# Patient Record
Sex: Female | Born: 1965 | Race: White | Hispanic: No | Smoking: Former smoker
Health system: Southern US, Community
[De-identification: ages and names within clinical notes are randomized; demographics above are authoritative.]

## PROBLEM LIST (undated history)

## (undated) DIAGNOSIS — M545 Low back pain, unspecified: Secondary | ICD-10-CM

## (undated) DIAGNOSIS — D649 Anemia, unspecified: Secondary | ICD-10-CM

## (undated) DIAGNOSIS — K259 Gastric ulcer, unspecified as acute or chronic, without hemorrhage or perforation: Secondary | ICD-10-CM

## (undated) DIAGNOSIS — K219 Gastro-esophageal reflux disease without esophagitis: Secondary | ICD-10-CM

## (undated) DIAGNOSIS — I209 Angina pectoris, unspecified: Secondary | ICD-10-CM

## (undated) DIAGNOSIS — M1990 Unspecified osteoarthritis, unspecified site: Secondary | ICD-10-CM

## (undated) DIAGNOSIS — R51 Headache: Secondary | ICD-10-CM

## (undated) DIAGNOSIS — R519 Headache, unspecified: Secondary | ICD-10-CM

## (undated) DIAGNOSIS — F419 Anxiety disorder, unspecified: Secondary | ICD-10-CM

## (undated) DIAGNOSIS — G43909 Migraine, unspecified, not intractable, without status migrainosus: Secondary | ICD-10-CM

## (undated) DIAGNOSIS — J45909 Unspecified asthma, uncomplicated: Secondary | ICD-10-CM

## (undated) DIAGNOSIS — G8929 Other chronic pain: Secondary | ICD-10-CM

## (undated) DIAGNOSIS — K5732 Diverticulitis of large intestine without perforation or abscess without bleeding: Secondary | ICD-10-CM

## (undated) HISTORY — PX: TONSILLECTOMY: SUR1361

## (undated) HISTORY — PX: BREAST SURGERY: SHX581

---

## 2006-04-28 ENCOUNTER — Encounter: Admission: RE | Admit: 2006-04-28 | Discharge: 2006-04-28 | Payer: Self-pay | Admitting: Family Medicine

## 2006-04-28 ENCOUNTER — Other Ambulatory Visit: Admission: RE | Admit: 2006-04-28 | Discharge: 2006-04-28 | Payer: Self-pay | Admitting: Obstetrics and Gynecology

## 2007-03-08 ENCOUNTER — Other Ambulatory Visit: Admission: RE | Admit: 2007-03-08 | Discharge: 2007-03-08 | Payer: Self-pay | Admitting: Obstetrics and Gynecology

## 2007-07-02 ENCOUNTER — Emergency Department (HOSPITAL_COMMUNITY): Admission: EM | Admit: 2007-07-02 | Discharge: 2007-07-02 | Payer: Self-pay | Admitting: Emergency Medicine

## 2008-05-04 ENCOUNTER — Emergency Department (HOSPITAL_COMMUNITY): Admission: EM | Admit: 2008-05-04 | Discharge: 2008-05-04 | Payer: Self-pay | Admitting: Emergency Medicine

## 2008-08-04 ENCOUNTER — Inpatient Hospital Stay (HOSPITAL_COMMUNITY): Admission: EM | Admit: 2008-08-04 | Discharge: 2008-08-06 | Payer: Self-pay | Admitting: Emergency Medicine

## 2008-08-05 ENCOUNTER — Encounter (INDEPENDENT_AMBULATORY_CARE_PROVIDER_SITE_OTHER): Payer: Self-pay | Admitting: Internal Medicine

## 2008-08-23 ENCOUNTER — Other Ambulatory Visit: Admission: RE | Admit: 2008-08-23 | Discharge: 2008-08-23 | Payer: Self-pay | Admitting: Obstetrics and Gynecology

## 2008-09-12 ENCOUNTER — Encounter (INDEPENDENT_AMBULATORY_CARE_PROVIDER_SITE_OTHER): Payer: Self-pay | Admitting: Gastroenterology

## 2008-09-12 ENCOUNTER — Ambulatory Visit (HOSPITAL_COMMUNITY): Admission: RE | Admit: 2008-09-12 | Discharge: 2008-09-12 | Payer: Self-pay | Admitting: Gastroenterology

## 2008-12-28 ENCOUNTER — Emergency Department (HOSPITAL_COMMUNITY): Admission: EM | Admit: 2008-12-28 | Discharge: 2008-12-28 | Payer: Self-pay | Admitting: Emergency Medicine

## 2009-01-30 ENCOUNTER — Ambulatory Visit (HOSPITAL_COMMUNITY): Admission: RE | Admit: 2009-01-30 | Discharge: 2009-01-30 | Payer: Self-pay | Admitting: Neurology

## 2009-05-12 ENCOUNTER — Other Ambulatory Visit (HOSPITAL_COMMUNITY): Admission: RE | Admit: 2009-05-12 | Discharge: 2009-05-23 | Payer: Self-pay | Admitting: Psychiatry

## 2009-05-14 ENCOUNTER — Ambulatory Visit: Payer: Self-pay | Admitting: Psychiatry

## 2010-05-12 ENCOUNTER — Ambulatory Visit (HOSPITAL_COMMUNITY): Admission: RE | Admit: 2010-05-12 | Discharge: 2010-05-12 | Payer: Self-pay | Admitting: Obstetrics

## 2010-05-14 ENCOUNTER — Encounter: Admission: RE | Admit: 2010-05-14 | Discharge: 2010-05-14 | Payer: Self-pay | Admitting: Obstetrics

## 2010-07-22 ENCOUNTER — Ambulatory Visit: Payer: Self-pay | Admitting: Interventional Radiology

## 2010-07-22 ENCOUNTER — Emergency Department (HOSPITAL_BASED_OUTPATIENT_CLINIC_OR_DEPARTMENT_OTHER): Admission: EM | Admit: 2010-07-22 | Discharge: 2010-07-22 | Payer: Self-pay | Admitting: Emergency Medicine

## 2010-08-25 ENCOUNTER — Emergency Department (HOSPITAL_COMMUNITY)
Admission: EM | Admit: 2010-08-25 | Discharge: 2010-08-25 | Payer: Self-pay | Source: Home / Self Care | Admitting: Emergency Medicine

## 2011-01-11 ENCOUNTER — Encounter: Payer: Self-pay | Admitting: Gastroenterology

## 2011-01-11 ENCOUNTER — Encounter: Payer: Self-pay | Admitting: Family Medicine

## 2011-03-04 LAB — URINALYSIS, ROUTINE W REFLEX MICROSCOPIC
Bilirubin Urine: NEGATIVE
Hgb urine dipstick: NEGATIVE
Nitrite: NEGATIVE
Protein, ur: NEGATIVE mg/dL
Specific Gravity, Urine: 1.004 — ABNORMAL LOW (ref 1.005–1.030)
Urobilinogen, UA: 0.2 mg/dL (ref 0.0–1.0)

## 2011-03-04 LAB — BASIC METABOLIC PANEL
CO2: 20 mEq/L (ref 19–32)
Calcium: 9.5 mg/dL (ref 8.4–10.5)
Chloride: 109 mEq/L (ref 96–112)
GFR calc Af Amer: >60 mL/min (ref >60)
GFR calc non Af Amer: >60 mL/min (ref >60)

## 2011-03-04 LAB — CBC
HCT: 38.6 % (ref 36.0–46.0)
Hemoglobin: 13.5 g/dL (ref 12.0–15.0)
MCV: 87.7 fL (ref 78.0–100.0)
WBC: 5.6 10*3/uL (ref 4.0–10.5)

## 2011-03-04 LAB — DIFFERENTIAL
Basophils Absolute: 0 10*3/uL (ref 0.0–0.1)
Eosinophils Relative: 1 % (ref 0–5)
Monocytes Absolute: 0.3 10*3/uL (ref 0.1–1.0)

## 2011-03-04 LAB — HEPATIC FUNCTION PANEL
ALT: 24 U/L (ref 0–35)
Bilirubin, Direct: 0.7 mg/dL — ABNORMAL HIGH (ref 0.0–0.3)
Indirect Bilirubin: 1.1 mg/dL — ABNORMAL HIGH (ref 0.3–0.9)

## 2011-03-05 LAB — COMPREHENSIVE METABOLIC PANEL
ALT: 20 U/L (ref 0–35)
Albumin: 4.2 g/dL (ref 3.5–5.2)
Alkaline Phosphatase: 43 U/L (ref 39–117)
CO2: 24 mEq/L (ref 19–32)
Calcium: 9.6 mg/dL (ref 8.4–10.5)
Chloride: 109 mEq/L (ref 96–112)
Potassium: 4 mEq/L (ref 3.5–5.1)
Total Bilirubin: 0.7 mg/dL (ref 0.3–1.2)
Total Protein: 7.4 g/dL (ref 6.0–8.3)

## 2011-03-05 LAB — DIFFERENTIAL
Eosinophils Absolute: 0 10*3/uL (ref 0.0–0.7)
Lymphocytes Relative: 26 % (ref 12–46)
Lymphs Abs: 1.6 10*3/uL (ref 0.7–4.0)
Monocytes Relative: 5 % (ref 3–12)
Neutro Abs: 4 10*3/uL (ref 1.7–7.7)
Neutrophils Relative %: 68 % (ref 43–77)

## 2011-03-05 LAB — CBC
HCT: 40.8 % (ref 36.0–46.0)
Hemoglobin: 14.3 g/dL (ref 12.0–15.0)
MCH: 31.1 pg (ref 26.0–34.0)
MCHC: 34.9 g/dL (ref 30.0–36.0)
MCV: 89.1 fL (ref 78.0–100.0)
Platelets: 216 10*3/uL (ref 150–400)
RDW: 12.3 % (ref 11.5–15.5)
WBC: 5.9 10*3/uL (ref 4.0–10.5)

## 2011-03-05 LAB — URINALYSIS, ROUTINE W REFLEX MICROSCOPIC
Glucose, UA: NEGATIVE mg/dL
Nitrite: NEGATIVE
Protein, ur: NEGATIVE mg/dL
Urobilinogen, UA: 0.2 mg/dL (ref 0.0–1.0)
pH: 7 (ref 5.0–8.0)

## 2011-03-05 LAB — PREGNANCY, URINE: Preg Test, Ur: NEGATIVE

## 2011-03-05 LAB — LIPASE, BLOOD: Lipase: 34 U/L (ref 23–300)

## 2011-04-05 LAB — DIFFERENTIAL
Basophils Absolute: 0 10*3/uL (ref 0.0–0.1)
Basophils Relative: 0 % (ref 0–1)
Eosinophils Absolute: 0 10*3/uL (ref 0.0–0.7)
Eosinophils Relative: 0 % (ref 0–5)
Monocytes Absolute: 0.5 10*3/uL (ref 0.1–1.0)

## 2011-04-05 LAB — URINALYSIS, ROUTINE W REFLEX MICROSCOPIC
Glucose, UA: NEGATIVE mg/dL
Ketones, ur: NEGATIVE mg/dL
Nitrite: NEGATIVE
pH: 7 (ref 5.0–8.0)

## 2011-04-05 LAB — COMPREHENSIVE METABOLIC PANEL
ALT: 27 U/L (ref 0–35)
AST: 26 U/L (ref 0–37)
Albumin: 4.4 g/dL (ref 3.5–5.2)
Alkaline Phosphatase: 34 U/L — ABNORMAL LOW (ref 39–117)
CO2: 24 mEq/L (ref 19–32)
Chloride: 106 mEq/L (ref 96–112)
GFR calc Af Amer: >60 mL/min (ref >60)
Potassium: 3.7 mEq/L (ref 3.5–5.1)
Total Bilirubin: 1.1 mg/dL (ref 0.3–1.2)

## 2011-04-05 LAB — CBC
HCT: 40.4 % (ref 36.0–46.0)
Platelets: 288 10*3/uL (ref 150–400)
RBC: 4.34 MIL/uL (ref 3.87–5.11)
WBC: 9.1 10*3/uL (ref 4.0–10.5)

## 2011-04-05 LAB — URINE MICROSCOPIC-ADD ON

## 2011-04-05 LAB — RAPID URINE DRUG SCREEN, HOSP PERFORMED
Amphetamines: NOT DETECTED
Benzodiazepines: POSITIVE — AB

## 2011-04-05 LAB — ETHANOL: Alcohol, Ethyl (B): <5 mg/dL (ref 0–10)

## 2011-04-06 LAB — COMPREHENSIVE METABOLIC PANEL
ALT: 28 U/L (ref 0–35)
BUN: 11 mg/dL (ref 6–23)
CO2: 31 mEq/L (ref 19–32)
Calcium: 9.2 mg/dL (ref 8.4–10.5)
Creatinine, Ser: 0.71 mg/dL (ref 0.4–1.2)
GFR calc non Af Amer: >60 mL/min (ref >60)
Glucose, Bld: 104 mg/dL — ABNORMAL HIGH (ref 70–99)
Sodium: 142 mEq/L (ref 135–145)

## 2011-04-06 LAB — VALPROIC ACID LEVEL: Valproic Acid Lvl: 50.3 ug/mL (ref 50.0–100.0)

## 2011-05-04 NOTE — Procedures (Signed)
EEG NUMBER:   HISTORY:  A 45 year old female, was admitted for diarrhea, abdominal  pain, dehydration, syncope, history of depression, and asthma.   CURRENT MEDICATIONS:  Zoloft, Protonix, Neurontin, Restoril, Cipro,  Flagyl, Xanax, Darvocet, Zofran, Tylenol, Dilaudid, and Benadryl.   TECHNICAL COMMENT:  An 18-channel EEG was performed based on standard  International 10-20 system.  Upon awakening, the posterior background  activity was well developed, symmetric, 10 Hz, reactive to eye opening  and closure.  There was no epileptiform discharge recorded.   Hyperventilation and photic stimulation was not performed.   The patient was drowsy during the recording, but no deeper stage of  sleep was achieved.   A 17-channel was added to EKG, which has demonstrated a normal sinus  rhythm of 84 beats per minute.  Total recording time was 22.1 minutes.   CONCLUSION:  This is a normal awake EEG.  There is no evidence of  epileptiform discharge.      Levert Feinstein, MD  Electronically Signed     EA:[id number]  D:  08/06/2008 16:15:28  T:  08/07/2008 12:40:40  Job #:  [id number]

## 2011-05-04 NOTE — Consult Note (Signed)
NAMEDONALYN, SCHNEEBERGER               ACCOUNT NO.:  [id number]   MEDICAL RECORD NO.:  [id number]          PATIENT TYPE:  INP   LOCATION:  1431                         FACILITY:  [facility]   PHYSICIAN:  Antonietta Breach, M.D.  DATE OF BIRTH:  07/21/1966   DATE OF CONSULTATION:  08/05/2008  DATE OF DISCHARGE:                                 CONSULTATION   REFERRING PHYSICIAN:  [facility], [facility].   REASON FOR CONSULTATION:  Severe anxiety, somatic concerns without  identifiable physiologic etiology.   HISTORY OF PRESENT ILLNESS:  Mrs. Burggraf is a 45 year old married  female admitted to the [facility] on August 03, 2008 with  diarrhea, abdominal pain, weakness and dehydration, as well as chest  pain.   Mrs. Cobin discusses several weeks of much worry, insomnia, feeling on  edge and muscle tension regarding the stress of identity thief.   She also identifies stresses, having diarrhea and suspecting that she  has parasites in her stool.  She also states that she is very worried  about her heart.  She states that she had an echocardiogram a number of  years ago that described valve problems.  She is concerned that they  have worsened.   Mrs. Mahmood does describe normal interests, intact hope and future  goals.  She does not have any thoughts of harming herself or others.  She has no delusions or hallucinations   PAST PSYCHIATRIC HISTORY:  Mrs. Lemar does describe a history of major  depression with response to Zoloft.   In review of the past medical record, depression is listed.  Also prior  to admission, Mrs. Fiumara had been restarted on Zoloft 25 mg per day  during the week prior to admission.  Her diarrhea had already been  occurring for a number of weeks before the Zoloft was started.   She has been receiving Restoril p.r.n. for insomnia.  She states that  Benadryl helps.   FAMILY PSYCHIATRIC HISTORY:  None known.   SOCIAL HISTORY:  Mrs. Dierolf states that she and  her husband are  receiving marital counseling at her church.  She does not use alcohol or  illegal drugs.  She has a child.  Please see the history above.   PAST MEDICAL HISTORY:  Please see the above.  Stool for ova and  parasites was negative.  Mrs. Isidoro has a history of asthma.   MEDICATIONS:  The MAR is reviewed.  Mrs. Openshaw is on Neurontin 100 mg  t.i.d.  She is on Zoloft 25 mg daily.  Restoril 7.5 mg nightly p.r.n.,  Xanax 0.25 mg q.8 h. p.r.n.   LABORATORY DATA:  Sodium 140,  potassium 3.5,  BUN 4, creatinine 0.8,  glucose 100 WBC 4.2, hemoglobin 10.7, platelet count 152, SGOT 19, SGPT  17, HCG negative.  Alcohol negative.   ALLERGIES:  CODEINE, MORPHINE SULFATE, DILAUDID.   REVIEW OF SYSTEMS:  CONSTITUTIONAL,  HEAD, EYES, EARS, NOSE AND THROAT,  MOUTH, NEUROLOGIC, PSYCHIATRIC CARDIOVASCULAR, RESPIRATORY,  GASTROINTESTINAL, GENITOURINARY, SKIN, MUSCULOSKELETAL, HEMATOLOGIC,  LYMPHATIC, ENDOCRINE, METABOLIC:  All unremarkable.   PHYSICAL EXAMINATION:  VITAL SIGNS:  Temperature 98.8, pulse 90,  respiratory rate 20, blood pressure 102/67, O2 saturation on room air  100%.  GENERAL APPEARANCE:  Mrs. Heimann is a middle-aged female reclining in  her hospital bed in a supine position with no abnormal involuntary  movements.   MENTAL STATUS EXAM:  Mrs. of Arlys John is alert.  Her eye contact is good.  She is oriented to all spheres.   Her concentration is within normal limits.  Her affect is anxious.  Her  mood is anxious.  Her memory is intact to immediate, recent and remote.  Her fund of knowledge and intelligence are within normal limits.  Her  speech involves normal rate and prosody.   REVIEW OF SYSTEMS:  71 without dysarthria.   Thought process is logical, coherent, goal-directed.  No looseness of  associations.  Thought content no thoughts of harming herself, no  thoughts of harming others no delusions or hallucinations.  Her insight  is partial for anxiety.  Judgment  is within normal limits.   ASSESSMENT:  AXIS I:  293.84.  Anxiety disorder not otherwise specified.  Generalized anxiety disorder, rule out panic disorder.  AXIS II:  Deferred.  AXIS III:  See past medical history.  AXIS IV:  Primary support group.  AXIS V:  55.   Mrs. Stofer is not at risk to harm to self or others.  She agrees to  call emergency services immediately for any thoughts of harming herself,  thoughts of harming others, or distress.   The undersigned provided ego supportive psychotherapy and education.   The indications, alternatives and adverse effects of Zoloft and Xanax as  well as Restoril and Benadryl were discussed with the patient including  the risk of dependence.   Mrs. O'Brien understands.  She would like to proceed as below.   Would titrate the Zoloft by 25 mg per week, as tolerated to a trial dose  of 100 mg q.a.m. for antianxiety, with the goal of Mrs. O'Brien  eventually not needing any benzodiazepine therapy.   Mrs. Wilms does not want to use Xanax for her anti-insomnia agent.  She would prefer to utilize Benadryl 25 to 50 mg nightly, would utilize  the Restoril only if needed.   Mrs. Fiscus understands to not drive if drowsy.   Would utilize Xanax at 0.25 to 1.5 mg t.i.d. p.r.n. severe anxiety.   RECOMMENDATIONS:  Would enroll Mrs. O'Brien in psychotherapy, as well as  psychotropic medication management with a therapist and psychiatrist of  her choice.   Would set this up during the first week of discharge.  Her psychotherapy  can involve cognitive behavioral therapy with deep breathing and  progressive muscle relaxation training, coping skills and stress  management can also be applied.   A psychiatrist can provide psychotropic medication management with the  goal of the eliminating all benzodiazepine therapy eventually.      Antonietta Breach, M.D.  Electronically Signed     JW/MEDQ  D:  08/05/2008  T:  08/05/2008  Job:  [id number]

## 2011-05-04 NOTE — Op Note (Signed)
NAMEJOYDAN, Deborah Pena               ACCOUNT NO.:  [id number]   MEDICAL RECORD NO.:  [id number]          PATIENT TYPE:  AMB   LOCATION:  ENDO                         FACILITY[facility]  [facility]   PHYSICIAN:  Shirley Friar, MDDATE OF BIRTH:  [birth year]   DATE OF PROCEDURE:  DATE OF DISCHARGE:  09/12/2008                               OPERATIVE REPORT   PROCEDURE:  Colonoscopy.   INDICATIONS:  Diarrhea, rectal bleeding, weight loss.   MEDICATIONS:  Per Anesthesia; propofol 180 mg IV, fentanyl 150 mcg,  Versed 4 mg IV.   FINDINGS:  Rectal exam was normal.   DESCRIPTION OF PROCEDURE:  A pediatric Pentax colonoscope was inserted  into a fair prepped colon and advanced to the cecum where the ileocecal  valve and appendiceal orifice were identified.  The terminal ileum was  intubated and was normal in appearance.  Biopsies were taken in the  terminal ileum for histology.  On careful withdrawal of the colonoscope,  random biopsies were taken in the colon.  Retroflexion was normal.   ASSESSMENT:  Normal colonoscopy, status post biopsies of colon and  terminal ileum for histology.      Shirley Friar, MD  Electronically Signed     VCS/MEDQ  D:  09/12/2008  T:  09/14/2008  Job:  [id number]

## 2011-05-04 NOTE — H&P (Signed)
NAMEMANDA, Deborah Pena               ACCOUNT NO.:  [id number]   MEDICAL RECORD NO.:  [id number]          PATIENT TYPE:  INP   LOCATION:  1431                         FACILITY:  [facility]   PHYSICIAN:  Michelene Gardener, MD    DATE OF BIRTH:  Nov 04, 1966   DATE OF ADMISSION:  08/04/2008  DATE OF DISCHARGE:                              HISTORY & PHYSICAL   PRIMARY CARE PHYSICIAN:  Unassigned.   CHIEF COMPLAINT:  Diarrhea for 3 weeks, abdominal pain and chest pain  and near syncope.   HISTORY OF PRESENT ILLNESS:  This is 45 year old, Caucasian female with  past medical history of depression and asthma, presented with the above-  mentioned complaint.  The patient has had diarrhea for the last 3 weeks,  first 2 weeks she got at least 8-9 episodes of diarrhea.  For the last  one week, she has been having more frequent diarrhea where she got it  approximately every half an hour.  Initially she was watery diarrhea.  Now it has become more formed, and she had some blood on it.  She was  also complaining of abdominal pain especially in the left lower  quadrant.  She has been having nausea, but there is no vomiting.  Today,  she was feeling more palpitations.  She develops chest pain.  She was  also almost passed out where she collapsed, but never lost her  consciousness, and that happened twice, yesterday and today.  After her  collapse today, 9-1-1 was called by her mom and the patient was brought  to the hospital for further evaluation.   PAST MEDICAL HISTORY:  1. Asthma.  2. Allergic seasonal allergies.   PAST SURGICAL HISTORY:  Denied.   ALLERGIES:  CODEINE AND MORPHINE.   CURRENT MEDICATIONS:  1. Zoloft 25 mg once a day.  2. Restoril as needed.  3. Neurontin 100 mg twice daily.   SOCIAL HISTORY:  She quit smoking around 10 years ago.  She denies  alcohol, drinking.  Denies recreational drugs.   FAMILY HISTORY:  Her mother is deaf and has hypertension.  She does not  know anything  about her dad's history.   REVIEW OF SYSTEMS:  Twelve point systems were reviewed and they were  negative and explained as per HPI.   PHYSICAL EXAMINATION:  VITAL SIGNS: Temperature is 98.9, blood pressure  is 120/86, pulse 84, respiratory rate 16.  GENERAL APPEARANCE:  This is a middle-aged Caucasian female not in acute  distress.  HEENT:  Conjunctivae are pink.  Her pupils are equal and reactive to  light and accommodations.  There is no ptosis.  Hearing is intact.  There is no ear discharge or infection.  There is no nose infection or  bleeding.  Oral mucosa dry.  No pharyngeal erythema.  NECK:  Supple, no JVD, no carotid bruit, no lymphadenopathy.  No thyroid  enlargement or thyroid tenderness.  CARDIOVASCULAR:  S1, S2 regular.  There is no murmurs.  No gallops or  rubs.  RESPIRATORY:  The patient is breathing between 16-18.  There are no  rales, rhonchi or wheezes.  ABDOMEN:  Soft, not distended. No tenderness, no  hepatosplenomegaly.Bowel sounds are present.  LOWER EXTREMITIES:  No edema or varicose veins.  SKIN:  No rash and erythema.  NEUROLOGICAL:  Cranial nerves are intact.  No motor or sensory deficits.   LABORATORY DATA:  WBCs 5.5, hemoglobin at 12.8, hematocrit 37.3, MCV  92.1, platelet count is 219.  Sodium 142, potassium 3.6, chloride 108,  bicarb 27, glucose 95, BUN 3, creatinine 0.81, total protein 6.8.   IMPRESSION:  1. Diarrhea.  2. Abdominal pain.  3. Dehydration.  4. Chest pain and palpitations.  5. Near syncope.  6. Depression.  7. Anxiety.   PLAN:  1. Diarrhea.  This patient has had diarrhea for 3 weeks, but she does      not know why.  In the last one, she has been having more frequent      diarrhea.  She started to develop some pain with it.  I will keep      her n.p.o.  Will start her on IV fluids.  Will get CT scan of her      abdomen and pelvis.  I will start her on ciprofloxacin 400 mg q.12      hours.  Will start her also Flagyl 500 mg q.8 h.   Will send for      stool studies including stool culture, stool ova and parasite and      stool C. difficile.  2. Abdominal pain.  Etiology is unclear at this point.  I will get a      CT scan of the abdomen and pelvis for further evaluation.  3. Dehydration.  Continue on IV Fluids.  4. Chest pain, now palpitations.  As per the patient's history, has      history of 2 leaking valves in the past.  She was given a      prescription by her doctor for that as she has been noncompliant.      Now, she has been having more chest pain in the last 2 days which      was not very specific.  I will get three sets of troponin and      cardiac enzymes.  Will do echocardiogram.  5. Near-syncope.  This is most likely secondary to transient      hypotension with her excessive loss of fluids through the diarrhea.      Will just resuscitate her with IV fluids and will follow her in the      hospital.  6. Depression/Anxiety.  Will continue current medications.   ASSESSMENT TIME:  One hour.      Michelene Gardener, MD  Electronically Signed     NAE/MEDQ  D:  08/04/2008  T:  08/04/2008  Job:  [id number]

## 2011-05-04 NOTE — Op Note (Signed)
Deborah Pena, Deborah Pena               ACCOUNT NO.:  [id number]   MEDICAL RECORD NO.:  [id number]          PATIENT TYPE:  AMB   LOCATION:  ENDO                         [facility]ACILITY:  [facility]   PHYSICIAN:  Shirley Friar, MDDATE OF BIRTH:  [birth year]   DATE OF PROCEDURE:  09/12/2008  DATE OF DISCHARGE:  09/12/2008                               OPERATIVE REPORT   PROCEDURE:  Upper endoscopy dictation.   INDICATIONS:  Diarrhea, rectal bleeding, weight loss.   MEDICATIONS:  See preceding colonoscopy.  IV sedation was given by  Anesthesia.   FINDINGS:  Endoscope was inserted through oropharynx and esophagus was  intubated which was normal in its entirety.  Endoscope was passed down  into the stomach which revealed normal stomach mucosa.  Retroflexion  done which revealed normal proximal stomach.  The scope was straightened  and advanced to the duodenal bulb and down to the second portion of  duodenum where there was noted beats scattered edematous folds.  There  were biopsied.  The endoscope was withdrawn back to the duodenal bulb  there was some edema in the distal bulb as well and biopsy was taken for  the histology.  Endoscope was withdrawn to confirm the above findings.   ASSESSMENT:  1. Scattered edematous folds in examined duodenum, status post biopsy      for histology.  2. Otherwise normal upper endoscopy.   PLAN:  Follow up on path.      Shirley Friar, MD  Electronically Signed     VCS/MEDQ  D:  09/12/2008  T:  09/14/2008  Job:  [id number]

## 2011-05-04 NOTE — Consult Note (Signed)
NAMEMARIXA, Deborah Pena               ACCOUNT NO.:  [id number]   MEDICAL RECORD NO.:  [id number]          PATIENT TYPE:  INP   LOCATION:  1431                         FACILITY:  [facility]   PHYSICIAN:  Antonietta Breach, M.D.  DATE OF BIRTH:  Oct 16, 1966   DATE OF CONSULTATION:  08/06/2008  DATE OF DISCHARGE:                                 CONSULTATION   HISTORY OF PRESENT ILLNESS:  Deborah Pena is less anxious today.  She is  cooperative with bedside care.  She underwent an electroencephalogram  this morning.   She has less feeling on edge.  She is also less worried.  She is  discussing hope and interests.   REVIEW OF SYSTEMS:  GASTROINTESTINAL:  She is not having any diarrhea.  She had 10 mL of loose stool.  She is not having any complaints of  nausea.  CARDIOVASCULAR:  There have been no syncopal episodes or  symptoms.   PSYCHIATRIC:  Deborah Pena provide additional past psychiatric history.  She reports that 50 mg of Zoloft per day helped with her premenstrual  syndrome symptoms.  Also, she underwent a trial of venlafaxine titrated  to 225 mg daily which resulted in increasing her anxiety and not helping  her.   PHYSICAL EXAMINATION:  VITAL SIGNS:  Temperature 97.6, pulse 72,  respiratory rate 18, blood pressure 102/62, O2 saturation on room air  97%.   MENTAL STATUS EXAM:  Deborah Pena is alert.  Her eye contact is good.  She is oriented to all spheres.  Affect slightly anxious.  Mood slightly  anxious, but interests and hope intact.  Memory is intact to immediate  recent and remote.  Speech is normal.  Thought process logical,  coherent, goal-directed.  No looseness of associations.  Thought  content:  No thoughts of harming others no thoughts of harming herself,  no delusions, no hallucinations.  Insight is intact for her anxiety  symptoms.  Judgment is intact.   ASSESSMENT:  AXIS I:  293.84 anxiety disorder not otherwise specified,  generalized anxiety disorder.   The  undersigned provided ego supportive psychotherapy and education.   RECOMMENDATIONS:  Would continue with the Zoloft 25 mg daily and titrate  by 25 mg per week to the initial target dose of 100 mg daily which when  combined with cognitive behavioral therapy, deep breathing and  progressive muscle relaxation will have the best chance of eliminating  her need for benzodiazepine therapy over the long-term.   Deborah Pena declines the recommendation for intensive outpatient  psychiatric programs.  She wants to continue with her current  psychiatrist and counselor for follow up.      Antonietta Breach, M.D.  Electronically Signed     JW/MEDQ  D:  08/06/2008  T:  08/06/2008  Job:  [id number]

## 2011-10-05 LAB — DIFFERENTIAL
Basophils Relative: 0
Eosinophils Absolute: 0.1
Monocytes Absolute: 0.4
Monocytes Relative: 8
Neutrophils Relative %: 53

## 2011-10-05 LAB — COMPREHENSIVE METABOLIC PANEL
ALT: 16
Albumin: 4.7
Alkaline Phosphatase: 23 — ABNORMAL LOW
Glucose, Bld: 85
Potassium: 4
Sodium: 141
Total Protein: 7.2

## 2011-10-05 LAB — URINALYSIS, ROUTINE W REFLEX MICROSCOPIC
Bilirubin Urine: NEGATIVE
Glucose, UA: NEGATIVE
Hgb urine dipstick: NEGATIVE
Ketones, ur: NEGATIVE
pH: 7.5

## 2011-10-05 LAB — CBC
Hemoglobin: 14
RDW: 12.2
WBC: 4.7

## 2011-10-06 ENCOUNTER — Emergency Department (HOSPITAL_COMMUNITY)
Admission: EM | Admit: 2011-10-06 | Discharge: 2011-10-06 | Disposition: A | Discharge status: Home / Self Care | Payer: Managed Care, Other (non HMO) | Attending: Emergency Medicine | Admitting: Emergency Medicine

## 2011-10-06 DIAGNOSIS — R112 Nausea with vomiting, unspecified: Secondary | ICD-10-CM | POA: Insufficient documentation

## 2011-10-06 DIAGNOSIS — R51 Headache: Secondary | ICD-10-CM | POA: Insufficient documentation

## 2011-12-21 HISTORY — PX: ABDOMINAL HYSTERECTOMY: SHX81

## 2011-12-21 HISTORY — PX: OOPHORECTOMY: SHX86

## 2011-12-21 HISTORY — PX: BUNIONECTOMY: SHX129

## 2012-05-26 ENCOUNTER — Ambulatory Visit (HOSPITAL_COMMUNITY): Payer: Managed Care, Other (non HMO) | Admitting: Psychiatry

## 2012-07-03 DIAGNOSIS — F1310 Sedative, hypnotic or anxiolytic abuse, uncomplicated: Secondary | ICD-10-CM | POA: Insufficient documentation

## 2012-08-21 ENCOUNTER — Ambulatory Visit (INDEPENDENT_AMBULATORY_CARE_PROVIDER_SITE_OTHER): Payer: Managed Care, Other (non HMO) | Admitting: Family Medicine

## 2012-08-21 ENCOUNTER — Emergency Department (HOSPITAL_COMMUNITY): Payer: Managed Care, Other (non HMO)

## 2012-08-21 ENCOUNTER — Emergency Department (HOSPITAL_COMMUNITY)
Admission: EM | Admit: 2012-08-21 | Discharge: 2012-08-21 | Disposition: A | Discharge status: Home / Self Care | Payer: Managed Care, Other (non HMO) | Attending: Emergency Medicine | Admitting: Emergency Medicine

## 2012-08-21 ENCOUNTER — Encounter (HOSPITAL_COMMUNITY): Payer: Self-pay

## 2012-08-21 VITALS — BP 110/86 | HR 100 | Temp 97.8°F | Resp 16 | Ht 63.0 in | Wt 235.0 lb

## 2012-08-21 DIAGNOSIS — R109 Unspecified abdominal pain: Secondary | ICD-10-CM

## 2012-08-21 DIAGNOSIS — M549 Dorsalgia, unspecified: Secondary | ICD-10-CM | POA: Insufficient documentation

## 2012-08-21 DIAGNOSIS — R350 Frequency of micturition: Secondary | ICD-10-CM

## 2012-08-21 DIAGNOSIS — IMO0001 Reserved for inherently not codable concepts without codable children: Secondary | ICD-10-CM

## 2012-08-21 DIAGNOSIS — R10819 Abdominal tenderness, unspecified site: Secondary | ICD-10-CM | POA: Insufficient documentation

## 2012-08-21 DIAGNOSIS — R110 Nausea: Secondary | ICD-10-CM | POA: Insufficient documentation

## 2012-08-21 HISTORY — DX: Diverticulitis of large intestine without perforation or abscess without bleeding: K57.32

## 2012-08-21 LAB — BASIC METABOLIC PANEL
Calcium: 9.3 mg/dL (ref 8.4–10.5)
GFR calc Af Amer: >90 mL/min (ref >90)
GFR calc non Af Amer: 86 mL/min — ABNORMAL LOW (ref >90)
Potassium: 4 mEq/L (ref 3.5–5.1)
Sodium: 141 mEq/L (ref 135–145)

## 2012-08-21 LAB — POCT URINALYSIS DIPSTICK
Bilirubin, UA: NEGATIVE
Blood, UA: NEGATIVE
Glucose, UA: NEGATIVE
Ketones, UA: NEGATIVE
Leukocytes, UA: NEGATIVE
Nitrite, UA: NEGATIVE
Protein, UA: NEGATIVE
Spec Grav, UA: <=1.005
Urobilinogen, UA: 0.2
pH, UA: 7

## 2012-08-21 LAB — URINALYSIS, ROUTINE W REFLEX MICROSCOPIC
Nitrite: NEGATIVE
Protein, ur: NEGATIVE mg/dL
Specific Gravity, Urine: 1.003 — ABNORMAL LOW (ref 1.005–1.030)
Urobilinogen, UA: 0.2 mg/dL (ref 0.0–1.0)

## 2012-08-21 LAB — CBC WITH DIFFERENTIAL/PLATELET
Basophils Absolute: 0 10*3/uL (ref 0.0–0.1)
Basophils Relative: 0 % (ref 0–1)
Hemoglobin: 13.3 g/dL (ref 12.0–15.0)
MCHC: 33.8 g/dL (ref 30.0–36.0)
Neutro Abs: 3.5 10*3/uL (ref 1.7–7.7)
Neutrophils Relative %: 58 % (ref 43–77)
Platelets: 220 10*3/uL (ref 150–400)
RDW: 12.4 % (ref 11.5–15.5)

## 2012-08-21 LAB — POCT UA - MICROSCOPIC ONLY
Casts, Ur, LPF, POC: NEGATIVE
Crystals, Ur, HPF, POC: NEGATIVE
Mucus, UA: NEGATIVE
RBC, urine, microscopic: NEGATIVE
WBC, Ur, HPF, POC: NEGATIVE
Yeast, UA: NEGATIVE

## 2012-08-21 LAB — POCT GLYCOSYLATED HEMOGLOBIN (HGB A1C): Hemoglobin A1C: 5.1

## 2012-08-21 MED ORDER — SODIUM CHLORIDE 0.9 % IV BOLUS (SEPSIS)
1000.0000 mL | Freq: Once | INTRAVENOUS | Status: AC
  Administered 2012-08-21: 1000 mL via INTRAVENOUS

## 2012-08-21 MED ORDER — FENTANYL CITRATE 0.05 MG/ML IJ SOLN
50.0000 ug | Freq: Once | INTRAMUSCULAR | Status: AC
  Administered 2012-08-21: 50 ug via INTRAVENOUS
  Filled 2012-08-21 (×2): qty 2

## 2012-08-21 MED ORDER — FENTANYL CITRATE 0.05 MG/ML IJ SOLN
50.0000 ug | Freq: Once | INTRAMUSCULAR | Status: AC
  Administered 2012-08-21: 50 ug via INTRAVENOUS
  Filled 2012-08-21: qty 2

## 2012-08-21 MED ORDER — ONDANSETRON HCL 4 MG/2ML IJ SOLN
4.0000 mg | Freq: Once | INTRAMUSCULAR | Status: AC
  Administered 2012-08-21: 4 mg via INTRAVENOUS
  Filled 2012-08-21: qty 2

## 2012-08-21 MED ORDER — OXYCODONE-ACETAMINOPHEN 5-325 MG PO TABS: 2.0000 | ORAL_TABLET | ORAL | Status: AC | PRN

## 2012-08-21 MED ORDER — ONDANSETRON HCL 4 MG/2ML IJ SOLN
INTRAMUSCULAR | Status: AC
  Filled 2012-08-21: qty 2

## 2012-08-21 NOTE — Progress Notes (Signed)
46 yo with 4 months of left sided abdominal pain following a vaginal hysterectomy (robotic).  About  4 days ago the pain became intense and made her cry out in pain. No constipation or diarrhea. She has had fever, chills, polyuria. Also patient is becoming exhaused  Married, she is homeschooling her daughter  PMHx:  Diverticulitis  Objective:  Appears in mild distress and is quite weak, having trouble focusing on conversation Mild left CVA tenderness  Results for orders placed in visit on 08/21/12  POCT URINALYSIS DIPSTICK      Component Value Range   Color, UA yellow     Clarity, UA clear     Glucose, UA neg     Bilirubin, UA neg     Ketones, UA neg     Spec Grav, UA <=1.005     Blood, UA neg     pH, UA 7.0     Protein, UA neg     Urobilinogen, UA 0.2     Nitrite, UA neg     Leukocytes, UA Negative     Chest clear Abdomen:  Tender left side, no mass felt.  Patient has pain with left straight leg raising.  Marked guarding and rebound left side  During exam, patient became somewhat faint and was laid down and IV started.  Her palpable blood pressure on the left was 80 mm Hg.  Assessment:  Left flank pain, worsening, accompanied by fever and chills suggests occult infection, and the acute horrible pain she experienced 4 days ago is consistent with ruptured abscess.  Plan:  Ambulance ride to ED for CT and full workup.

## 2012-08-21 NOTE — ED Notes (Signed)
Vital signs stable. 

## 2012-08-21 NOTE — ED Provider Notes (Signed)
History     CSN: [id number]  Arrival date & time 08/21/12  1052   First MD Initiated Contact with Patient 08/21/12 1110      Chief Complaint  Patient presents with  . Abdominal Pain  . Hypotension    (Consider location/radiation/quality/duration/timing/severity/associated sxs/prior treatment) HPI Comments: Patient presents with four-day history of pain to her left flank. She says been constant but waxing and waning over last 4 days. It starts in her left midabdomen and goes around to her left mid back. She's had some urinary frequency but no burning on urination. No noticeable hematuria. She's had some nausea but no vomiting. She's had some lightheaded feeling. She denies any diarrhea. She's having normal bowel movements. Denies any fevers or chills. She was seen at [facility] urgent care prior to arrival was noted to be hypotensive with a blood pressure in 80s. Was given IV fluids by EMS and on arrival her blood pressure was 110 systolic.  Patient is a 46 y.o. female presenting with abdominal pain. The history is provided by the patient.  Abdominal Pain The primary symptoms of the illness include abdominal pain and nausea. The primary symptoms of the illness do not include fever, fatigue, shortness of breath, vomiting, diarrhea, vaginal discharge or vaginal bleeding.  Additional symptoms associated with the illness include frequency. Symptoms associated with the illness do not include chills, diaphoresis, hematuria or back pain.    Past Medical History  Diagnosis Date  . Diverticulitis of colon     Past Surgical History  Procedure Date  . Abdominal hysterectomy     preformed at [facility] by Dr Ardelle Anton in April    No family history on file.  History  Substance Use Topics  . Smoking status: Former Smoker    Types: Cigarettes  . Smokeless tobacco: Not on file  . Alcohol Use: No    OB History    Grav Para Term Preterm Abortions TAB SAB Ect Mult Living                   Review of Systems  Constitutional: Negative for fever, chills, diaphoresis and fatigue.  HENT: Negative for congestion, rhinorrhea and sneezing.   Eyes: Negative.   Respiratory: Negative for cough, chest tightness and shortness of breath.   Cardiovascular: Negative for chest pain and leg swelling.  Gastrointestinal: Positive for nausea and abdominal pain. Negative for vomiting, diarrhea and blood in stool.  Genitourinary: Positive for frequency. Negative for hematuria, flank pain, vaginal bleeding, vaginal discharge and difficulty urinating.  Musculoskeletal: Negative for back pain and arthralgias.  Skin: Negative for rash.  Neurological: Negative for dizziness, speech difficulty, weakness, numbness and headaches.    Allergies  Codeine and Morphine and related  Home Medications   Current Outpatient Rx  Name Route Sig Dispense Refill  . ALPRAZOLAM 2 MG PO TABS Oral Take 2 mg by mouth at bedtime as needed. For stress    . IBUPROFEN 200 MG PO TABS Oral Take 200 mg by mouth every 6 (six) hours as needed. For pain    . MULTIVITAMIN PO Oral Take 1 tablet by mouth daily.    Marland Kitchen ZOLPIDEM TARTRATE 10 MG PO TABS Oral Take 10 mg by mouth at bedtime as needed.    . OXYCODONE-ACETAMINOPHEN 5-325 MG PO TABS Oral Take 2 tablets by mouth every 4 (four) hours as needed for pain. 15 tablet 0    BP 105/57  Pulse 71  Temp 98.3 F (36.8 C) (Oral)  Resp 16  SpO2 100%  Physical Exam  Constitutional: She is oriented to person, place, and time. She appears well-developed and well-nourished.  HENT:  Head: Normocephalic and atraumatic.  Eyes: Pupils are equal, round, and reactive to light.  Neck: Normal range of motion. Neck supple.  Cardiovascular: Normal rate, regular rhythm and normal heart sounds.   Pulmonary/Chest: Effort normal and breath sounds normal. No respiratory distress. She has no wheezes. She has no rales. She exhibits no tenderness.  Abdominal: Soft. Bowel sounds are normal. There  is tenderness (Moderate tenderness to the left midabdomen and left flank area.). There is no rebound and no guarding.  Musculoskeletal: Normal range of motion. She exhibits no edema.  Lymphadenopathy:    She has no cervical adenopathy.  Neurological: She is alert and oriented to person, place, and time.  Skin: Skin is warm and dry. No rash noted.  Psychiatric: She has a normal mood and affect.    ED Course  Procedures (including critical care time)  Results for orders placed during the hospital encounter of 08/21/12  CBC WITH DIFFERENTIAL      Component Value Range   WBC 6.0  4.0 - 10.5 K/uL   RBC 4.43  3.87 - 5.11 MIL/uL   Hemoglobin 13.3  12.0 - 15.0 g/dL   HCT 57.8  46.9 - 62.9 %   MCV 88.9  78.0 - 100.0 fL   MCH 30.0  26.0 - 34.0 pg   MCHC 33.8  30.0 - 36.0 g/dL   RDW 52.8  41.3 - 24.4 %   Platelets 220  150 - 400 K/uL   Neutrophils Relative 58  43 - 77 %   Neutro Abs 3.5  1.7 - 7.7 K/uL   Lymphocytes Relative 33  12 - 46 %   Lymphs Abs 2.0  0.7 - 4.0 K/uL   Monocytes Relative 6  3 - 12 %   Monocytes Absolute 0.4  0.1 - 1.0 K/uL   Eosinophils Relative 3  0 - 5 %   Eosinophils Absolute 0.2  0.0 - 0.7 K/uL   Basophils Relative 0  0 - 1 %   Basophils Absolute 0.0  0.0 - 0.1 K/uL  URINALYSIS, ROUTINE W REFLEX MICROSCOPIC      Component Value Range   Color, Urine YELLOW  YELLOW   APPearance CLEAR  CLEAR   Specific Gravity, Urine 1.003 (*) 1.005 - 1.030   pH 7.0  5.0 - 8.0   Glucose, UA NEGATIVE  NEGATIVE mg/dL   Hgb urine dipstick NEGATIVE  NEGATIVE   Bilirubin Urine NEGATIVE  NEGATIVE   Ketones, ur NEGATIVE  NEGATIVE mg/dL   Protein, ur NEGATIVE  NEGATIVE mg/dL   Urobilinogen, UA 0.2  0.0 - 1.0 mg/dL   Nitrite NEGATIVE  NEGATIVE   Leukocytes, UA NEGATIVE  NEGATIVE  BASIC METABOLIC PANEL      Component Value Range   Sodium 141  135 - 145 mEq/L   Potassium 4.0  3.5 - 5.1 mEq/L   Chloride 104  96 - 112 mEq/L   CO2 27  19 - 32 mEq/L   Glucose, Bld 93  70 - 99 mg/dL    BUN 9  6 - 23 mg/dL   Creatinine, Ser 0.10  0.50 - 1.10 mg/dL   Calcium 9.3  8.4 - 27.2 mg/dL   GFR calc non Af Amer 86 (*) >90 mL/min   GFR calc Af Amer >90  >90 mL/min   Ct Abdomen Pelvis Wo Contrast  08/21/2012  *RADIOLOGY REPORT*  Clinical Data: Left flank pain.  CT ABDOMEN AND PELVIS WITHOUT CONTRAST  Technique:  Multidetector CT imaging of the abdomen and pelvis was performed following the standard protocol without intravenous contrast.  Comparison: 07/22/2010  Findings: Visualized lung bases clear.  Unremarkable uninfused evaluation of the liver, nondistended gallbladder, spleen, adrenal glands, kidneys, pancreas, abdominal aorta, stomach, small bowel. Appendix not discretely identified.  Colon is nondilated, unremarkable.  Urinary bladder incompletely distended.  Bilateral pelvic phleboliths.  Previous hysterectomy.  No ascites.  No free air.  No pelvic, retroperitoneal, or mesenteric adenopathy is evident.  No nephrolithiasis or hydronephrosis.  Regional bones unremarkable.  IMPRESSION:  1.  Negative for nephrolithiasis, hydronephrosis, or other acute abnormality.   Original Report Authenticated By: Thora Lance III, M.D.       1. Back pain       MDM  Patient is feeling better after IV fluids and pain medications. She's had no episodes of hypotension here in emergency department. She was able to ambulate with no dizziness or hypotension. Her CT scan does not demonstrate any signs of kidney stone or pyelonephritis. Her urine does not appear infected. Her white count is normal which would not suggest other infection. Her CT scan does not demonstrate any other abdominal etiology for the pain. Given this, I feel this is likely musculoskeletal. I'm not sure the etiology of her previous hypotension however it could of been a vasovagal response to the pain. I did counsel the patient on having close followup with her primary care provider at [facility] urgent care for the next 2 days. Advised  to return here if she has any worsening symptoms. I also did send her urine for culture.        Rolan Bucco, MD 08/21/12 1435

## 2012-08-21 NOTE — ED Notes (Signed)
Ambulates with no difficulty - remains pain free

## 2012-08-21 NOTE — ED Notes (Signed)
Pt received 4mg  zofran at urgent care

## 2012-08-21 NOTE — ED Notes (Signed)
Pt seen at [facility] PTA.  Staff reported to EMS that pt had bp in low 80"s systolic.  EMS reports BP of 90/62 upon their arrival to urgent care.  Fluids started prior to transport

## 2012-08-22 LAB — URINE CULTURE

## 2012-08-24 ENCOUNTER — Ambulatory Visit (INDEPENDENT_AMBULATORY_CARE_PROVIDER_SITE_OTHER): Payer: Managed Care, Other (non HMO) | Admitting: Emergency Medicine

## 2012-08-24 VITALS — BP 100/70 | HR 76 | Temp 98.3°F | Resp 16 | Ht 62.5 in | Wt 235.8 lb

## 2012-08-24 DIAGNOSIS — J45901 Unspecified asthma with (acute) exacerbation: Secondary | ICD-10-CM

## 2012-08-24 DIAGNOSIS — R5383 Other fatigue: Secondary | ICD-10-CM

## 2012-08-24 DIAGNOSIS — E669 Obesity, unspecified: Secondary | ICD-10-CM

## 2012-08-24 DIAGNOSIS — E162 Hypoglycemia, unspecified: Secondary | ICD-10-CM

## 2012-08-24 LAB — POCT CBC
Granulocyte percent: 52 %G (ref 37–80)
HCT, POC: 39.7 % (ref 37.7–47.9)
Hemoglobin: 12.6 g/dL (ref 12.2–16.2)
MCH, POC: 29.1 pg (ref 27–31.2)
MCV: 91.8 fL (ref 80–97)
MID (cbc): 0.5 (ref 0–0.9)
Platelet Count, POC: 244 10*3/uL (ref 142–424)
RBC: 4.33 M/uL (ref 4.04–5.48)
WBC: 5.8 10*3/uL (ref 4.6–10.2)

## 2012-08-24 MED ORDER — FLUTICASONE-SALMETEROL 250-50 MCG/DOSE IN AEPB: 1.0000 | INHALATION_SPRAY | Freq: Two times a day (BID) | RESPIRATORY_TRACT | Status: DC

## 2012-08-24 NOTE — Progress Notes (Signed)
Date:  08/24/2012   Name:  Deborah Pena   DOB:  06/22/1966   MRN:  [id number] Gender: female Age: 46 y.o.  PCP:  Sheila Oats, MD    Chief Complaint: Dizziness, Fatigue, Insomnia and pain left side to back   History of Present Illness:  Deborah Pena is a 46 y.o. pleasant patient who presents with the following:  Seen on 9/2 with low blood pressure and symptoms suggestive of hypoglycemia; sweats, dizziness, mild confusion, sweats, tachycardia, nausea no vomiting.  Says that she checks her blood sugar and it is frequently in the 60-90 range.  No fever, chills, stool change, cough, coryza, shortness of breath but describes using an inhaler for asthma that is "out of control".  Using albuterol 3-4 times daily..    There is no problem list on file for this patient.   Past Medical History  Diagnosis Date  . Diverticulitis of colon     Past Surgical History  Procedure Date  . Abdominal hysterectomy     preformed at [facility] by Dr Ardelle Anton in April    History  Substance Use Topics  . Smoking status: Former Smoker    Types: Cigarettes  . Smokeless tobacco: Not on file  . Alcohol Use: No    No family history on file.  Allergies  Allergen Reactions  . Codeine Itching    Itches from inside out  . Morphine And Related Other (See Comments)    Becomes phsycotic    Medication list has been reviewed and updated.  Current Outpatient Prescriptions on File Prior to Visit  Medication Sig Dispense Refill  . albuterol (PROVENTIL HFA;VENTOLIN HFA) 108 (90 BASE) MCG/ACT inhaler Inhale 2 puffs into the lungs every 6 (six) hours as needed.      Marland Kitchen alprazolam (XANAX) 2 MG tablet Take 2 mg by mouth at bedtime as needed. For stress      . ibuprofen (ADVIL,MOTRIN) 200 MG tablet Take 200 mg by mouth every 6 (six) hours as needed. For pain      . Multiple Vitamins-Minerals (MULTIVITAMIN PO) Take 1 tablet by mouth daily.      Marland Kitchen oxyCODONE-acetaminophen (PERCOCET/ROXICET) 5-325 MG per  tablet Take 2 tablets by mouth every 4 (four) hours as needed for pain.  15 tablet  0  . zolpidem (AMBIEN) 10 MG tablet Take 10 mg by mouth at bedtime as needed.        Review of Systems:  As per HPI, otherwise negative.    Physical Examination: Filed Vitals:   08/24/12 1803  BP: 100/70  Pulse: 76  Temp: 98.3 F (36.8 C)  Resp: 16   Filed Vitals:   08/24/12 1803  Height: 5' 2.5" (1.588 m)  Weight: 235 lb 12.8 oz (106.958 kg)   Body mass index is 42.44 kg/(m^2). Ideal Body Weight: Weight in (lb) to have BMI = 25: 138.6   GEN: WDWN, NAD, Non-toxic, A & O x 3 HEENT: Atraumatic, Normocephalic. Neck supple. No masses, No LAD.  Oropharynx negative Ears and Nose: No external deformity. TM negative.  Nasal mucosa negative CV: RRR, No M/G/R. No JVD. No thrill. No extra heart sounds. PULM: CTA B, no wheezes, crackles, rhonchi. No retractions. No resp. distress. No accessory muscle use. ABD: S, NT, ND, +BS. No rebound. No HSM. EXTR: No c/c/e NEURO Normal gait.  PSYCH: Normally interactive. Conversant. Not depressed or anxious appearing.  Calm demeanor.    Assessment and Plan: Asthma Hypoglycemia by history Obesity Advair    Carmelina Dane,  MD

## 2012-08-24 NOTE — Addendum Note (Signed)
Addended by: Carmelina Dane on: 08/24/2012 07:10 PM   Modules accepted: Orders

## 2012-08-24 NOTE — Patient Instructions (Signed)

## 2012-08-25 LAB — COMPREHENSIVE METABOLIC PANEL
ALT: 62 U/L — ABNORMAL HIGH (ref 0–35)
CO2: 23 mEq/L (ref 19–32)
Calcium: 9.7 mg/dL (ref 8.4–10.5)
Chloride: 106 mEq/L (ref 96–112)
Creat: 0.89 mg/dL (ref 0.50–1.10)
Glucose, Bld: 102 mg/dL — ABNORMAL HIGH (ref 70–99)
Sodium: 140 mEq/L (ref 135–145)
Total Bilirubin: 0.3 mg/dL (ref 0.3–1.2)
Total Protein: 7 g/dL (ref 6.0–8.3)

## 2012-08-25 LAB — TSH: TSH: 1.655 u[IU]/mL (ref 0.350–4.500)

## 2012-08-28 ENCOUNTER — Encounter: Payer: Self-pay | Admitting: *Deleted

## 2012-09-01 ENCOUNTER — Encounter (HOSPITAL_COMMUNITY): Payer: Self-pay | Admitting: Psychiatry

## 2012-09-01 ENCOUNTER — Ambulatory Visit (INDEPENDENT_AMBULATORY_CARE_PROVIDER_SITE_OTHER): Payer: Managed Care, Other (non HMO) | Admitting: Psychiatry

## 2012-09-01 DIAGNOSIS — F251 Schizoaffective disorder, depressive type: Secondary | ICD-10-CM

## 2012-09-01 DIAGNOSIS — F259 Schizoaffective disorder, unspecified: Secondary | ICD-10-CM

## 2012-09-01 MED ORDER — CLONAZEPAM 1 MG PO TBDP: ORAL_TABLET | ORAL | Status: DC

## 2012-09-01 MED ORDER — BENZTROPINE MESYLATE 1 MG PO TABS: 1.0000 mg | ORAL_TABLET | Freq: Two times a day (BID) | ORAL | Status: DC

## 2012-09-01 NOTE — Progress Notes (Signed)
Psychiatric Assessment Adult  Patient Identification:  Deborah Pena Date of Evaluation:  09/01/2012 Chief Complaint: anxiety History of Chief Complaint:  No chief complaint on file. this patient is a 46 year old married mother who has a very complex extensive psychiatric history. She's been under the care of Dr. Jaci Standard and once a change to a new prior to reevaluate her. 3 years ago the patient was doing fairly well. Then with conflicts with her stepdaughter who she eventually removed from the home and the serious cul-de-sac occurred to her young daughter she became extremely distressed. She apparently became psychotic and [facility]. At that time she was extremely anxious and heard voices which nearly instantly went away when she was begun on Prolixin at that time. Unfortunately the Prolixin produced tardive dyskinesia. The patient is actually a poor historian. For the state hospitalization 2010 she was under the care of Dr. Tomasa Rand. Presently her significant stressors include that her daughter is at home being home schooled. Her daughter is extremely ill and very sensitive to infections hence she cannot get out of the house much at all. The daughter sleeps during the day and is up all night and the patient seems to want to stay with her. The patient's general complaints is that she's gained over 100 pounds in the last year or so. At this time she feels extremely anxious. It should be noted that 4 days ago she had 1 out of her Xanax that she was taking for the last 3 months. She was taking 2 mg twice a day. At this time she acknowledges persistent daily depression particularly in the last couple of months. Overall the patient sleeps during the day as long she takes her Ambien. Her appetite is increased her energy level is low and her concentration is afflicted. Patient acknowledges that she has little ability to enjoy anything she denies being worthless. She specifically  denies being suicidal. It should be noted that she's made 2 suicide attempts in her distant past at age 55 and age 60. During this time she did not seek treatment. The patient is inconsistent in terms of auditory and visual hallucinations prescriptions at this time is that she hears voices once or twice a week and had 1 vision about a week ago. In essence I do not believe that she is having active auditory or visual hallucinations at this time. During the interview he acknowledged that in fact she had heard voices throughout a lot of times in her life particularly in her teenage life peer I'm not clear how intensely actually worked and if she was on medicines during these years. Her past psychiatric history significant not only for her state hospitalization stay in 2010 but also one hospitalization 30 years ago. She says her diagnosis is schizophrenia but this is difficult to appreciate. The patient been married for 15 years and loves her husband but cannot communicate. The patient denies the use of alcohol or drugs at any time. The close evaluation she actually denies a distinct episode of major depression in her adult life. She denies any symptoms of mania. She denies symptoms consistent with generalized anxiety disorder other than the fact that she worries all the time about everything. But in reality she does not have all the features of generalized anxiety disorder. I do not believe she is panic disorder albeit she has episodes of intense panic. There is no evidence of OCD. Her isolated description of a visual hallucination was out of  Marchelle Folks the end of the bed spoke with her this occurred one time. Her major stressor has been the acute illness of her daughter even in the last year. Apparently she had 2 episodes when she was unarousable and the patient claims that with the help of 911 she was revived. The patient said she had a shaker to revive her. She believes this is her biggest stressor. The patient has  been on multiple antipsychotic medications. She's been on Latuda which made her anxious to she says she's been on Fanopt but cannot remember its effects to noted is that she's never really had a full trial of Geodon. She was one time on Abilify but this made her angry. She's been on Zyprexa which she attributes to her weight gain. Noted is that she's been on Xanax 4 mg a day for only 3 months. She's been on Risperdal 4 mg for the last one year. In this evaluation and to a discussion with the pharmacist it is apparent this patient is low most likely abusing or misusing her Xanax. According to the pharmacy she should have enough to last her for 3 more weeks. I discussed this with her husband over the phone and with the patient and at this time what we will do is give her Klonopin 1 mg one in the morning 2 at night 90 pills and no refills. Over the next 2 months I will have her off of Klonopin . If in fact she's been taking a higher dose of Xanax and concerned of possible withdrawal and I think this Klonopin dose should protect her from that. The patient therefore seems a bit shifty or at least a very poor historian.  HPI Review of Systems Physical Exam  Depressive Symptoms: depressed mood,  (Hypo) Manic Symptoms:   Elevated Mood:  No Irritable Mood:  Yes Grandiosity:  No Distractibility:  No Labiality of Mood:  No Delusions:  No Hallucinations:  No Impulsivity:  No Sexually Inappropriate Behavior:  No Financial Extravagance:  No Flight of Ideas:  No  Anxiety Symptoms: Excessive Worry:  Yes Panic Symptoms:  No Agoraphobia:  No Obsessive Compulsive: No  Symptoms: None, Specific Phobias:  No Social Anxiety:  No  Psychotic Symptoms:  Hallucinations: No None Delusions:  No Paranoia:  No   Ideas of Reference:  No  PTSD Symptoms: Ever had a traumatic exposure:  No Had a traumatic exposure in the last month:  No Re-experiencing: No None Hypervigilance:  No Hyperarousal: No  None Avoidance: No Foreshortened Future None  Traumatic Brain Injury: No   Past Psychiatric History: Diagnosis: Schizoaffective disorder  Hospitalizations: 2x  Last one 2010 [facility]  Outpatient Care:   Substance Abuse Care:   Self-Mutilation:   Suicidal Attempts: 2x  Thirty years ago  Violent Behaviors:    Past Medical History:   Past Medical History  Diagnosis Date  . Diverticulitis of colon    History of Loss of Consciousness:   Seizure History:   Cardiac History:   Allergies:   Allergies  Allergen Reactions  . Codeine Itching    Itches from inside out  . Morphine And Related Other (See Comments)    Becomes phsycotic   Current Medications:  Current Outpatient Prescriptions  Medication Sig Dispense Refill  . albuterol (PROVENTIL HFA;VENTOLIN HFA) 108 (90 BASE) MCG/ACT inhaler Inhale 2 puffs into the lungs every 6 (six) hours as needed.      Marland Kitchen alprazolam (XANAX) 2 MG tablet Take 2 mg by mouth at bedtime as  needed. For stress      . Fluticasone-Salmeterol (ADVAIR DISKUS) 250-50 MCG/DOSE AEPB Inhale 1 puff into the lungs 2 (two) times daily.  60 each  12  . ibuprofen (ADVIL,MOTRIN) 200 MG tablet Take 200 mg by mouth every 6 (six) hours as needed. For pain      . Multiple Vitamins-Minerals (MULTIVITAMIN PO) Take 1 tablet by mouth daily.      Marland Kitchen oxyCODONE-acetaminophen (PERCOCET/ROXICET) 5-325 MG per tablet Take 2 tablets by mouth every 4 (four) hours as needed for pain.  15 tablet  0  . zolpidem (AMBIEN) 10 MG tablet Take 10 mg by mouth at bedtime as needed.        Previous Psychotropic Medications:  Medication Dose   Xanax 2 mg twice a day, Ambien 10 mg, Risperdal 4 mg                       Substance Abuse History in the last 12 months:                                                                                                   Medical Consequences of Substance Abuse:    Legal Consequences of Substance Abuse:    Family Consequences of  Substance Abuse:    Blackouts:  No DT's:  No Withdrawal Symptoms:  No None  Social History: Current Place of Residence: [place] Family Members:  Marital Status:  Married Children: 1 Sons:   Daughters:  Relationships:  Education:  Corporate treasurer Problems/Performance:  Religious Beliefs/Practices:  History of Abuse:  Teacher, music History:   Legal History:  Hobbies/Interests:   Family History:  No family history on file.  Mental Status Examination/Evaluation: Objective:  Appearance: Casual  Eye Contact::  Good  Speech:  Clear and Coherent  Volume:  Normal  Mood: Anxiety  Affect:  Congruent  Thought Process:  Coherent  Orientation:  Full  Thought Content:  WDL  Suicidal Thoughts:  No  Homicidal Thoughts:  No  Judgement:  Fair  Insight:  Lacking  Psychomotor Activity:  Increased  Akathisia:  Yes  Handed:  Right  AIMS (if indicated):    Assets:  Desire for Improvement    Laboratory/X-Ray Psychological Evaluation(s)        Assessment:  Axis I: Schizoaffective Disorder  AXIS I Schizoaffective Disorder  AXIS II No diagnosis  AXIS III Past Medical History  Diagnosis Date  . Diverticulitis of colon      AXIS IV other psychosocial or environmental problems  AXIS V 41-50 serious symptoms   Treatment Plan/Recommendations:  Plan of Care: at this time the patient or presents a complex patient to she shares with me that Dr. Tomasa Rand referred her to see another psychiatrist to get more treatment that would be more thorough and attentive.I suspect she is a difficult problem patient.I think she potentially is drug seeking either because of its addiction her because she is so a mentally ill.she failed to clarify with me that in fact she was using extra Xanax. Noted also is a primary care doctor gave her  some Ativan just recently that she failed to share. I think this patient is unresponsible to take her own medicines and therefore I  insisted that her husband be in charge of it. I think they're some secrets that she's not sharing.Further Dr. Tomasa Rand recommended psychotherapy but never made any specific referrals. I do think the patient is probably experiencing some akathisia. I also think that she is probably going through somewhat of a withdrawal from her Xanax.it also became evident through discussion with the pharmacist that she is in fact taking more than prescribed and that she should have enough to last 2 or 3 more weeks.my plans are to place her on Klonopin  one in the morning and 2 at night. I suspect I will take her off of this agent in the next month or 2. The possibility of giving her Neurontin should be considered.While she says that she wants her Ambien in reality  according to the pharmacist she has plenty of Sonata prescribed for her. Therefore I will not be prescribing her Ambien. I will begin her on Cogentin 1 mg twice a day. I will also continue her Risperdal at 4 mg. Today the patient will be referred to a psychotherapist, Mrs. Orvan July.  Laboratory:    Psychotherapy:    Medications:    Routine PRN Medications:  No  Consultations:    Safety Concerns:     Other:      Lucas Mallow, MD 9/13/20139:37 AM

## 2012-09-08 ENCOUNTER — Other Ambulatory Visit (HOSPITAL_COMMUNITY): Payer: Self-pay | Admitting: Psychiatry

## 2012-09-13 ENCOUNTER — Telehealth (HOSPITAL_COMMUNITY): Payer: Self-pay | Admitting: *Deleted

## 2012-09-13 NOTE — Telephone Encounter (Signed)
Today I spoke with her husband. She did describe some blurred vision. Further she described some fleeting visual hallucinations. I think this possibly is due to having too much Cogentin. I asked her husband to reduce it just taking one Cogentin at night. This is a very inconsistent patient who is a very poor historian and reporter of her symptoms. She's not going through withdrawal and she is no clear psychotic disorder. I asked husband to call me back in a week if she was not improved.

## 2012-09-13 NOTE — Telephone Encounter (Signed)
Husband left ZO:XWRUE starting new medicine-Side effects are concerning:Blurred vision,Nightmares, Seeing things that aren't there,Hearing voices. Requesting call from provider.

## 2012-09-16 ENCOUNTER — Other Ambulatory Visit: Payer: Self-pay | Admitting: *Deleted

## 2012-09-16 MED ORDER — FLUTICASONE-SALMETEROL 250-50 MCG/DOSE IN AEPB: 1.0000 | INHALATION_SPRAY | Freq: Two times a day (BID) | RESPIRATORY_TRACT | Status: DC

## 2012-09-18 ENCOUNTER — Ambulatory Visit (HOSPITAL_COMMUNITY): Payer: Self-pay | Admitting: Licensed Clinical Social Worker

## 2012-09-18 ENCOUNTER — Encounter (HOSPITAL_COMMUNITY): Payer: Self-pay | Admitting: Licensed Clinical Social Worker

## 2012-09-18 NOTE — Progress Notes (Signed)
Patient ID: Deborah Pena, female   DOB: 03/13/1966, 46 y.o.   MRN: [id number] Patient no showed for appointment. She called 15 minutes into her appointment to cancel. Will reschedule.

## 2012-09-22 ENCOUNTER — Telehealth (HOSPITAL_COMMUNITY): Payer: Self-pay | Admitting: Psychiatry

## 2012-09-22 MED ORDER — CLONAZEPAM 1 MG PO TBDP: ORAL_TABLET | ORAL | Status: DC

## 2012-09-22 MED ORDER — RISPERIDONE 2 MG PO TABS: ORAL_TABLET | ORAL | Status: DC

## 2012-09-28 ENCOUNTER — Ambulatory Visit: Payer: Managed Care, Other (non HMO) | Admitting: Family Medicine

## 2012-09-28 ENCOUNTER — Telehealth (HOSPITAL_COMMUNITY): Payer: Self-pay

## 2012-10-03 ENCOUNTER — Other Ambulatory Visit (HOSPITAL_COMMUNITY): Payer: Self-pay

## 2012-10-04 ENCOUNTER — Telehealth (HOSPITAL_COMMUNITY): Payer: Self-pay | Admitting: Psychiatry

## 2012-10-04 MED ORDER — GABAPENTIN 300 MG PO CAPS: ORAL_CAPSULE | ORAL | Status: DC

## 2012-10-06 ENCOUNTER — Ambulatory Visit (HOSPITAL_COMMUNITY): Payer: Self-pay | Admitting: Psychiatry

## 2012-10-10 ENCOUNTER — Telehealth (HOSPITAL_COMMUNITY): Payer: Self-pay | Admitting: *Deleted

## 2012-10-10 NOTE — Telephone Encounter (Signed)
Patient left ZO:XWRUEAV not better.Pretty bad.Feels like anxiety is "out of control". Appt is 11/6 and cannot wait until then to get better

## 2012-10-10 NOTE — Telephone Encounter (Signed)
Dr.Plovsky left KZ:SWFUXN called pt  today responding to AM call, no answer at pt's #.States he left pt message:If she continues to feels worse, she should come to [facility] for assessment for IOP. If not, her will see her 11/6, and he will not continue to call her back.

## 2012-10-19 ENCOUNTER — Ambulatory Visit (HOSPITAL_COMMUNITY): Payer: Self-pay | Admitting: Licensed Clinical Social Worker

## 2012-10-20 ENCOUNTER — Ambulatory Visit (HOSPITAL_COMMUNITY): Payer: Self-pay | Admitting: Psychiatry

## 2012-10-25 ENCOUNTER — Ambulatory Visit (HOSPITAL_COMMUNITY): Payer: Managed Care, Other (non HMO) | Admitting: Psychiatry

## 2012-10-25 MED ORDER — GABAPENTIN 300 MG PO CAPS: ORAL_CAPSULE | ORAL | Status: DC

## 2012-10-25 MED ORDER — RISPERIDONE 1 MG PO TABS: ORAL_TABLET | ORAL | Status: DC

## 2012-10-25 NOTE — Progress Notes (Signed)
[facility] MD Progress Note  10/25/2012 4:31 PM Deborah Pena  MRN:  [id number]  Diagnosis:  Schizo affective disorder Today this patient and I had an extensive session in regards to her diagnosis. Her major psychosocial stressor is the fact that her daughter is chronically ill with an autoimmune disease and has a severe sleep disorder where she sleeps all day and is up at night. The patient is forced to be with her all night where she gets homeschooled. The patient's main complaints is that of extreme anxiety. She also describes auditory hallucinations but these are actually inconsistent and vague. She essentially here is a word or phrase once a week or every other week. I do not think this is credible auditory hallucinations. She has claim that she heard voices more so and more intensely but not to a clinical a meaningful degree at this time. The patient denies persistent depression. She denies ever having experienced of mania of any nature. Initially the patient was overeating but actually now is eating more normally. Her energy level seems reasonable to me. I think she's concentrating fairly good. Once again she cannot define clear episodes of major depression or mania. It should be noted that she's been house was only one time in 2009 when she was anxious hearing voices and was at the [facility] at that time. She says they diagnosed her with posttraumatic stress disorder from traumas occurred in her childhood. At that time she was treated with Prolixin for auditory hallucinations which she said worked immediately. I think her psychiatric symptoms are inconsistent and I think there is a great deal of psychological overlay dictating her responsiveness to psychotropic medications. I think this time she's been overtreated with medications or not clear indications and has been detrimental to her health. She has gained 105 pounds over the one year that she been on Risperdal. I believe she's experience withdrawal  from Xanax. Now her husband apparently handles all her medications which I think is great. Patient was supposed to see a therapist but canceled because her daughter got sick but she has another appointment. Today the patient agreed with the plan to taper and discontinue her Risperdal. Psychologically she is scared to be without an antipsychotic medications because she is fearful that her voices will come back. She's never been on Geodon before and I described this is an agent we should left likely gained weight. I will give her a relatively small dose of 60 mg to take with a meal. We'll titrate off her Risperdal going from her 6 mg dosage now coming off of it over 7 week period. The patient also increase her Neurontin that I had started since I saw her from a 100 mg pill twice a day to 300 mg twice a day. I will also call her in some trazodone 100 mg for sleep. ADL's:  Intact  Sleep: Poor  Appetite:  Good  Suicidal Ideation:  no Homicidal Ideation:  no  AEB (as evidenced by):  Mental Status Examination/Evaluation: Objective:  Appearance: Casual  Eye Contact::  Good  Speech:  Clear and Coherent  Volume:  Normal  Mood:  Depressed  Affect:  Congruent  Thought Process:  Coherent  Orientation:  Full  Thought Content:  WDL  Suicidal Thoughts:  no  Homicidal Thoughts:  No  Memory:    Judgement:  Good  Insight:  Good  Psychomotor Activity:  Normal  Concentration:  Good  Recall:  Good  Akathisia:  No  Handed:  Right  AIMS (if indicated):     Assets:  Desire for Improvement  Sleep:      Vital Signs:There were no vitals taken for this visit. Current Medications: Current Outpatient Prescriptions  Medication Sig Dispense Refill  . albuterol (PROVENTIL HFA;VENTOLIN HFA) 108 (90 BASE) MCG/ACT inhaler Inhale 2 puffs into the lungs every 6 (six) hours as needed.      . benztropine (COGENTIN) 1 MG tablet Take 1 tablet (1 mg total) by mouth 2 (two) times daily.  60 tablet  5  . clonazePAM  (KLONOPIN) 1 MG disintegrating tablet 1 qam  2  qhs  90 tablet  2  . Fluticasone-Salmeterol (ADVAIR DISKUS) 250-50 MCG/DOSE AEPB Inhale 1 puff into the lungs 2 (two) times daily.  180 each  3  . gabapentin (NEURONTIN) 300 MG capsule One BID  60 capsule  3  . ibuprofen (ADVIL,MOTRIN) 200 MG tablet Take 200 mg by mouth every 6 (six) hours as needed. For pain      . Multiple Vitamins-Minerals (MULTIVITAMIN PO) Take 1 tablet by mouth daily.      . risperiDONE (RISPERDAL) 2 MG tablet 3 qhs  90 tablet  5  . zolpidem (AMBIEN) 10 MG tablet Take 10 mg by mouth at bedtime as needed.        Lab Results: No results found for this or any previous visit (from the past 48 hour(s)).  Physical Findings: AIMS:  , ,  ,  ,    CIWA:    COWS:     Treatment Plan Summary:   Plan:at this time the patient will begin tapering off her Risperdal taking 2 mg 2 a day for one week then she'll begin on Risperdal 1 mg 3 a day for one week then 2 a day for one week then one a day for one week and a half a day for 2 weeks and then discontinue it. She'll begin on Geodon 60 mg increase her Neurontin to 300 mg 1 twice a day and begin on trazodone 100 mg one or 2 at night. She will continue taking the Klonopin 1 mg pill 3 times a day and ultimately will attempt to come off the Klonopin. We'll encourage her to go to her therapy session and to return to see me in 7 weeks. Deborah Pena IRVING 10/25/2012, 4:31 PM

## 2012-10-26 ENCOUNTER — Telehealth (HOSPITAL_COMMUNITY): Payer: Self-pay | Admitting: *Deleted

## 2012-10-26 NOTE — Telephone Encounter (Signed)
See phone note

## 2012-10-31 ENCOUNTER — Ambulatory Visit (HOSPITAL_COMMUNITY): Payer: Self-pay | Admitting: Licensed Clinical Social Worker

## 2012-10-31 ENCOUNTER — Encounter (HOSPITAL_COMMUNITY): Payer: Self-pay | Admitting: Licensed Clinical Social Worker

## 2012-10-31 NOTE — Progress Notes (Signed)
Patient ID: Deborah Pena, female   DOB: [birth year], 46 y.o.   MRN: [id number] Patient was a no show/no call for the second time for initial assessment. Will not schedule again.

## 2012-11-06 ENCOUNTER — Telehealth (HOSPITAL_COMMUNITY): Payer: Self-pay

## 2012-11-09 ENCOUNTER — Telehealth (HOSPITAL_COMMUNITY): Payer: Self-pay | Admitting: *Deleted

## 2012-11-09 NOTE — Telephone Encounter (Signed)
:  Left a message 3 days ago, has not heard from any one. Appt 12/18.Needs something different than Klonopin for anxiety before then.Also states Neurontin causes her blurry vision and that it is difficult to drive.

## 2012-11-10 MED ORDER — CLONAZEPAM 1 MG PO TBDP: ORAL_TABLET | ORAL | Status: DC

## 2012-11-10 NOTE — Addendum Note (Signed)
Addended by: Archer Asa on: 11/10/2012 10:33 AM   Modules accepted: Orders

## 2012-11-10 NOTE — Telephone Encounter (Signed)
Left message on this patient's machine. Generally I interpret her as being mainly more anxious. I suspect this possibly is because she is taking less and less Risperdal which I suspect is having a calming/sedating effect. For right now I recommended to her to increase her Klonopin to a 1 mg pill and take 2 in the morning and 2 at night. At an increased from 3 pills to 4 pills a day. Once again ultimately my plan is to get her off the Klonopin. I would have increased her Neurontin but she says she's having some visual problems from it which is very very unusual. I think this patient is over somatic. I asked her to keep her next appointment and at that time the next thing I possibly would do would be increasing her Neurontin or adding some Vistaril for her medications.

## 2012-12-06 ENCOUNTER — Ambulatory Visit (INDEPENDENT_AMBULATORY_CARE_PROVIDER_SITE_OTHER): Payer: Managed Care, Other (non HMO) | Admitting: Psychiatry

## 2012-12-06 DIAGNOSIS — F4322 Adjustment disorder with anxiety: Secondary | ICD-10-CM

## 2012-12-06 MED ORDER — CLONAZEPAM 1 MG PO TBDP: ORAL_TABLET | ORAL | Status: DC

## 2012-12-06 NOTE — Progress Notes (Signed)
[facility] MD Progress Note  12/06/2012 4:02 PM Deborah Pena  MRN:  [id number] Subjective:  better Diagnosis:  Axis I: Adjustment Disorder with Anxiety At this time this patient actually denies being depressed. She does describe it an increased level of anxiety. There are clear issues and stresses that she's having now. Her chronically ill daughter does have pneumonia according to her and presently is on antibiotic. The patient is scheduled for foot surgery which would be a significant event. She has chronic pain and problems with her ankle and she'll likely be in a cast. The patient has done a fairly good job cutting down her Risperdal significantly down to 1 mg. She said she couldn't completely stop it because without it she hyperverbal frightening thoughts. She says without it she feels so anxious and fearful that bad things are going to happen. She has a type of expectations of town paranoid or excessive in Editor, commissioning. I'm not clear the Risperdal really has an affect on this but nonetheless she's done a good job cutting it down and as a result has lost 15 pounds. The patient claims that she is sleeping well as long she takes the trazodone and she's eating well. Her concentration is good and she is a reasonable amount of energy. The patient has not had any auditory or visual hallucinations. She is taking the Geodon, Klonopin, trazodone and 300 mg of Neurontin twice a day. When she has her foot surgery her husband will be to share in her care and her daughter's care. It should be noted the patient does enjoy some things she has 3 cats watches TV and reads. She however says that she spends most of her time caring for her daughter. Unfortunately this patient is yet to engage in psychotherapy here. I am resistant to make all that many more changes until she is engaged in therapy. She claims her daughter keeps getting sick and she needs to keep canceling the appointment. Given her upcoming surgery, her daughter's acute  illness and the fact that the holidays I will go ahead and continue this care and continue the Risperdal at its low dose. Ultimately my and goal is to eliminate the Risperdal and also cut back on the Klonopin. The patient initially requested something different than Klonopin because she says it's just not working on her anxiety. I shared with the patient that instead of changing the Klonopin up or down we'll simply add some more Neurontin. I shared with her that Neurontin is helpful also for pain but is likely to happen to her when she had surgery. ADL's:  Intact  Sleep: Good  Appetite:  Good  Suicidal Ideation:  no Homicidal Ideation:  no AEB (as evidenced by):  Psychiatric Specialty Exam: ROS  There were no vitals taken for this visit.There is no height or weight on file to calculate BMI.  General Appearance: Casual  Eye Contact::  Good  Speech:  Normal Rate  Volume:  Normal  Mood:  Anxious  Affect:  Appropriate  Thought Process:  Circumstantial and Coherent  Orientation:  Full (Time, Place, and Person)  Thought Content:  WDL  Suicidal Thoughts:  No  Homicidal Thoughts:  No  Memory:  nl  Judgement:  Good  Insight:  Good  Psychomotor Activity:  Normal  Concentration:  Good  Recall:  Good  Akathisia:  No  Handed:  Right  AIMS (if indicated):     Assets:  Desire for Improvement  Sleep:      Current Medications: Current  Outpatient Prescriptions  Medication Sig Dispense Refill  . albuterol (PROVENTIL HFA;VENTOLIN HFA) 108 (90 BASE) MCG/ACT inhaler Inhale 2 puffs into the lungs every 6 (six) hours as needed.      . benztropine (COGENTIN) 1 MG tablet Take 1 tablet (1 mg total) by mouth 2 (two) times daily.  60 tablet  5  . clonazePAM (KLONOPIN) 1 MG disintegrating tablet 2 BID  120 tablet  2  . Fluticasone-Salmeterol (ADVAIR DISKUS) 250-50 MCG/DOSE AEPB Inhale 1 puff into the lungs 2 (two) times daily.  180 each  3  . gabapentin (NEURONTIN) 300 MG capsule One BID  120 capsule   5  . ibuprofen (ADVIL,MOTRIN) 200 MG tablet Take 200 mg by mouth every 6 (six) hours as needed. For pain      . Multiple Vitamins-Minerals (MULTIVITAMIN PO) Take 1 tablet by mouth daily.      . risperiDONE (RISPERDAL) 1 MG tablet Taken as directed tapering over the next 7 weeks.  50 tablet  1  . risperiDONE (RISPERDAL) 2 MG tablet 3 qhs  90 tablet  5  . zolpidem (AMBIEN) 10 MG tablet Take 10 mg by mouth at bedtime as needed.        Lab Results: No results found for this or any previous visit (from the past 48 hour(s)).  Physical Findings: AIMS:  , ,  ,  ,    CIWA:    COWS:     Treatment Plan Summary:   Plan:at this time the patient will continue taking Risperdal 1 mg a day. She will continue Klonopin 1 mg 3 times a day and Geodon 60 mg. Should continue taking trazodone as ordered. Today will go ahead and increase her Neurontin from 300 mg twice a day to a dose of 300 mg 3 in the morning and 3 at night. This probably my maximum dose of Neurontin. Any other changes in any of her medication will occur only after she is invested in psychotherapy. Her next visit in 3 months I will at that time begin reducing her Risperdal. The other option is to continue the Risperdal and discontinue her Geodon. At this time I find little evidence of a psychotic process in this individual.  Medical Decision Making Problem Points:  Established problem, worsening (2) Data Points:  Review of new medications or change in dosage (2)  I certify that inpatient services furnished can reasonably be expected to improve the patient's condition.   Nicholaus Steinke IRVING 12/06/2012, 4:02 PM

## 2013-01-02 ENCOUNTER — Telehealth (HOSPITAL_COMMUNITY): Payer: Self-pay | Admitting: *Deleted

## 2013-01-02 DIAGNOSIS — F4322 Adjustment disorder with anxiety: Secondary | ICD-10-CM

## 2013-01-02 NOTE — Telephone Encounter (Signed)
Contacted [facility], [address] @ [id number]. Pharmacy states they have been faxing request for 90 day supply #180 tablets) to [phone] Pharmacist states pt has refills for 30 days remaining on Trazodone originally called in on 10/25/12 by Dr.Plovsky  Per pharmacist, insurance will only pay for 90 day supply of medicine, not 30 day supply, and pt is requesting an RX for a 90 day supply.

## 2013-01-03 ENCOUNTER — Other Ambulatory Visit (HOSPITAL_COMMUNITY): Payer: Self-pay | Admitting: *Deleted

## 2013-01-03 MED ORDER — TRAZODONE HCL 100 MG PO TABS: 100.0000 mg | ORAL_TABLET | Freq: Every day | ORAL | Status: DC

## 2013-01-03 NOTE — Telephone Encounter (Signed)
Per Dr.Plovsky, Trazodone 100 mg, take 1-2 tablets at bedtime #60 with 2 refills was called to pharmacy 10/25/12. Dr.Plovsky authorized 90 day RX for this patient. Pharmacy contacted and given verbal order for 90 day supply of Trazodone.

## 2013-01-03 NOTE — Addendum Note (Signed)
Addended by: Tonny Bollman on: 01/03/2013 04:24 PM   Modules accepted: Orders

## 2013-01-23 ENCOUNTER — Telehealth (HOSPITAL_COMMUNITY): Payer: Self-pay | Admitting: *Deleted

## 2013-01-23 NOTE — Telephone Encounter (Signed)
Contacted Dr.Plovsky at [phone]. Left following message: Informed him of calls from patient and follow up call with pharmacy regarding past refill dates and next date available. Informed him that no early refill could be given without his approvaL Gave him pharmacy contact information. Requested he contact office in the AM to advise on early refill.

## 2013-01-24 NOTE — Telephone Encounter (Signed)
I reviewed this patient's chart and it is evident that she might be out of Klonopin a day or 2 earlier. I called her to attempt to speak to her about this and I got her answering machine. I left a message that she should try to distribute the Klonopin by taking a left for the next few days to that she has enough to cover her even at a lower dose until she seen or until her next prescription is due which is apparently within 4 or 5 days. She shared with [place] that she does in fact have 3 or 4 days left. I've chosen not to: Any extra Klonopin. Again she was unavailable by phone .

## 2013-01-25 ENCOUNTER — Telehealth (HOSPITAL_COMMUNITY): Payer: Self-pay | Admitting: *Deleted

## 2013-01-25 DIAGNOSIS — F4322 Adjustment disorder with anxiety: Secondary | ICD-10-CM

## 2013-01-25 MED ORDER — CLONAZEPAM 1 MG PO TABS: 1.0000 mg | ORAL_TABLET | Freq: Two times a day (BID) | ORAL | Status: DC | PRN

## 2013-01-25 NOTE — Telephone Encounter (Signed)
See phone/routing comments No [facility] by [facility] for Clonazepam per pharmacist Cloyde Reams for 30 day supply given "Do not fill until 01/30/13"

## 2013-02-15 ENCOUNTER — Telehealth (HOSPITAL_COMMUNITY): Payer: Self-pay | Admitting: *Deleted

## 2013-02-15 DIAGNOSIS — F4322 Adjustment disorder with anxiety: Secondary | ICD-10-CM

## 2013-02-15 NOTE — Telephone Encounter (Signed)
Patient left VM: Patient called requesting refill of Trazodone and wants to try Lunesta for sleep, has worked for her in the past. Advised pt that Trazodone last given 01/03/13 was a 90 day supply.Patient states she is taking only 2 per night and only has a weeks worth left. Pt also says she is takin Neurontin 3 times day, prescribed BID. States foot doctor told her to increase to TID. Also wants MD to know that she has become immune toTrazodone and Clonazepam and needs to change both medicines. Informed pt that message would be given to MD

## 2013-02-16 ENCOUNTER — Telehealth (HOSPITAL_COMMUNITY): Payer: Self-pay

## 2013-02-16 MED ORDER — ESZOPICLONE 3 MG PO TABS: 3.0000 mg | ORAL_TABLET | Freq: Every evening | ORAL | Status: DC | PRN

## 2013-02-16 NOTE — Telephone Encounter (Signed)
At this time it is reasonable to try Lunesta 3 mg at night. I would give her 30 pills with 3 refills.I called the patient but there was no answer I left a message that I would not wish to change any other medications such as her Neurontin or her closet him until she seen in the office. I am however willing and I told her this to go ahead and start some Lunesta at night.

## 2013-02-16 NOTE — Addendum Note (Signed)
Addended by: Archer Asa on: 02/16/2013 12:00 PM   Modules accepted: Orders

## 2013-02-23 ENCOUNTER — Ambulatory Visit (HOSPITAL_COMMUNITY): Payer: Self-pay | Admitting: Psychiatry

## 2013-02-27 ENCOUNTER — Telehealth (HOSPITAL_COMMUNITY): Payer: Self-pay

## 2013-02-28 ENCOUNTER — Other Ambulatory Visit (HOSPITAL_COMMUNITY): Payer: Self-pay | Admitting: *Deleted

## 2013-02-28 DIAGNOSIS — F4322 Adjustment disorder with anxiety: Secondary | ICD-10-CM

## 2013-02-28 MED ORDER — ZIPRASIDONE HCL 60 MG PO CAPS: 60.0000 mg | ORAL_CAPSULE | Freq: Every day | ORAL | Status: DC

## 2013-02-28 NOTE — Telephone Encounter (Signed)
Per Dr.Plovsky, contact pt for Trazodone remaining from 01/03/13 refill (verified with pharmacy) May give Trazodone 100 mg, take 1-2 at bedtime quantity to last until next appt  3/26, if pt out,when effectiveness will be evaluated. Dr.Plovsky requested Pharmacy be contacted to verify start date of Geodon 60 mg one daily. Pt insurance now requires 90 day supply, approved by Dr. Donell Beers with no further refills.

## 2013-02-28 NOTE — Telephone Encounter (Signed)
Today I called this patient and got her answering machine. I really chart it is evident to me that she likely is more anxious probably due to the weather. At this time my goal is to get this patient and her into therapy for the time being therefore I will be very resistant to changing any of her medicines. The Klonopin was she'll remain where it is. There is some questions about switching back to trazodone which would be fine with me. Therefore she called back and she wants to switch though a metastatic to trazodone to the previous dose that would be okay. The possibility of increasing her Neurontin should also be considered but I will be a big increase. For now we'll keep the Risperdal as is. Patient was asked to call me back and again try to get me and I'm here this afternoon on this date and I be glad to try to get back to her.

## 2013-03-01 ENCOUNTER — Telehealth (HOSPITAL_COMMUNITY): Payer: Self-pay | Admitting: *Deleted

## 2013-03-01 NOTE — Telephone Encounter (Signed)
Per Dr.Plovsky, all patient's medications that required refill were called in 02/27/13 as requested.he states he left pt a VM. Contacted pharmacy after pt requested Trazodone refill. Pharmacy stated RX for #180 given 01/03/13. Contact pt to inquire as to how she is taking Trazodone.Pt states has taken entire prescription of Trazodone.Questioned pt as to dosage-she stated MD told her she could take 3 a night if needed.Advised pt RX for 1-2 a night and RX was for 90 days.Advised pt would inquire if enough med could be given until app Contacted pharmacy.Per pharmacist Justin,original RX was to last until 04/03/13 and cannot be refilled without prior authorization from MD and insurance company.  Left message for pt with information from pharmacy.

## 2013-03-06 NOTE — Telephone Encounter (Signed)
To clarify  Yes give her Trazadone 100mg    3  qhs  With 5 refills    Thanks

## 2013-03-07 ENCOUNTER — Ambulatory Visit (HOSPITAL_COMMUNITY): Payer: Self-pay | Admitting: Psychiatry

## 2013-03-07 ENCOUNTER — Ambulatory Visit (INDEPENDENT_AMBULATORY_CARE_PROVIDER_SITE_OTHER): Payer: Managed Care, Other (non HMO) | Admitting: Psychiatry

## 2013-03-07 DIAGNOSIS — F4323 Adjustment disorder with mixed anxiety and depressed mood: Secondary | ICD-10-CM

## 2013-03-07 DIAGNOSIS — F4322 Adjustment disorder with anxiety: Secondary | ICD-10-CM

## 2013-03-07 MED ORDER — CLORAZEPATE DIPOTASSIUM 15 MG PO TABS: 15.0000 mg | ORAL_TABLET | Freq: Two times a day (BID) | ORAL | Status: DC

## 2013-03-07 MED ORDER — ZIPRASIDONE HCL 20 MG PO CAPS: 20.0000 mg | ORAL_CAPSULE | Freq: Every day | ORAL | Status: DC

## 2013-03-07 MED ORDER — ZIPRASIDONE HCL 60 MG PO CAPS: 20.0000 mg | ORAL_CAPSULE | Freq: Every day | ORAL | Status: DC

## 2013-03-07 MED ORDER — RISPERIDONE 1 MG PO TABS: ORAL_TABLET | ORAL | Status: DC

## 2013-03-07 MED ORDER — GABAPENTIN 300 MG PO CAPS: ORAL_CAPSULE | ORAL | Status: DC

## 2013-03-07 MED ORDER — CLONAZEPAM 1 MG PO TABS: 1.0000 mg | ORAL_TABLET | Freq: Two times a day (BID) | ORAL | Status: DC | PRN

## 2013-03-07 MED ORDER — TRAZODONE HCL 100 MG PO TABS: 100.0000 mg | ORAL_TABLET | Freq: Every day | ORAL | Status: DC

## 2013-03-07 MED ORDER — CLONAZEPAM 1 MG PO TABS: ORAL_TABLET | ORAL | Status: DC
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
## 2013-03-14 ENCOUNTER — Ambulatory Visit (HOSPITAL_COMMUNITY): Payer: Self-pay | Admitting: Psychiatry

## 2013-04-19 ENCOUNTER — Telehealth (HOSPITAL_COMMUNITY): Payer: Self-pay

## 2013-04-23 NOTE — Telephone Encounter (Signed)
I understand, but I would not accept her seeing another MD in our setting as she seeks MEDS too much. I doubt she has formed a relationship with a Therapist in our setting but if she has I would be willing to Reconsider my stance.

## 2013-04-25 ENCOUNTER — Ambulatory Visit (HOSPITAL_COMMUNITY): Payer: Self-pay | Admitting: Psychiatry

## 2013-04-28 ENCOUNTER — Encounter (HOSPITAL_COMMUNITY): Payer: Self-pay | Admitting: *Deleted

## 2013-04-28 ENCOUNTER — Emergency Department (HOSPITAL_COMMUNITY): Payer: Managed Care, Other (non HMO)

## 2013-04-28 DIAGNOSIS — Y9389 Activity, other specified: Secondary | ICD-10-CM | POA: Insufficient documentation

## 2013-04-28 DIAGNOSIS — Z87891 Personal history of nicotine dependence: Secondary | ICD-10-CM | POA: Insufficient documentation

## 2013-04-28 DIAGNOSIS — S93409A Sprain of unspecified ligament of unspecified ankle, initial encounter: Secondary | ICD-10-CM | POA: Insufficient documentation

## 2013-04-28 DIAGNOSIS — X500XXA Overexertion from strenuous movement or load, initial encounter: Secondary | ICD-10-CM | POA: Insufficient documentation

## 2013-04-28 DIAGNOSIS — Y929 Unspecified place or not applicable: Secondary | ICD-10-CM | POA: Insufficient documentation

## 2013-04-28 DIAGNOSIS — Z8719 Personal history of other diseases of the digestive system: Secondary | ICD-10-CM | POA: Insufficient documentation

## 2013-04-28 DIAGNOSIS — Z79899 Other long term (current) drug therapy: Secondary | ICD-10-CM | POA: Insufficient documentation

## 2013-04-28 NOTE — ED Notes (Signed)
Rt. Foot hyper extened back when trying to get up from the couch.  Marthe Patch it crack in two places. Previous foot surgery. Numbness and tingling. Pain is radiating up rt. Leg. Mild to moderate swelling around ankle.

## 2013-04-29 ENCOUNTER — Emergency Department (HOSPITAL_COMMUNITY)
Admission: EM | Admit: 2013-04-29 | Discharge: 2013-04-29 | Disposition: A | Discharge status: Home / Self Care | Payer: Managed Care, Other (non HMO) | Attending: Emergency Medicine | Admitting: Emergency Medicine

## 2013-04-29 ENCOUNTER — Emergency Department (HOSPITAL_COMMUNITY): Payer: Managed Care, Other (non HMO)

## 2013-04-29 DIAGNOSIS — S93401A Sprain of unspecified ligament of right ankle, initial encounter: Secondary | ICD-10-CM

## 2013-04-29 MED ORDER — HYDROCODONE-ACETAMINOPHEN 5-325 MG PO TABS: 1.0000 | ORAL_TABLET | Freq: Four times a day (QID) | ORAL | Status: DC | PRN

## 2013-04-29 MED ORDER — NAPROXEN 500 MG PO TABS: 500.0000 mg | ORAL_TABLET | Freq: Two times a day (BID) | ORAL | Status: DC

## 2013-04-29 MED ORDER — HYDROCODONE-ACETAMINOPHEN 5-325 MG PO TABS
1.0000 | ORAL_TABLET | Freq: Once | ORAL | Status: AC
  Administered 2013-04-29: 1 via ORAL
  Filled 2013-04-29: qty 1

## 2013-04-29 NOTE — ED Provider Notes (Signed)
History     CSN: [id number]  Arrival date & time 04/28/13  2217   First MD Initiated Contact with Patient 04/29/13 0027      Chief Complaint  Patient presents with  . Ankle Pain  . Foot Pain  . Fall    (Consider location/radiation/quality/duration/timing/severity/associated sxs/prior treatment) Patient is a 47 y.o. female presenting with ankle pain, lower extremity pain, and fall. The history is provided by the patient and the spouse.  Ankle Pain Associated symptoms: no fever and no neck pain   Foot Pain Pertinent negatives include no chest pain, no abdominal pain and no shortness of breath.  Fall Pertinent negatives include no fever and no abdominal pain.   patient got up from the couch and overturned her right foot and ankle heard a crack in 2 places. Patient had previous foot surgery to the right foot on the first metatarsal in December. Patient describes numbness and tingling to the foot the pain is radiating from the foot to the ankle and up into the leg. Patient states that most of the pain is ankle to the leg. There is swelling to the ankle. Patient's pain is 8/10 made worse by movement of the ankle and palpation. Pain is described as sharp. Past Medical History  Diagnosis Date  . Diverticulitis of colon     Past Surgical History  Procedure Laterality Date  . Abdominal hysterectomy      preformed at [facility] by Dr Ardelle Anton in April  . Foot surgery      No family history on file.  History  Substance Use Topics  . Smoking status: Former Smoker    Types: Cigarettes  . Smokeless tobacco: Not on file  . Alcohol Use: No    OB History   Grav Para Term Preterm Abortions TAB SAB Ect Mult Living                  Review of Systems  Constitutional: Negative for fever.  HENT: Negative for neck pain.   Eyes: Negative for visual disturbance.  Respiratory: Negative for shortness of breath.   Cardiovascular: Negative for chest pain.  Gastrointestinal: Negative for  abdominal pain.  Genitourinary: Negative for dysuria.  Musculoskeletal: Positive for joint swelling.  Skin: Negative for rash.  Hematological: Does not bruise/bleed easily.  Psychiatric/Behavioral: Negative for confusion.    Allergies  Codeine and Morphine and related  Home Medications   Current Outpatient Rx  Name  Route  Sig  Dispense  Refill  . acetaminophen (TYLENOL) 500 MG tablet   Oral   Take 1,000 mg by mouth every 6 (six) hours as needed for pain.         Marland Kitchen albuterol (PROVENTIL HFA;VENTOLIN HFA) 108 (90 BASE) MCG/ACT inhaler   Inhalation   Inhale 2 puffs into the lungs every 6 (six) hours as needed.         . gabapentin (NEURONTIN) 300 MG capsule   Oral   Take 300 mg by mouth 2 (two) times daily as needed (for pain). 3 BID         . Multiple Vitamins-Minerals (MULTIVITAMIN PO)   Oral   Take 1 tablet by mouth daily.         . risperiDONE (RISPERDAL) 1 MG tablet   Oral   Take 1 mg by mouth daily.         . traZODone (DESYREL) 100 MG tablet   Oral   Take 200 mg by mouth at bedtime.         Marland Kitchen  ziprasidone (GEODON) 20 MG capsule   Oral   Take 1 capsule (20 mg total) by mouth daily.   90 capsule   1     Not a new RX.Original RX 10/25/12 with 5 refills.Ch ...   . HYDROcodone-acetaminophen (NORCO/VICODIN) 5-325 MG per tablet   Oral   Take 1-2 tablets by mouth every 6 (six) hours as needed for pain.   20 tablet   0   . naproxen (NAPROSYN) 500 MG tablet   Oral   Take 1 tablet (500 mg total) by mouth 2 (two) times daily.   14 tablet   0     BP 132/83  Pulse 91  Temp(Src) 98.3 F (36.8 C) (Oral)  Resp 20  Ht 5\' 3"  (1.6 m)  Wt 210 lb (95.255 kg)  BMI 37.21 kg/m2  SpO2 97%  Physical Exam  Nursing note and vitals reviewed. Constitutional: She is oriented to person, place, and time. She appears well-developed and well-nourished. No distress.  HENT:  Head: Normocephalic and atraumatic.  Eyes: Conjunctivae and EOM are normal. Pupils are  equal, round, and reactive to light.  Neck: Normal range of motion.  Cardiovascular: Normal rate and regular rhythm.   No murmur heard. Pulmonary/Chest: Effort normal and breath sounds normal.  Abdominal: Soft. Bowel sounds are normal. There is no tenderness.  Musculoskeletal: Normal range of motion. She exhibits edema and tenderness.  Normal except for right foot ankle area patient with trouble tenderness to the lateral aspect of the ankle also some tenderness on the distal foot and some tenderness in the proximal tib-fib area. Cap refill is 1 second sensation is intact distally. There is some swelling laterally to the ankle.  Neurological: She is alert and oriented to person, place, and time. No cranial nerve deficit. She exhibits normal muscle tone. Coordination normal.  Skin: Skin is warm. No rash noted.    ED Course  Procedures (including critical care time)  Labs Reviewed - No data to display Dg Tibia/fibula Right  04/29/2013  *RADIOLOGY REPORT*  Clinical Data: Right lower leg pain.  No known injury.  RIGHT TIBIA AND FIBULA - 2 VIEW  Comparison: None.  Findings: No acute bony or joint abnormality is identified.  The patient is status post hallux valgus repair.  Soft tissue structures are unremarkable.  IMPRESSION: Negative exam.   Original Report Authenticated By: Holley Dexter, M.D.    Dg Ankle Complete Right  04/29/2013  *RADIOLOGY REPORT*  Clinical Data: Right ankle pain without known injury.  RIGHT ANKLE - COMPLETE 3+ VIEW  Comparison: None.  Findings: No acute bony or joint abnormality is identified.  The patient is status post hallux valgus repair.  Soft tissue structures are unremarkable.  IMPRESSION: No acute or focal abnormality.   Original Report Authenticated By: Holley Dexter, M.D.    Dg Foot Complete Right  04/28/2013  *RADIOLOGY REPORT*  Clinical Data: Rolled foot when walking, heard and felt to be of polyps, anterior ankle and generalized right foot pain  RIGHT FOOT  COMPLETE - 3+ VIEW  Comparison: None  Findings: Two K-wires are present at the distal first metatarsal post osteotomy. Small amount of mature periosteal new bone is present at the distal first metatarsal. Joint spaces preserved. Osseous mineralization grossly normal. No acute fracture, dislocation, or bone destruction.  IMPRESSION: Post osteotomy with K-wire fixation of the distal first metatarsal. No acute abnormalities.   Original Report Authenticated By: Ulyses Southward, M.D.      1. Ankle sprain, right, initial encounter  MDM  X-rays without evidence of fracture. No foot fracture the old surgery he appears healing well. No ankle fracture of tib-fib fracture. Most likely a lateral ankle sprain is the main cause of her pain. Patient improved some the emergency department with hydrocodone. Will discharge with crutches ASO followup with orthopedics. No other injuries.        Shelda Jakes, MD 04/29/13 [phone]

## 2013-04-29 NOTE — ED Notes (Signed)
The patient is AOx4 and comfortable with her discharge instructions.  Her companion is driving her home. 

## 2013-05-26 DIAGNOSIS — G4700 Insomnia, unspecified: Secondary | ICD-10-CM | POA: Insufficient documentation

## 2014-07-27 ENCOUNTER — Observation Stay (HOSPITAL_COMMUNITY)
Admission: EM | Admit: 2014-07-27 | Discharge: 2014-07-28 | Disposition: A | Discharge status: Home / Self Care | Payer: Managed Care, Other (non HMO) | Attending: Family Medicine | Admitting: Family Medicine

## 2014-07-27 ENCOUNTER — Emergency Department (HOSPITAL_COMMUNITY): Payer: Managed Care, Other (non HMO)

## 2014-07-27 DIAGNOSIS — R51 Headache: Secondary | ICD-10-CM

## 2014-07-27 DIAGNOSIS — G43919 Migraine, unspecified, intractable, without status migrainosus: Secondary | ICD-10-CM | POA: Diagnosis not present

## 2014-07-27 DIAGNOSIS — Z79899 Other long term (current) drug therapy: Secondary | ICD-10-CM | POA: Insufficient documentation

## 2014-07-27 DIAGNOSIS — Z87891 Personal history of nicotine dependence: Secondary | ICD-10-CM | POA: Diagnosis not present

## 2014-07-27 DIAGNOSIS — F4322 Adjustment disorder with anxiety: Secondary | ICD-10-CM | POA: Diagnosis not present

## 2014-07-27 DIAGNOSIS — R519 Headache, unspecified: Secondary | ICD-10-CM

## 2014-07-27 DIAGNOSIS — R42 Dizziness and giddiness: Secondary | ICD-10-CM

## 2014-07-27 DIAGNOSIS — R0789 Other chest pain: Secondary | ICD-10-CM

## 2014-07-27 LAB — CBC WITH DIFFERENTIAL/PLATELET
BASOS ABS: 0 10*3/uL (ref 0.0–0.1)
Basophils Relative: 0 % (ref 0–1)
Eosinophils Absolute: 0 10*3/uL (ref 0.0–0.7)
Eosinophils Relative: 0 % (ref 0–5)
HCT: 35.9 % — ABNORMAL LOW (ref 36.0–46.0)
Hemoglobin: 12.9 g/dL (ref 12.0–15.0)
LYMPHS ABS: 1 10*3/uL (ref 0.7–4.0)
LYMPHS PCT: 20 % (ref 12–46)
MCH: 31 pg (ref 26.0–34.0)
MCHC: 35.9 g/dL (ref 30.0–36.0)
MCV: 86.3 fL (ref 78.0–100.0)
Monocytes Absolute: 0.4 10*3/uL (ref 0.1–1.0)
Monocytes Relative: 9 % (ref 3–12)
NEUTROS ABS: 3.6 10*3/uL (ref 1.7–7.7)
NEUTROS PCT: 71 % (ref 43–77)
PLATELETS: 229 10*3/uL (ref 150–400)
RBC: 4.16 MIL/uL (ref 3.87–5.11)
RDW: 12.2 % (ref 11.5–15.5)
WBC: 5.1 10*3/uL (ref 4.0–10.5)

## 2014-07-27 LAB — LIPASE, BLOOD: Lipase: 19 U/L (ref 11–59)

## 2014-07-27 LAB — COMPREHENSIVE METABOLIC PANEL
ALBUMIN: 4.3 g/dL (ref 3.5–5.2)
ALK PHOS: 41 U/L (ref 39–117)
ALT: 28 U/L (ref 0–35)
ANION GAP: 17 — AB (ref 5–15)
AST: 26 U/L (ref 0–37)
BUN: 6 mg/dL (ref 6–23)
CALCIUM: 9 mg/dL (ref 8.4–10.5)
CO2: 24 mEq/L (ref 19–32)
Chloride: 91 mEq/L — ABNORMAL LOW (ref 96–112)
Creatinine, Ser: 0.63 mg/dL (ref 0.50–1.10)
GFR calc non Af Amer: >90 mL/min (ref >90)
GLUCOSE: 120 mg/dL — AB (ref 70–99)
POTASSIUM: 3.3 meq/L — AB (ref 3.7–5.3)
SODIUM: 132 meq/L — AB (ref 137–147)
TOTAL PROTEIN: 7.1 g/dL (ref 6.0–8.3)
Total Bilirubin: 0.4 mg/dL (ref 0.3–1.2)

## 2014-07-27 LAB — RAPID URINE DRUG SCREEN, HOSP PERFORMED
Amphetamines: NOT DETECTED
BARBITURATES: NOT DETECTED
Benzodiazepines: POSITIVE — AB
Cocaine: NOT DETECTED
Opiates: NOT DETECTED
Tetrahydrocannabinol: NOT DETECTED

## 2014-07-27 LAB — SALICYLATE LEVEL

## 2014-07-27 LAB — ETHANOL

## 2014-07-27 LAB — ACETAMINOPHEN LEVEL

## 2014-07-27 LAB — TROPONIN I: Troponin I: <0.3 ng/mL (ref <0.30)

## 2014-07-27 LAB — I-STAT TROPONIN, ED: TROPONIN I, POC: 0 ng/mL (ref 0.00–0.08)

## 2014-07-27 LAB — PROTIME-INR
INR: 1.12 (ref 0.00–1.49)
PROTHROMBIN TIME: 14.4 s (ref 11.6–15.2)

## 2014-07-27 LAB — APTT: APTT: 25 s (ref 24–37)

## 2014-07-27 MED ORDER — RISPERIDONE 3 MG PO TABS
6.0000 mg | ORAL_TABLET | Freq: Every day | ORAL | Status: DC
  Administered 2014-07-27: 6 mg via ORAL
  Filled 2014-07-27 (×2): qty 2

## 2014-07-27 MED ORDER — ONDANSETRON HCL 4 MG/2ML IJ SOLN: 4.0000 mg | Freq: Three times a day (TID) | INTRAMUSCULAR | Status: DC | PRN

## 2014-07-27 MED ORDER — FENTANYL CITRATE 0.05 MG/ML IJ SOLN
50.0000 ug | INTRAMUSCULAR | Status: AC | PRN
  Administered 2014-07-27: 50 ug via INTRAVENOUS
  Filled 2014-07-27: qty 2

## 2014-07-27 MED ORDER — METOCLOPRAMIDE HCL 5 MG/ML IJ SOLN
10.0000 mg | Freq: Once | INTRAMUSCULAR | Status: AC
  Administered 2014-07-27: 10 mg via INTRAVENOUS
  Filled 2014-07-27: qty 2

## 2014-07-27 MED ORDER — HEPARIN SODIUM (PORCINE) 5000 UNIT/ML IJ SOLN
5000.0000 [IU] | Freq: Three times a day (TID) | INTRAMUSCULAR | Status: DC
  Filled 2014-07-27: qty 1

## 2014-07-27 MED ORDER — ACETAMINOPHEN 325 MG PO TABS
650.0000 mg | ORAL_TABLET | Freq: Four times a day (QID) | ORAL | Status: DC | PRN
  Administered 2014-07-28: 650 mg via ORAL
  Filled 2014-07-27: qty 2

## 2014-07-27 MED ORDER — TRAZODONE HCL 100 MG PO TABS
100.0000 mg | ORAL_TABLET | Freq: Once | ORAL | Status: AC
  Administered 2014-07-27: 100 mg via ORAL
  Filled 2014-07-27: qty 1

## 2014-07-27 MED ORDER — PROMETHAZINE HCL 25 MG/ML IJ SOLN
25.0000 mg | Freq: Once | INTRAMUSCULAR | Status: AC
  Administered 2014-07-27: 25 mg via INTRAVENOUS
  Filled 2014-07-27: qty 1

## 2014-07-27 MED ORDER — KETOROLAC TROMETHAMINE 30 MG/ML IJ SOLN
30.0000 mg | Freq: Once | INTRAMUSCULAR | Status: AC
  Administered 2014-07-27: 30 mg via INTRAVENOUS
  Filled 2014-07-27: qty 1

## 2014-07-27 MED ORDER — DIPHENHYDRAMINE HCL 50 MG/ML IJ SOLN
25.0000 mg | Freq: Once | INTRAMUSCULAR | Status: AC
  Administered 2014-07-27: 25 mg via INTRAVENOUS
  Filled 2014-07-27: qty 1

## 2014-07-27 MED ORDER — FENTANYL CITRATE 0.05 MG/ML IJ SOLN
50.0000 ug | INTRAMUSCULAR | Status: DC | PRN
  Administered 2014-07-27 (×2): 50 ug via INTRAVENOUS
  Filled 2014-07-27 (×2): qty 2

## 2014-07-27 MED ORDER — ACETAMINOPHEN 500 MG PO TABS
1000.0000 mg | ORAL_TABLET | Freq: Once | ORAL | Status: AC
  Administered 2014-07-27: 1000 mg via ORAL
  Filled 2014-07-27: qty 2

## 2014-07-27 MED ORDER — RISPERIDONE 3 MG PO TABS
6.0000 mg | ORAL_TABLET | Freq: Every day | ORAL | Status: DC
  Filled 2014-07-27: qty 2

## 2014-07-27 MED ORDER — ONDANSETRON HCL 4 MG/2ML IJ SOLN
4.0000 mg | Freq: Three times a day (TID) | INTRAMUSCULAR | Status: DC | PRN
  Administered 2014-07-27: 4 mg via INTRAVENOUS
  Filled 2014-07-27: qty 2

## 2014-07-27 MED ORDER — MECLIZINE HCL 25 MG PO TABS
50.0000 mg | ORAL_TABLET | Freq: Once | ORAL | Status: AC
Start: 2014-07-27 — End: 2014-07-27
  Administered 2014-07-27: 50 mg via ORAL
  Filled 2014-07-27: qty 2

## 2014-07-27 MED ORDER — SODIUM CHLORIDE 0.9 % IV BOLUS (SEPSIS)
500.0000 mL | Freq: Once | INTRAVENOUS | Status: AC
  Administered 2014-07-27: 500 mL via INTRAVENOUS

## 2014-07-27 MED ORDER — SODIUM CHLORIDE 0.9 % IV SOLN
Freq: Once | INTRAVENOUS | Status: AC
  Administered 2014-07-27: 07:00:00 via INTRAVENOUS

## 2014-07-27 MED ORDER — SODIUM CHLORIDE 0.9 % IV SOLN
INTRAVENOUS | Status: AC
  Administered 2014-07-27: 12:00:00 via INTRAVENOUS

## 2014-07-27 MED ORDER — POTASSIUM CHLORIDE 10 MEQ/100ML IV SOLN
10.0000 meq | INTRAVENOUS | Status: AC
  Administered 2014-07-27 (×2): 10 meq via INTRAVENOUS
  Filled 2014-07-27 (×2): qty 100

## 2014-07-27 MED ORDER — SODIUM CHLORIDE 0.9 % IV BOLUS (SEPSIS)
1000.0000 mL | Freq: Once | INTRAVENOUS | Status: AC
  Administered 2014-07-27: 1000 mL via INTRAVENOUS

## 2014-07-27 MED ORDER — SODIUM CHLORIDE 0.45 % IV SOLN
INTRAVENOUS | Status: DC
  Administered 2014-07-27 – 2014-07-28 (×2): via INTRAVENOUS

## 2014-07-27 MED ORDER — ONDANSETRON HCL 4 MG/2ML IJ SOLN: 4.0000 mg | Freq: Once | INTRAMUSCULAR | Status: DC

## 2014-07-27 NOTE — ED Notes (Signed)
Witnessed waste with [place] Gross of 63mcg.  Pt not in the [place] at time of waste.

## 2014-07-27 NOTE — ED Provider Notes (Signed)
CSN: [id number]     Arrival date & time 07/27/14  0154 History   First MD Initiated Contact with Patient 07/27/14 0204     Chief Complaint  Patient presents with  . Nausea  . Emesis  . Headache     (Consider location/radiation/quality/duration/timing/severity/associated sxs/prior Treatment) HPI Patient presents with multiple complaints. She states that she began vomiting about 7 days ago and has been able to keep any fluids or solids down. She also developed a frontal headache 7 days ago. It was gradual onset. She states is not like her previous migraines. She states it differs because of the location. She then developed vertigo and bilateral ear ringing 3 days ago. She states this is worse with any movement. States she feels like the room is spinning. Today she states she is unable to ambulate due to to the spinning sensation. She also had some central chest pressure. She states she feels her heart is stretching. This does not radiate. Associated with shortness of breath. She was given 8 mg of Zofran in route by EMS. She states she still has nausea but has had no more vomiting. Denies any diarrhea. She complains of epigastric pain. Denies any blood or stool. Past Medical History  Diagnosis Date  . Diverticulitis of colon    Past Surgical History  Procedure Laterality Date  . Abdominal hysterectomy      preformed at [facility] by Dr Jacqualyn Posey in April  . Foot surgery     No family history on file. History  Substance Use Topics  . Smoking status: Former Smoker    Types: Cigarettes  . Smokeless tobacco: Not on file  . Alcohol Use: No   OB History   Grav Para Term Preterm Abortions TAB SAB Ect Mult Living                 Review of Systems  Constitutional: Positive for fatigue. Negative for fever and chills.  HENT: Negative for ear discharge, ear pain and hearing loss.   Eyes: Negative for photophobia and visual disturbance.  Respiratory: Negative for chest tightness and shortness  of breath.   Cardiovascular: Positive for chest pain. Negative for palpitations and leg swelling.  Gastrointestinal: Positive for nausea, vomiting and abdominal pain. Negative for diarrhea, constipation and blood in stool.  Genitourinary: Negative for dysuria, frequency and flank pain.  Musculoskeletal: Negative for arthralgias, back pain, myalgias, neck pain and neck stiffness.  Skin: Negative for rash and wound.  Neurological: Positive for dizziness, weakness (generalized) and headaches. Negative for tremors, syncope, speech difficulty, light-headedness and numbness.  All other systems reviewed and are negative.     Allergies  Codeine and Morphine and related  Home Medications   Prior to Admission medications   Medication Sig Start Date End Date Taking? Authorizing Provider  Oxcarbazepine (TRILEPTAL) 300 MG tablet Take 600 mg by mouth at bedtime.   Yes Historical Provider, MD  risperiDONE (RISPERDAL) 1 MG tablet Take 6 mg by mouth at bedtime.  03/07/13  Yes Norma Fredrickson, MD  albuterol (PROVENTIL HFA;VENTOLIN HFA) 108 (90 BASE) MCG/ACT inhaler Inhale 2 puffs into the lungs every 6 (six) hours as needed.    Historical Provider, MD   BP 132/88  Pulse 82  Temp(Src) 98.5 F (36.9 C) (Oral)  Resp 19  SpO2 97% Physical Exam  Nursing note and vitals reviewed. Constitutional: She is oriented to person, place, and time. She appears well-developed and well-nourished. No distress.  HENT:  Head: Normocephalic and atraumatic.  Mouth/Throat: Oropharynx  is clear and moist. No oropharyngeal exudate.  No sinus tenderness to percussion.  Eyes: EOM are normal. Pupils are equal, round, and reactive to light.  Rotary nystagmus.  Neck: Normal range of motion. Neck supple.  No meningismus.  Cardiovascular: Normal rate and regular rhythm.  Exam reveals no gallop and no friction rub.   No murmur heard. Pulmonary/Chest: Effort normal and breath sounds normal. No respiratory distress. She has no  wheezes. She has no rales. She exhibits no tenderness.  Abdominal: Soft. Bowel sounds are normal. She exhibits no distension and no mass. There is tenderness (epigastric tenderness to palpation.). There is no rebound and no guarding.  Musculoskeletal: Normal range of motion. She exhibits no edema and no tenderness.  No calf swelling or tenderness. No CVA tenderness.  Neurological: She is alert and oriented to person, place, and time.  5/5 motor in all extremities. Sensation is fully intact. Delayed and deliberate finger to nose. Patient appears mildly drowsy. She is oriented.  Skin: Skin is warm and dry. No rash noted. No erythema.    ED Course  Procedures (including critical care time) Labs Review Labs Reviewed  CBC WITH DIFFERENTIAL  COMPREHENSIVE METABOLIC PANEL  URINALYSIS, ROUTINE W REFLEX MICROSCOPIC  TROPONIN I  PROTIME-INR  APTT  ETHANOL  URINE RAPID DRUG SCREEN (HOSP PERFORMED)  SALICYLATE LEVEL  ACETAMINOPHEN LEVEL    Imaging Review No results found.   EKG Interpretation None      Date: 07/27/2014  Rate: 84  Rhythm: normal sinus rhythm  QRS Axis: normal  Intervals: normal  ST/T Wave abnormalities: normal  Conduction Disutrbances:none  Narrative Interpretation:   Old EKG Reviewed: unchanged   MDM   Final diagnoses:  None   Patient states her chest pain has resolved. Her headache is improved. She still has some dizziness but is improved. She's continue to vomit in the emergency department. She continues to be unable to ambulate due to dizziness. CT without any acute findings. Will likely need MRI and admission. Will discuss with hospitalist team.  Discussed with Dr. Benny Lennert. Asked to keep patient in the emergency department pending MRI.   Julianne Rice, MD 07/28/14 [phone]

## 2014-07-27 NOTE — ED Notes (Signed)
Report given to Karen RN

## 2014-07-27 NOTE — ED Notes (Signed)
EDP at bedside  

## 2014-07-27 NOTE — ED Notes (Signed)
Pt vomited x1.  

## 2014-07-27 NOTE — ED Notes (Signed)
Dr Yelverton at bedside.  

## 2014-07-27 NOTE — H&P (Signed)
[facility] Admission History and Physical Service Pager: [phone]  Patient name: Deborah Pena Medical record number: [id number] Date of birth: 06/20/1966 Age: 48 y.o. Gender: female  Primary Care Provider: Default, Provider, MD Consultants: None Code Status: Full as discussed on admission   Chief Complaint: Headache  Assessment and Plan: Deborah Pena is a 48 y.o. female presenting with headache, N/V x 7 days with associated vertigo and visual hallucination. PMH is significant for mood disorder NOS.   Headache: Associated with N/V, vertigo, and visual hallucinations.   CT of the head is negative for any acute abnormality. No focal neuro deficits on exam.  The ED physician was a little concerned about a NPH, however the patient is only incontinent of urine when vomiting. No fevers, leukocytosis, or exam findings concerning for CNS infection.  This does not sound like Meniere's or BPPV.  Her vertigo symptoms could be 2/2 dehydration with poor PO intake and N/V. At this time, I feel like she could be in status migranosus however her story is a little unclear.  - Admit to family medicine, attending Dr. Gwendlyn Deutscher - Maintenance IVFs - Will attempt IV toradol and IV Benadryl.  - May consider a DHE migraine cocktail of DHE, toradol, and Benadryl - Consider consulting neurology in the AM if there are no improvements in her symptoms.  Adjustment disorder with anxiety and depressed mood vs schizoaffective disorder, depressive type: Per notes from 2013/2014 at behavioral health, pt with a h/o severe depression accompanied by auditory and visual hallucinations in the past. Patient's home regimen doses seem very high, and unfortunately we are unable to contact [facility] [address] as [facility] is currently closed. Per patient, she currently sees Zettie Pho, NP at [facility]. - Holding Trileptal 600mg  qHS since patient states she does not take this medication  anymore - continue Risperdal 6mg  qHS  Lightheaded: appears to be more lightheadedness than dizziness. Although patient uses dizzy to describe symptoms, symptoms not consistent with vertigo. - orthostatics - follow-up symptoms and exam  FEN/GI: NS @ 100cc/hr Regular diet Prophylaxis: SQ heparin   Disposition: Admit under FPTS, attending Dr. Gwendlyn Deutscher  History of Present Illness: Deborah Pena is a 47 y.o. female presenting with 7 days of headache, nausea and vomiting.  She notes the "monster headache" came on gradually 7 days ago and was accompanied by N/V.  Her headache was frontal and aching in nature. She has been unable to eat for 7 days due to this N/V. She was unable to keep down medication (tylenol, nauzene).  She began having visual hallucinations of "things melting" 4 days ago which is worse when moving her head but present when sitting still. She feels this has resolved now. She also endorses vertigo: feeling as though the room is spinning and that she is spinning, which has since resolved. She has had urinary incontinence with vomiting, otherwise, has been able to control her bladder/bowel.  She has been unable to walk without assistance because she feels so weak from not eating.  The patient tells me that her daughter said she was speaking gibberish and acting confused today. She notes that she would blatantly tell lies when she didn't know the answer. Her last alcoholic drink was 4 months ago and she denies a strong alcohol past. She has never had symptoms like this in the past.  In the ED, the patient was given phenergan which resolved her nausea to the point where she was able to eat >75%  of her meal without emesis. She was also given meclizine which improved her vertigo symptoms. She did not feel that the Tylenol or Toradol helped her headache, which she continues to endorse.    Review Of Systems:  Otherwise 12 point review of systems was performed and was unremarkable.  Patient  Active Problem List   Diagnosis Date Noted  . Intractable migraine 07/27/2014  . Adjustment disorder with anxiety 12/06/2012   Past Medical History: Past Medical History  Diagnosis Date  . Diverticulitis of colon    Past Surgical History: Past Surgical History  Procedure Laterality Date  . Abdominal hysterectomy      preformed at [facility] by Dr Jacqualyn Posey in April  . Foot surgery     Social History: History  Substance Use Topics  . Smoking status: Former Smoker    Types: Cigarettes  . Smokeless tobacco: Not on file  . Alcohol Use: No   Additional social history: Does not drink. Denies smoking or past drug use.  Please also refer to relevant sections of EMR.  Family History: No family history on file. Allergies and Medications: Allergies  Allergen Reactions  . Codeine Itching    Itches from inside out  . Morphine And Related Other (See Comments)    Becomes phsycotic   No current facility-administered medications on file prior to encounter.   No current outpatient prescriptions on file prior to encounter.    Objective: BP 130/70  Pulse 92  Temp(Src) 99.8 F (37.7 C) (Oral)  Resp 20  SpO2 100% Exam: General -- oriented x3, lying in bed in NAD.  HEENT -- Head is normocephalic. Pupils equal and round and react to light and accommodation. Extraocular motions are intact. Oropharynx clear. MMM. Could not appreciate nystagmus Integument -- intact. No rash, erythema, or ecchymoses noted Chest -- Good expansion. Lungs clear to auscultation. No wheezing, rhonchi, or crackles noted. Cardiac -- RRR. No murmurs, rubs, or gallops noted. No LE edema noted. Abdomen -- +BS. soft, non-distended, nontender.  Musculoskeletal - No gross deformities noted.  CNS -- Facial movement symmetric to smile and eyebrow movement. Able to touch chin to chest without pain. 5/5 hand grip. 5/5 strength in the upper and lower extremities b/l. Gait not assessed.    Labs and Imaging: CBC BMET    Recent Labs Lab 07/27/14 0247  WBC 5.1  HGB 12.9  HCT 35.9*  PLT 229    Recent Labs Lab 07/27/14 0247  NA 132*  K 3.3*  CL 91*  CO2 24  BUN 6  CREATININE 0.63  GLUCOSE 120*  CALCIUM 9.0     Ct Head Wo Contrast  07/27/2014 CLINICAL DATA: Headache for 3 days. Vomiting and increased weakness. EXAM: CT HEAD WITHOUT CONTRAST TECHNIQUE: Contiguous axial images were obtained from the base of the skull through the vertex without intravenous contrast. COMPARISON: CT of the head performed 08/03/2008 FINDINGS: There is no evidence of acute infarction, mass lesion, or intra- or extra-axial hemorrhage on CT. The posterior fossa, including the cerebellum, brainstem and fourth ventricle, is within normal limits. The third and lateral ventricles, and basal ganglia are unremarkable in appearance. The cerebral hemispheres are symmetric in appearance, with normal gray-white differentiation. No mass effect or midline shift is seen. There is no evidence of fracture; visualized osseous structures are unremarkable in appearance. The visualized portions of the orbits are within normal limits. The paranasal sinuses and mastoid air cells are well-aerated. No significant soft tissue abnormalities are seen. IMPRESSION: Unremarkable noncontrast CT of the  head. Electronically Signed By: Garald Balding M.D. On: 07/27/2014 03:30   Mr Brain Wo Contrast  07/27/2014 CLINICAL DATA: 48 year old female with persistent headache, nausea, vomiting, vertigo. Initial encounter. EXAM: MRI HEAD WITHOUT CONTRAST TECHNIQUE: Multiplanar, multiecho pulse sequences of the brain and surrounding structures were obtained without intravenous contrast. COMPARISON: Head CTs without contrast 0305 hr today and earlier. FINDINGS: Cerebral volume is normal. No restricted diffusion to suggest acute infarction. No midline shift, mass effect, evidence of mass lesion, ventriculomegaly, extra-axial collection or acute intracranial hemorrhage.  Cervicomedullary junction and pituitary are within normal limits. Major intracranial vascular flow voids Gray and white matter signal is within normal limits throughout the brain. Visible internal auditory structures appear normal. Normal non contrast appearance of the cavernous sinus. Visualized orbit soft tissues are within normal limits. Visualized paranasal sinuses and mastoids are clear. Visualized scalp soft tissues are within normal limits. Negative visualized cervical spine. Normal bone marrow signal. IMPRESSION: Normal non contrast MRI appearance of the brain. Electronically Signed By: Lars Pinks M.D. On: 07/27/2014 09:58     Archie Patten, MD 07/27/2014, 6:57 PM PGY-1, [place] Intern pager: [phone], text pages welcome

## 2014-07-27 NOTE — ED Notes (Addendum)
Per [facility], pt has vomiting x 6 days with increase weakness. Yesterday, pt stated that she was no longer able to walk due to weakness. She stated that she also started having a generalized headache x 3 days. No additional neurological changes or deficits. Pt begin having chest palpations since last night. Pt has left lower abdominal pain. Tender to touch. Per daughter, pt has an addiction to prescription drugs and questionable recreational drugs. Pt denies. Pt received total of 8mg  Zofran by EMS. No respiratory distress.

## 2014-07-27 NOTE — ED Notes (Signed)
Pt transported to MRI 

## 2014-07-27 NOTE — ED Provider Notes (Signed)
Patient signed out to followup MRI and reassess the patient to send home if feel improved. Patient has worsening headache gradual onset with nausea vomiting and vertigo worsening for the past week. Patient multiple migraine cocktail the ER with minimal improvement. MRI results reviewed no acute findings, recheck patient she is not feel any improvement. Discussed the case with resident physician for admission for further treatment and evaluation. No chest pain in ED.  Troponins neg.   Ct Head Wo Contrast  07/27/2014   CLINICAL DATA:  Headache for 3 days. Vomiting and increased weakness.  EXAM: CT HEAD WITHOUT CONTRAST  TECHNIQUE: Contiguous axial images were obtained from the base of the skull through the vertex without intravenous contrast.  COMPARISON:  CT of the head performed 08/03/2008  FINDINGS: There is no evidence of acute infarction, mass lesion, or intra- or extra-axial hemorrhage on CT.  The posterior fossa, including the cerebellum, brainstem and fourth ventricle, is within normal limits. The third and lateral ventricles, and basal ganglia are unremarkable in appearance. The cerebral hemispheres are symmetric in appearance, with normal gray-white differentiation. No mass effect or midline shift is seen.  There is no evidence of fracture; visualized osseous structures are unremarkable in appearance. The visualized portions of the orbits are within normal limits. The paranasal sinuses and mastoid air cells are well-aerated. No significant soft tissue abnormalities are seen.  IMPRESSION: Unremarkable noncontrast CT of the head.   Electronically Signed   By: Garald Balding M.D.   On: 07/27/2014 03:30   Mr Brain Wo Contrast  07/27/2014   CLINICAL DATA:  48 year old female with persistent headache, nausea, vomiting, vertigo. Initial encounter.  EXAM: MRI HEAD WITHOUT CONTRAST  TECHNIQUE: Multiplanar, multiecho pulse sequences of the brain and surrounding structures were obtained without intravenous  contrast.  COMPARISON:  Head CTs without contrast 0305 hr today and earlier.  FINDINGS: Cerebral volume is normal. No restricted diffusion to suggest acute infarction. No midline shift, mass effect, evidence of mass lesion, ventriculomegaly, extra-axial collection or acute intracranial hemorrhage. Cervicomedullary junction and pituitary are within normal limits. Major intracranial vascular flow voids Gray and white matter signal is within normal limits throughout the brain. Visible internal auditory structures appear normal.  Normal non contrast appearance of the cavernous sinus. Visualized orbit soft tissues are within normal limits. Visualized paranasal sinuses and mastoids are clear. Visualized scalp soft tissues are within normal limits. Negative visualized cervical spine. Normal bone marrow signal.  IMPRESSION: Normal non contrast MRI appearance of the brain.   Electronically Signed   By: Lars Pinks M.D.   On: 07/27/2014 09:58   Vertigo, Headache, vomiting, atypical chest pain  Mariea Clonts, MD 07/27/14 1534

## 2014-07-27 NOTE — ED Notes (Signed)
C/o HA and nausea.

## 2014-07-27 NOTE — ED Notes (Signed)
Dr. Lita Mains made aware of pt sensory deficit to the right side.  Pt concerned with still feeling nauseated and in need of medication for headache.

## 2014-07-27 NOTE — ED Notes (Signed)
Pt stated to this RN she has not eaten anything due to feeling nauseated and vomiting for the past 6-7 days.  Pt states she is so hunger.  This RN gave pt crackers and a Ginger Ale, pt appears to be tolerating well, no emesis at this time.  Pt repeatedly keeps asking when she can have her next dose of Phenergan, this RN states "lets see if you tolerate the crackers provided and see if this helps to curve your nausea".

## 2014-07-28 ENCOUNTER — Encounter (HOSPITAL_COMMUNITY): Payer: Self-pay | Admitting: *Deleted

## 2014-07-28 LAB — BASIC METABOLIC PANEL
Anion gap: 14 (ref 5–15)
BUN: 9 mg/dL (ref 6–23)
CHLORIDE: 101 meq/L (ref 96–112)
CO2: 22 mEq/L (ref 19–32)
CREATININE: 0.73 mg/dL (ref 0.50–1.10)
Calcium: 8.2 mg/dL — ABNORMAL LOW (ref 8.4–10.5)
Glucose, Bld: 86 mg/dL (ref 70–99)
Potassium: 3.8 mEq/L (ref 3.7–5.3)
Sodium: 137 mEq/L (ref 137–147)

## 2014-07-28 LAB — CBC
HCT: 33.4 % — ABNORMAL LOW (ref 36.0–46.0)
Hemoglobin: 11.7 g/dL — ABNORMAL LOW (ref 12.0–15.0)
MCH: 31 pg (ref 26.0–34.0)
MCHC: 35 g/dL (ref 30.0–36.0)
MCV: 88.6 fL (ref 78.0–100.0)
PLATELETS: 241 10*3/uL (ref 150–400)
RBC: 3.77 MIL/uL — ABNORMAL LOW (ref 3.87–5.11)
RDW: 12.5 % (ref 11.5–15.5)
WBC: 4.6 10*3/uL (ref 4.0–10.5)

## 2014-07-28 MED ORDER — ONDANSETRON HCL 4 MG PO TABS: 4.0000 mg | ORAL_TABLET | Freq: Three times a day (TID) | ORAL | Status: DC | PRN

## 2014-07-28 MED ORDER — ONDANSETRON HCL 4 MG/2ML IJ SOLN: 4.0000 mg | Freq: Three times a day (TID) | INTRAMUSCULAR | Status: DC | PRN

## 2014-07-28 NOTE — Progress Notes (Signed)
UR Completed.  Deborah Pena [id number] 07/28/2014

## 2014-07-28 NOTE — Progress Notes (Signed)
FMTS ATTENDING  NOTE Vershawn Westrup,MD I  have seen and examined this patient, reviewed their chart. I have discussed this patient with the resident. I agree with the resident's findings, assessment and care plan. 

## 2014-07-28 NOTE — Discharge Instructions (Signed)

## 2014-07-28 NOTE — Progress Notes (Signed)
Family Medicine Teaching Service Daily Progress Note Intern Pager: [phone]  Patient name: Deborah Pena Medical record number: [id number] Date of birth: 02-20-1966 Age: 48 y.o. Gender: female  Primary Care Provider: Default, Provider, MD Consultants: none Code Status: Full   Pt Overview and Major Events to Date:  8/8: admitted for headache  Assessment and Plan: Deborah Pena is a 48 y.o. female presenting with headache, N/V x 7 days with associated vertigo and visual hallucination. PMH is significant for mood disorder NOS.   #Headache: reports no headache this morning and no hallucinations. She denies any SI.  - f/u with [facility]   #Adjustment disorder with anxiety and depressed mood vs schizoaffective disorder, depressive type: Per notes from 2013/2014 at behavioral health, pt with a h/o severe depression accompanied by auditory and visual hallucinations in the past.  - [facility] [address]. They report patient most recently filled trileptal and haven't filled Risperdal since March   #Lightheaded: Improved. - follow-up symptoms and exam   FEN/GI: NS @ 100cc/hr Regular diet  Prophylaxis: SQ heparin   Disposition: pending improvement   Subjective:  Reports no problems today. Denies any hallucinations.  Wants to go home.   Objective: Temp:  [97.8 F (36.6 C)-99.8 F (37.7 C)] 98.2 F (36.8 C) (08/09 0617) Pulse Rate:  [70-95] 72 (08/09 0617) Resp:  [10-20] 20 (08/09 0617) BP: (103-130)/(48-81) 105/54 mmHg (08/09 0617) SpO2:  [98 %-100 %] 99 % (08/09 0617) Physical Exam: General -- oriented x3, lying in bed in NAD.  HEENT -- Head is normocephalic. Pupils equal and round and react to light and accommodation. Extraocular motions are intact. Oropharynx clear. MMM.   Chest -- Good expansion. Lungs clear to auscultation. No wheezing, rhonchi, or crackles noted.  Cardiac -- RRR. No murmurs, rubs, or gallops noted. No LE edema noted. .  Musculoskeletal - No  gross deformities noted.  CNS -- denies any HI or SI    Laboratory:  Recent Labs Lab 07/27/14 0247 07/28/14 0420  WBC 5.1 4.6  HGB 12.9 11.7*  HCT 35.9* 33.4*  PLT 229 241    Recent Labs Lab 07/27/14 0247 07/28/14 0420  NA 132* 137  K 3.3* 3.8  CL 91* 101  CO2 24 22  BUN 6 9  CREATININE 0.63 0.73  CALCIUM 9.0 8.2*  PROT 7.1  --   BILITOT 0.4  --   ALKPHOS 41  --   ALT 28  --   AST 26  --   GLUCOSE 120* 86     Imaging/Diagnostic Tests: Ct Head Wo Contrast  07/27/2014  IMPRESSION: Unremarkable noncontrast CT of the head.   Mr Brain Wo Contrast  07/27/2014  IMPRESSION: Normal non contrast MRI appearance of the brain.   Rosemarie Ax, MD 07/28/2014, 9:20 AM PGY-2, [place] Intern pager: [phone], text pages welcome

## 2014-07-28 NOTE — Progress Notes (Signed)
Patient discharged home, family at bedside, refused wheelchair, decided to ambulate with family, escorted by tech, denies any pain, zofran called to pharmacy, patient makes own follow up appointment with primary

## 2014-07-28 NOTE — Discharge Summary (Signed)
[facility] Discharge Summary  Patient name: Deborah Pena Medical record number: [id number] Date of birth: [birth year] Age: 48 y.o. Gender: female Date of Admission: 07/27/2014  Date of Discharge: 07/28/14 Admitting Physician: Andrena Mews, MD  Primary Care Provider: Default, Provider, MD Consultants: none  Indication for Hospitalization: headache   Discharge Diagnoses/Problem List:  Patient Active Problem List   Diagnosis Date Noted  . Intractable migraine 07/27/2014  . Adjustment disorder with anxiety 12/06/2012   Disposition: home   Discharge Condition: stable   Discharge Exam:  General -- oriented x3, lying in bed in NAD.  HEENT -- Head is normocephalic. Pupils equal and round and react to light and accommodation. Extraocular motions are intact. Oropharynx clear. MMM.  Chest -- Good expansion. Lungs clear to auscultation. No wheezing, rhonchi, or crackles noted.  Cardiac -- RRR. No murmurs, rubs, or gallops noted. No LE edema noted. .  Musculoskeletal - No gross deformities noted.  CNS -- denies any HI or SI    Brief Hospital Course:  Deborah Pena is a 48 y.o. female presenting with headache, N/V x 7 days with associated vertigo and visual hallucination. PMH is significant for mood disorder NOS.   #Headache: This was present on admission and thought to be associated with her other symptoms. imaging was normal. Most likely a migraine in origin. Symptoms had completely dissipated prior to discharge.   #Adjustment disorder with anxiety and depressed mood vs schizoaffective disorder, depressive type: Per notes from 2013/2014 at behavioral health, pt with a h/o severe depression accompanied by auditory and visual hallucinations in the past. Denies any SI or HI. She denied any hallucinations prior to discharge. She has follow up with Zettie Pho, NP at [facility], in the near future.   #Lightheaded: Reports having dizziness upon presentation  that were not consistent with vertigo. These symptoms had subsided prior to discharge.   Issues for Follow Up:  1. Pscyh: Patient's pharmacy was called. They reported that trileptal was the last medication she had filled and hadn't filled Risperdal since March.   Significant Procedures: none  Significant Labs and Imaging:   Recent Labs Lab 07/27/14 0247 07/28/14 0420  WBC 5.1 4.6  HGB 12.9 11.7*  HCT 35.9* 33.4*  PLT 229 241    Recent Labs Lab 07/27/14 0247 07/28/14 0420  NA 132* 137  K 3.3* 3.8  CL 91* 101  CO2 24 22  GLUCOSE 120* 86  BUN 6 9  CREATININE 0.63 0.73  CALCIUM 9.0 8.2*  ALKPHOS 41  --   AST 26  --   ALT 28  --   ALBUMIN 4.3  --     Ct Head Wo Contrast  07/27/2014  IMPRESSION: Unremarkable noncontrast CT of the head.   Mr Brain Wo Contrast  07/27/2014  IMPRESSION: Normal non contrast MRI appearance of the brain.  Results/Tests Pending at Time of Discharge: none  Discharge Medications:    Medication List    STOP taking these medications       RISPERDAL 3 MG tablet  Generic drug:  risperiDONE      TAKE these medications       acetaminophen 500 MG tablet  Commonly known as:  TYLENOL  Take 1,000 mg by mouth every 6 (six) hours as needed (pain).     dexmethylphenidate 15 MG 24 hr capsule  Commonly known as:  FOCALIN XR  Take 15 mg by mouth daily.     ondansetron 4 MG/2ML Soln injection  Commonly known  as:  ZOFRAN  Inject 2 mLs (4 mg total) into the vein every 8 (eight) hours as needed for nausea or vomiting.     Oxcarbazepine 300 MG tablet  Commonly known as:  TRILEPTAL  Take 600 mg by mouth at bedtime.        Discharge Instructions: Please refer to Patient Instructions section of EMR for full details.  Patient was counseled important signs and symptoms that should prompt return to medical care, changes in medications, dietary instructions, activity restrictions, and follow up appointments.   Follow-Up Appointments:     Follow-up  Information   Follow up with POULOS,LISA. (as scheduled)       Rosemarie Ax, MD 07/28/2014, 1:51 PM PGY-2, [place]

## 2014-07-28 NOTE — H&P (Signed)
FMTS ATTENDING ADMISSION NOTE Kevork Joyce,MD I  have seen and examined this patient, reviewed their chart. I have discussed this patient with the resident. I agree with the resident's findings, assessment and care plan.  48 Y/O F with PMX of adjustment disorder with anxiety presented with hx of severe headache for about 1 wk, she normal have migraine HA for years which is recurrent but this is different for her, she had episode of N/V 2 days ago, denies change in vision but was having visual hallucination on and off which she stated has now stopped. She feels better with her HA now and is asking to go home.  No current facility-administered medications on file prior to encounter.   No current outpatient prescriptions on file prior to encounter.   Past Medical History  Diagnosis Date  . Diverticulitis of colon    Filed Vitals:   07/27/14 2057 07/28/14 0025 07/28/14 0617 07/28/14 1029  BP: 115/69 110/68 105/54 115/62  Pulse: 81 80 72 76  Temp: 98.5 F (36.9 C) 97.8 F (36.6 C) 98.2 F (36.8 C) 98.6 F (37 C)  TempSrc: Oral Oral Oral Oral  Resp: 20 18 20 20   SpO2:  99% 99% 98%    Exam:  Gen: Calm in bed, not in distress. Psy: Not Psychotic,not hallucinating, not suicidal.Mood normal. Neuro: Awake and alert, oriented x 3. Resp: Air entry equal ,no wheezing. CV: S1 S2 normal,no murmur. Abd: Soft, NT/ND, bowel sound normal. Ext: No edema.  A/P: 48 Y/O F with  1. Severe Headache: Likely severe migraine.     MRI brain reviewed with no acute pathology.       Symptom improved.     I agree with Toradol, + Benadryl prn HA.     Neuro check.  2.  Adjustment disorder with anxiety.      Had visual hallucination prior to admission which has since then resolved.     Not suicidal.     Risperidone continued.     Monitor on medication.

## 2014-07-28 NOTE — Discharge Summary (Signed)
FMTS ATTENDING  NOTE Deborah Cammarata,MD I  have seen and examined this patient, reviewed their chart. I have discussed this patient with the resident. I agree with the resident's findings, assessment and care plan. 

## 2015-07-07 ENCOUNTER — Ambulatory Visit: Payer: Self-pay | Admitting: Licensed Clinical Social Worker

## 2015-09-06 ENCOUNTER — Other Ambulatory Visit (HOSPITAL_COMMUNITY): Payer: Self-pay | Admitting: Psychiatry

## 2015-12-17 ENCOUNTER — Emergency Department (HOSPITAL_COMMUNITY)
Admission: EM | Admit: 2015-12-17 | Discharge: 2015-12-17 | Disposition: A | Discharge status: Home / Self Care | Payer: Managed Care, Other (non HMO) | Attending: Emergency Medicine | Admitting: Emergency Medicine

## 2015-12-17 ENCOUNTER — Encounter (HOSPITAL_COMMUNITY): Payer: Self-pay

## 2015-12-17 ENCOUNTER — Emergency Department (HOSPITAL_COMMUNITY): Payer: Managed Care, Other (non HMO)

## 2015-12-17 DIAGNOSIS — Z79899 Other long term (current) drug therapy: Secondary | ICD-10-CM | POA: Diagnosis not present

## 2015-12-17 DIAGNOSIS — Z3202 Encounter for pregnancy test, result negative: Secondary | ICD-10-CM | POA: Insufficient documentation

## 2015-12-17 DIAGNOSIS — Z87891 Personal history of nicotine dependence: Secondary | ICD-10-CM | POA: Insufficient documentation

## 2015-12-17 DIAGNOSIS — K5732 Diverticulitis of large intestine without perforation or abscess without bleeding: Secondary | ICD-10-CM | POA: Diagnosis not present

## 2015-12-17 DIAGNOSIS — Z90710 Acquired absence of both cervix and uterus: Secondary | ICD-10-CM | POA: Diagnosis not present

## 2015-12-17 DIAGNOSIS — R1032 Left lower quadrant pain: Secondary | ICD-10-CM | POA: Diagnosis present

## 2015-12-17 LAB — CBC
HEMATOCRIT: 40 % (ref 36.0–46.0)
HEMOGLOBIN: 13.5 g/dL (ref 12.0–15.0)
MCH: 30.6 pg (ref 26.0–34.0)
MCHC: 33.8 g/dL (ref 30.0–36.0)
MCV: 90.7 fL (ref 78.0–100.0)
Platelets: 202 10*3/uL (ref 150–400)
RBC: 4.41 MIL/uL (ref 3.87–5.11)
RDW: 12.1 % (ref 11.5–15.5)
WBC: 5.7 10*3/uL (ref 4.0–10.5)

## 2015-12-17 LAB — URINALYSIS, ROUTINE W REFLEX MICROSCOPIC
Bilirubin Urine: NEGATIVE
Glucose, UA: NEGATIVE mg/dL
Hgb urine dipstick: NEGATIVE
Ketones, ur: NEGATIVE mg/dL
Leukocytes, UA: NEGATIVE
Nitrite: NEGATIVE
Protein, ur: NEGATIVE mg/dL
Specific Gravity, Urine: 1.008 (ref 1.005–1.030)
pH: 5.5 (ref 5.0–8.0)

## 2015-12-17 LAB — COMPREHENSIVE METABOLIC PANEL
ALT: 39 U/L (ref 14–54)
ANION GAP: 9 (ref 5–15)
AST: 33 U/L (ref 15–41)
Albumin: 4.4 g/dL (ref 3.5–5.0)
Alkaline Phosphatase: 38 U/L (ref 38–126)
BUN: 9 mg/dL (ref 6–20)
CHLORIDE: 110 mmol/L (ref 101–111)
CO2: 23 mmol/L (ref 22–32)
Calcium: 9.5 mg/dL (ref 8.9–10.3)
Creatinine, Ser: 0.83 mg/dL (ref 0.44–1.00)
Glucose, Bld: 107 mg/dL — ABNORMAL HIGH (ref 65–99)
POTASSIUM: 3.9 mmol/L (ref 3.5–5.1)
SODIUM: 142 mmol/L (ref 135–145)
Total Bilirubin: 0.6 mg/dL (ref 0.3–1.2)
Total Protein: 6.8 g/dL (ref 6.5–8.1)

## 2015-12-17 LAB — POC URINE PREG, ED: Preg Test, Ur: NEGATIVE

## 2015-12-17 LAB — LIPASE, BLOOD: Lipase: 39 U/L (ref 11–51)

## 2015-12-17 MED ORDER — HYDROMORPHONE HCL 1 MG/ML IJ SOLN
1.0000 mg | Freq: Once | INTRAMUSCULAR | Status: AC
  Administered 2015-12-17: 1 mg via INTRAVENOUS
  Filled 2015-12-17: qty 1

## 2015-12-17 MED ORDER — FENTANYL CITRATE (PF) 100 MCG/2ML IJ SOLN
100.0000 ug | Freq: Once | INTRAMUSCULAR | Status: AC
  Administered 2015-12-17: 100 ug via INTRAVENOUS
  Filled 2015-12-17: qty 2

## 2015-12-17 MED ORDER — IOHEXOL 300 MG/ML  SOLN
25.0000 mL | Freq: Once | INTRAMUSCULAR | Status: AC | PRN
  Administered 2015-12-17: 25 mL via ORAL

## 2015-12-17 MED ORDER — METRONIDAZOLE 500 MG PO TABS
500.0000 mg | ORAL_TABLET | Freq: Once | ORAL | Status: AC
  Administered 2015-12-17: 500 mg via ORAL
  Filled 2015-12-17: qty 1

## 2015-12-17 MED ORDER — IOHEXOL 300 MG/ML  SOLN
150.0000 mL | Freq: Once | INTRAMUSCULAR | Status: AC | PRN
  Administered 2015-12-17: 100 mL via INTRAVENOUS

## 2015-12-17 MED ORDER — CIPROFLOXACIN HCL 500 MG PO TABS: 500.0000 mg | ORAL_TABLET | Freq: Two times a day (BID) | ORAL | Status: DC

## 2015-12-17 MED ORDER — OXYCODONE-ACETAMINOPHEN 5-325 MG PO TABS: 2.0000 | ORAL_TABLET | ORAL | Status: DC | PRN

## 2015-12-17 MED ORDER — ONDANSETRON HCL 4 MG/2ML IJ SOLN
4.0000 mg | Freq: Once | INTRAMUSCULAR | Status: AC
  Administered 2015-12-17: 4 mg via INTRAVENOUS
  Filled 2015-12-17: qty 2

## 2015-12-17 MED ORDER — ONDANSETRON HCL 4 MG PO TABS: 4.0000 mg | ORAL_TABLET | Freq: Four times a day (QID) | ORAL | Status: DC

## 2015-12-17 MED ORDER — SODIUM CHLORIDE 0.9 % IV BOLUS (SEPSIS)
500.0000 mL | Freq: Once | INTRAVENOUS | Status: AC
  Administered 2015-12-17: 500 mL via INTRAVENOUS

## 2015-12-17 MED ORDER — METRONIDAZOLE 500 MG PO TABS: 500.0000 mg | ORAL_TABLET | Freq: Four times a day (QID) | ORAL | Status: DC

## 2015-12-17 MED ORDER — FENTANYL CITRATE (PF) 100 MCG/2ML IJ SOLN
100.0000 ug | Freq: Once | INTRAMUSCULAR | Status: DC
  Filled 2015-12-17: qty 2

## 2015-12-17 MED ORDER — SODIUM CHLORIDE 0.9 % IV BOLUS (SEPSIS)
1000.0000 mL | Freq: Once | INTRAVENOUS | Status: AC
  Administered 2015-12-17: 1000 mL via INTRAVENOUS

## 2015-12-17 MED ORDER — HYDROMORPHONE HCL 1 MG/ML IJ SOLN
INTRAMUSCULAR | Status: AC
  Administered 2015-12-17: 1 mg via INTRAVENOUS
  Filled 2015-12-17: qty 1

## 2015-12-17 MED ORDER — CIPROFLOXACIN HCL 500 MG PO TABS
500.0000 mg | ORAL_TABLET | Freq: Once | ORAL | Status: AC
  Administered 2015-12-17: 500 mg via ORAL
  Filled 2015-12-17: qty 1

## 2015-12-17 NOTE — ED Provider Notes (Signed)
CSN: [id number]     Arrival date & time 12/17/15  0944 History   First MD Initiated Contact with Patient 12/17/15 1201     Chief Complaint  Patient presents with  . Abdominal Pain     (Consider location/radiation/quality/duration/timing/severity/associated sxs/prior Treatment) HPI..... Sharp left lower quadrant pain radiating to the back for approximate one month getting worse. Decreased appetite. Nausea but no vomiting. No blood or mucus in the stool. Patient claims to be normally healthy. Status post hysterectomy. Patient evaluated once by an acute care center and treated with Cipro for uncertain diagnosis. No history of diverticulitis.  Past Medical History  Diagnosis Date  . Diverticulitis of colon    Past Surgical History  Procedure Laterality Date  . Abdominal hysterectomy      preformed at [facility] by Dr Jacqualyn Posey in April  . Foot surgery     No family history on file. Social History  Substance Use Topics  . Smoking status: Former Smoker    Types: Cigarettes  . Smokeless tobacco: None  . Alcohol Use: No   OB History    No data available     Review of Systems  All other systems reviewed and are negative.     Allergies  Codeine and Morphine and related  Home Medications   Prior to Admission medications   Medication Sig Start Date End Date Taking? Authorizing Provider  acetaminophen (TYLENOL) 500 MG tablet Take 1,000 mg by mouth every 6 (six) hours as needed (pain).   Yes Historical Provider, MD  ALPRAZolam Duanne Moron) 1 MG tablet Take 1 mg by mouth 2 (two) times daily as needed. Anxiety/sleep 11/07/15  Yes Historical Provider, MD  dexmethylphenidate (FOCALIN XR) 20 MG 24 hr capsule Take 20 mg by mouth 2 (two) times daily.   Yes Historical Provider, MD  dexmethylphenidate (FOCALIN) 10 MG tablet Take 10-20 mg by mouth daily as needed (in between focalin XR 20mg  if needed for ADHD).    Yes Historical Provider, MD  HYDROcodone-acetaminophen (NORCO/VICODIN) 5-325 MG  tablet Take 1 tablet by mouth 4 (four) times daily as needed. pain 12/12/15  Yes Historical Provider, MD  risperiDONE (RISPERDAL) 3 MG tablet Take 9 mg by mouth at bedtime.   Yes Historical Provider, MD  temazepam (RESTORIL) 15 MG capsule Take 15 mg by mouth at bedtime as needed. sleep 11/09/15  Yes Historical Provider, MD  traZODone (DESYREL) 150 MG tablet Take 150 mg by mouth at bedtime. 11/04/15  Yes Historical Provider, MD  ciprofloxacin (CIPRO) 500 MG tablet Take 1 tablet (500 mg total) by mouth 2 (two) times daily. 12/17/15   Nat Christen, MD  metroNIDAZOLE (FLAGYL) 500 MG tablet Take 1 tablet (500 mg total) by mouth 4 (four) times daily. 12/17/15   Nat Christen, MD  ondansetron (ZOFRAN) 4 MG tablet Take 1 tablet (4 mg total) by mouth every 6 (six) hours. 12/17/15   Nat Christen, MD  oxyCODONE-acetaminophen (PERCOCET) 5-325 MG tablet Take 2 tablets by mouth every 4 (four) hours as needed. 12/17/15   Nat Christen, MD   BP 121/86 mmHg  Pulse 72  Temp(Src) 97.4 F (36.3 C) (Oral)  Resp 15  SpO2 95% Physical Exam  Constitutional: She is oriented to person, place, and time. She appears well-developed and well-nourished.  HENT:  Head: Normocephalic and atraumatic.  Eyes: Conjunctivae and EOM are normal. Pupils are equal, round, and reactive to light.  Neck: Normal range of motion. Neck supple.  Cardiovascular: Normal rate and regular rhythm.   Pulmonary/Chest: Effort normal  and breath sounds normal.  Abdominal: Soft. Bowel sounds are normal.  Tender left lower quadrant.  Musculoskeletal: Normal range of motion.  Neurological: She is alert and oriented to person, place, and time.  Skin: Skin is warm and dry.  Psychiatric: She has a normal mood and affect. Her behavior is normal.  Nursing note and vitals reviewed.   ED Course  Procedures (including critical care time) Labs Review Labs Reviewed  COMPREHENSIVE METABOLIC PANEL - Abnormal; Notable for the following:    Glucose, Bld 107 (*)     All other components within normal limits  LIPASE, BLOOD  CBC  URINALYSIS, ROUTINE W REFLEX MICROSCOPIC (NOT AT [facility])  POC URINE PREG, ED    Imaging Review Ct Abdomen Pelvis W Contrast  12/17/2015  CLINICAL DATA:  Abdominal pain and diarrhea. EXAM: CT ABDOMEN AND PELVIS WITH CONTRAST TECHNIQUE: Multidetector CT imaging of the abdomen and pelvis was performed using the standard protocol following bolus administration of intravenous contrast. CONTRAST:  135mL OMNIPAQUE IOHEXOL 300 MG/ML SOLN, 11mL OMNIPAQUE IOHEXOL 300 MG/ML SOLN COMPARISON:  CT scan dated 08/21/2012 FINDINGS: Lower chest:  Normal. Hepatobiliary: There is a 2 cm subtle low-density area in the lateral segment of the left lobe of the liver adjacent to the falciform ligament. This becomes isodense on delayed imaging. It could represent an area of focal fatty infiltration. Liver is otherwise normal. Biliary tree is normal. Pancreas: Normal. Spleen: Normal. Adrenals/Urinary Tract: Normal. Stomach/Bowel: There subtle focal inflammation around a single diverticulum in the proximal sigmoid portion of the colon best seen on image 86 of series 4 and image 33 of series 3 with slight edema in the adjacent colonic mucosa. Scattered diverticula in the proximal sigmoid portion of the colon. Otherwise normal including the terminal ileum and appendix. Vascular/Lymphatic: Normal. Reproductive: Uterus and ovaries have been removed. Other: No free air or free fluid. Musculoskeletal: Normal. IMPRESSION: 1. Single inflamed colonic diverticulum in the proximal sigmoid portion of the colon with adjacent slight mucosal edema consistent with mild acute diverticulitis. 2. Probable focal area of subtle fatty infiltration in the lateral aspect of the left lobe of the liver. Electronically Signed   By: Lorriane Shire M.D.   On: 12/17/2015 14:03   I have personally reviewed and evaluated these images and lab results as part of my medical decision-making.   EKG  Interpretation None      MDM   Final diagnoses:  Diverticulitis of large intestine without perforation or abscess without bleeding    Normal vital signs. CT scan reveals a mild acute diverticulitis in the sigmoid colon. Intravenous pain medicine, IV Cipro, IV Flagyl. Discussed results with patient. We will try outpatient treatment including oral Cipro, oral Flagyl, pain medication. Patient agrees.    Nat Christen, MD 12/22/15 [phone]

## 2015-12-17 NOTE — Discharge Instructions (Signed)
Diverticulitis Diverticulitis is when small pockets that have formed in your colon (large intestine) become infected or swollen. HOME CARE  Follow your doctor's instructions.  Follow a special diet if told by your doctor.  When you feel better, your doctor may tell you to change your diet. You may be told to eat a lot of fiber. Fruits and vegetables are good sources of fiber. Fiber makes it easier to poop (have bowel movements).  Take supplements or probiotics as told by your doctor.  Only take medicines as told by your doctor.  Keep all follow-up visits with your doctor. GET HELP IF:  Your pain does not get better.  You have a hard time eating food.  You are not pooping like normal. GET HELP RIGHT AWAY IF:  Your pain gets worse.  Your problems do not get better.  Your problems suddenly get worse.  You have a fever.  You keep throwing up (vomiting).  You have bloody or black, tarry poop (stool). MAKE SURE YOU:   Understand these instructions.  Will watch your condition.  Will get help right away if you are not doing well or get worse.   This information is not intended to replace advice given to you by your health care provider. Make sure you discuss any questions you have with your health care provider.   Document Released: 05/24/2008 Document Revised: 12/11/2013 Document Reviewed: 10/31/2013 Elsevier Interactive Patient Education 2016 [place] have a small area of diverticulitis in your lower intestine. 2 prescriptions for antibiotics, medication for pain and nausea. Return if you feel you're getting worse.

## 2015-12-17 NOTE — ED Notes (Signed)
She c/o abd. Pain which began about a week ago and started as upper abd. Pain.  She is here today with c/o lower abd. Pain.  She was seen a week ago at a minor emerg[facility] and was rx with Cipro; anti nausea and pain med.  She reports ~ 10 sm. Liquid diarrhea stools per day x 1 week also.  She denies dysuria and is in no distress.

## 2015-12-17 NOTE — ED Notes (Signed)
Patient d/c'd self care.  F/U and medications discussed.  Patient and husband verbalized understanding.

## 2016-04-19 ENCOUNTER — Emergency Department (HOSPITAL_COMMUNITY): Payer: Managed Care, Other (non HMO)

## 2016-04-19 ENCOUNTER — Encounter (HOSPITAL_COMMUNITY): Payer: Self-pay

## 2016-04-19 ENCOUNTER — Emergency Department (HOSPITAL_COMMUNITY)
Admission: EM | Admit: 2016-04-19 | Discharge: 2016-04-19 | Disposition: A | Discharge status: Home / Self Care | Payer: Managed Care, Other (non HMO) | Attending: Emergency Medicine | Admitting: Emergency Medicine

## 2016-04-19 DIAGNOSIS — Z79899 Other long term (current) drug therapy: Secondary | ICD-10-CM | POA: Diagnosis not present

## 2016-04-19 DIAGNOSIS — I951 Orthostatic hypotension: Secondary | ICD-10-CM | POA: Diagnosis not present

## 2016-04-19 DIAGNOSIS — Z7982 Long term (current) use of aspirin: Secondary | ICD-10-CM | POA: Insufficient documentation

## 2016-04-19 DIAGNOSIS — R1032 Left lower quadrant pain: Secondary | ICD-10-CM | POA: Diagnosis present

## 2016-04-19 DIAGNOSIS — K5730 Diverticulosis of large intestine without perforation or abscess without bleeding: Secondary | ICD-10-CM | POA: Insufficient documentation

## 2016-04-19 DIAGNOSIS — J45909 Unspecified asthma, uncomplicated: Secondary | ICD-10-CM | POA: Diagnosis not present

## 2016-04-19 DIAGNOSIS — Z87891 Personal history of nicotine dependence: Secondary | ICD-10-CM | POA: Diagnosis not present

## 2016-04-19 DIAGNOSIS — Z90710 Acquired absence of both cervix and uterus: Secondary | ICD-10-CM | POA: Diagnosis not present

## 2016-04-19 DIAGNOSIS — Z3202 Encounter for pregnancy test, result negative: Secondary | ICD-10-CM | POA: Diagnosis not present

## 2016-04-19 HISTORY — DX: Unspecified asthma, uncomplicated: J45.909

## 2016-04-19 LAB — CBC
HEMATOCRIT: 37.8 % (ref 36.0–46.0)
Hemoglobin: 12.9 g/dL (ref 12.0–15.0)
MCH: 31 pg (ref 26.0–34.0)
MCHC: 34.1 g/dL (ref 30.0–36.0)
MCV: 90.9 fL (ref 78.0–100.0)
PLATELETS: 234 10*3/uL (ref 150–400)
RBC: 4.16 MIL/uL (ref 3.87–5.11)
RDW: 12.5 % (ref 11.5–15.5)
WBC: 5.6 10*3/uL (ref 4.0–10.5)

## 2016-04-19 LAB — URINALYSIS, ROUTINE W REFLEX MICROSCOPIC
BILIRUBIN URINE: NEGATIVE
GLUCOSE, UA: NEGATIVE mg/dL
HGB URINE DIPSTICK: NEGATIVE
KETONES UR: NEGATIVE mg/dL
Leukocytes, UA: NEGATIVE
Nitrite: NEGATIVE
PROTEIN: NEGATIVE mg/dL
Specific Gravity, Urine: 1.009 (ref 1.005–1.030)
pH: 7 (ref 5.0–8.0)

## 2016-04-19 LAB — BASIC METABOLIC PANEL
Anion gap: 9 (ref 5–15)
BUN: 13 mg/dL (ref 6–20)
CALCIUM: 9.5 mg/dL (ref 8.9–10.3)
CO2: 27 mmol/L (ref 22–32)
CREATININE: 0.91 mg/dL (ref 0.44–1.00)
Chloride: 104 mmol/L (ref 101–111)
GFR calc Af Amer: >60 mL/min (ref >60)
GLUCOSE: 103 mg/dL — AB (ref 65–99)
POTASSIUM: 4 mmol/L (ref 3.5–5.1)
SODIUM: 140 mmol/L (ref 135–145)

## 2016-04-19 LAB — D-DIMER, QUANTITATIVE: D-Dimer, Quant: <0.27 ug/mL-FEU (ref 0.00–0.50)

## 2016-04-19 LAB — POC URINE PREG, ED: PREG TEST UR: NEGATIVE

## 2016-04-19 LAB — I-STAT TROPONIN, ED: Troponin i, poc: 0 ng/mL (ref 0.00–0.08)

## 2016-04-19 MED ORDER — METRONIDAZOLE 500 MG PO TABS: 500.0000 mg | ORAL_TABLET | Freq: Three times a day (TID) | ORAL | Status: DC

## 2016-04-19 MED ORDER — DIATRIZOATE MEGLUMINE & SODIUM 66-10 % PO SOLN
30.0000 mL | Freq: Once | ORAL | Status: AC
  Administered 2016-04-19: 30 mL via ORAL

## 2016-04-19 MED ORDER — HYDROMORPHONE HCL 1 MG/ML IJ SOLN
1.0000 mg | Freq: Once | INTRAMUSCULAR | Status: AC
  Administered 2016-04-19: 1 mg via INTRAVENOUS
  Filled 2016-04-19: qty 1

## 2016-04-19 MED ORDER — OXYCODONE-ACETAMINOPHEN 5-325 MG PO TABS
1.0000 | ORAL_TABLET | Freq: Once | ORAL | Status: AC
  Administered 2016-04-19: 1 via ORAL
  Filled 2016-04-19: qty 1

## 2016-04-19 MED ORDER — SODIUM CHLORIDE 0.9 % IV BOLUS (SEPSIS)
1000.0000 mL | Freq: Once | INTRAVENOUS | Status: AC
  Administered 2016-04-19: 1000 mL via INTRAVENOUS

## 2016-04-19 MED ORDER — CIPROFLOXACIN HCL 500 MG PO TABS: 500.0000 mg | ORAL_TABLET | Freq: Two times a day (BID) | ORAL | Status: DC

## 2016-04-19 MED ORDER — IOPAMIDOL (ISOVUE-300) INJECTION 61%
100.0000 mL | Freq: Once | INTRAVENOUS | Status: AC | PRN
  Administered 2016-04-19: 100 mL via INTRAVENOUS

## 2016-04-19 MED ORDER — OXYCODONE-ACETAMINOPHEN 5-325 MG PO TABS: 1.0000 | ORAL_TABLET | ORAL | Status: DC | PRN

## 2016-04-19 MED ORDER — CIPROFLOXACIN HCL 500 MG PO TABS
500.0000 mg | ORAL_TABLET | Freq: Once | ORAL | Status: AC
  Administered 2016-04-19: 500 mg via ORAL
  Filled 2016-04-19: qty 1

## 2016-04-19 MED ORDER — ONDANSETRON HCL 4 MG PO TABS: 4.0000 mg | ORAL_TABLET | Freq: Four times a day (QID) | ORAL | Status: DC

## 2016-04-19 MED ORDER — METRONIDAZOLE 500 MG PO TABS
500.0000 mg | ORAL_TABLET | Freq: Once | ORAL | Status: AC
  Administered 2016-04-19: 500 mg via ORAL
  Filled 2016-04-19: qty 1

## 2016-04-19 NOTE — Discharge Instructions (Signed)
Hypotension As your heart beats, it forces blood through your body. This force is called blood pressure. If you have hypotension, you have low blood pressure. When your blood pressure is too low, you may not get enough blood to your brain. You may feel weak, feel lightheaded, have a fast heartbeat, or even pass out (faint). HOME CARE  Drink enough fluids to keep your pee (urine) clear or pale yellow.  Take all medicines as told by your doctor.  Get up slowly after sitting or lying down.  Wear support stockings as told by your doctor.  Maintain a healthy diet by including foods such as fruits, vegetables, nuts, whole grains, and lean meats. GET HELP IF:  You are throwing up (vomiting) or have watery poop (diarrhea).  You have a fever for more than 2-3 days.  You feel more thirsty than usual.  You feel weak and tired. GET HELP RIGHT AWAY IF:   You pass out (faint).  You have chest pain or a fast or irregular heartbeat.  You lose feeling in part of your body.  You cannot move your arms or legs.  You have trouble speaking.  You get sweaty or feel lightheaded. MAKE SURE YOU:   Understand these instructions.  Will watch your condition.  Will get help right away if you are not doing well or get worse.   This information is not intended to replace advice given to you by your health care provider. Make sure you discuss any questions you have with your health care provider.   Document Released: 03/02/2010 Document Revised: 08/08/2013 Document Reviewed: 06/08/2013 Elsevier Interactive Patient Education 2016 [employer]vier Inc[employer]  

## 2016-04-19 NOTE — ED Provider Notes (Signed)
  Patient care signed out at shift change by previous provider pending CT results.  Patient seen for syncope, likely orthostatic related. Patient improved after IV fluids. CT scan shows uncomplicated diverticulitis. Patient is tolerating by mouth without difficulty. Patient will be given antibiotics, pain medication by mouth and discharged home with instructions to follow up with primary care. Patient is afebrile, nontoxic in no acute distress. She is stable for discharge home, she is given strict return precautions. She verbalized her understanding and agreement to today's plan and had no further questions or concerns at time discharge.  Filed Vitals:   04/19/16 2100 04/19/16 2130  BP: 123/68 109/70  Pulse: 71 71  Temp:    Resp: 17 [id number], PA-C 04/19/16 2215  Wandra Arthurs, MD 04/19/16 2253

## 2016-04-19 NOTE — ED Provider Notes (Signed)
CSN: [id number]     Arrival date & time 04/19/16  1507 History   First MD Initiated Contact with Patient 04/19/16 1733     Chief Complaint  Patient presents with  . Loss of Consciousness  . Chest Pain  . Abdominal Pain   (Consider location/radiation/quality/duration/timing/severity/associated sxs/prior Treatment) HPI 50 y.o. female with a hx of Diverticulitis, presents to the Emergency Department today complaining of loss of consciousness as well as abdominal pain.  1) Pt states that she has been losing consciousness intermittently for the past 2 weeks. States that the episodes usually occur when she rises from a seated or lying down position. Notes palpitations, lightheadedness prior to syncopal episodes after standing. Chest pain every once and a while. Husband states that she loses consciousness for <1 min. No seizures. No Cardiac hx. No SOB. No headache. No vision changes. No other symptoms noted.  2) Pt notes abdominal pain has been occuring for the past week. Located LLQ. States pain is 7/10. No fevers. No diarrhea. No blood in stool. Hx of the same in December and treated with ABX outpatient. Notes symptoms feel worse. No other symptoms noted.      Past Medical History  Diagnosis Date  . Diverticulitis of colon   . Asthma    Past Surgical History  Procedure Laterality Date  . Abdominal hysterectomy      preformed at [facility] by Dr Jacqualyn Posey in April  . Foot surgery     Family History  Problem Relation Age of Onset  . Heart failure Father    Social History  Substance Use Topics  . Smoking status: Former Smoker    Types: Cigarettes  . Smokeless tobacco: Never Used  . Alcohol Use: No   OB History    No data available     Review of Systems ROS reviewed and all are negative for acute change except as noted in the HPI.  Allergies  Buprenorphine hcl; Codeine; Morphine and related; and Propranolol  Home Medications   Prior to Admission medications   Medication Sig  Start Date End Date Taking? Authorizing Provider  ALPRAZolam (XANAX XR) 1 MG 24 hr tablet Take 1 mg by mouth daily. 02/10/16  Yes Historical Provider, MD  aspirin 325 MG tablet Take 325 mg by mouth 3 (three) times daily.   Yes Historical Provider, MD  bismuth subsalicylate (PEPTO BISMOL) 262 MG/15ML suspension Take 30 mLs by mouth every 6 (six) hours as needed for indigestion or diarrhea or loose stools.   Yes Historical Provider, MD  dexmethylphenidate (FOCALIN XR) 20 MG 24 hr capsule Take 20 mg by mouth 2 (two) times daily.   Yes Historical Provider, MD  dexmethylphenidate (FOCALIN) 10 MG tablet Take 10-20 mg by mouth daily as needed (in between focalin XR 20mg  if needed for ADHD).    Yes Historical Provider, MD  dimenhyDRINATE (DRAMAMINE) 50 MG tablet Take 150 mg by mouth every 8 (eight) hours as needed for dizziness.   Yes Historical Provider, MD  risperiDONE (RISPERDAL) 3 MG tablet Take 9 mg by mouth at bedtime.   Yes Historical Provider, MD  traZODone (DESYREL) 150 MG tablet Take 150 mg by mouth at bedtime. 11/04/15  Yes Historical Provider, MD  ciprofloxacin (CIPRO) 500 MG tablet Take 1 tablet (500 mg total) by mouth 2 (two) times daily. Patient not taking: Reported on 04/19/2016 12/17/15   Nat Christen, MD  metroNIDAZOLE (FLAGYL) 500 MG tablet Take 1 tablet (500 mg total) by mouth 4 (four) times daily. Patient not  taking: Reported on 04/19/2016 12/17/15   Nat Christen, MD  ondansetron (ZOFRAN) 4 MG tablet Take 1 tablet (4 mg total) by mouth every 6 (six) hours. Patient not taking: Reported on 04/19/2016 12/17/15   Nat Christen, MD  oxyCODONE-acetaminophen (PERCOCET) 5-325 MG tablet Take 2 tablets by mouth every 4 (four) hours as needed. Patient not taking: Reported on 04/19/2016 12/17/15   Nat Christen, MD   BP 110/80 mmHg  Pulse 90  Temp(Src) 98.3 F (36.8 C) (Oral)  Resp 16  SpO2 96%   Physical Exam  Constitutional: She is oriented to person, place, and time. She appears well-developed and  well-nourished.  HENT:  Head: Normocephalic and atraumatic.  Eyes: EOM are normal. Pupils are equal, round, and reactive to light.  Neck: Normal range of motion. Neck supple. No tracheal deviation present.  Cardiovascular: Normal rate, regular rhythm, normal heart sounds and intact distal pulses.   No murmur heard. Pulmonary/Chest: Effort normal and breath sounds normal. No respiratory distress. She has no wheezes. She has no rales. She exhibits no tenderness.  Abdominal: Soft. Normal appearance and bowel sounds are normal. There is tenderness in the left lower quadrant. There is no rigidity, no rebound, no tenderness at McBurney's point and negative Murphy's sign.  Musculoskeletal: Normal range of motion.  Neurological: She is alert and oriented to person, place, and time. She has normal strength. No cranial nerve deficit or sensory deficit.  Cranial Nerves:  II: Pupils equal, round, reactive to light III,IV, VI: ptosis not present, extra-ocular motions intact bilaterally  V,VII: smile symmetric, facial light touch sensation equal VIII: hearing grossly normal bilaterally  IX,X: midline uvula rise  XI: bilateral shoulder shrug equal and strong XII: midline tongue extension  Skin: Skin is warm and dry.  Psychiatric: She has a normal mood and affect. Her behavior is normal. Thought content normal.  Nursing note and vitals reviewed.  ED Course  Procedures (including critical care time) Labs Review Labs Reviewed  BASIC METABOLIC PANEL - Abnormal; Notable for the following:    Glucose, Bld 103 (*)    All other components within normal limits  CBC  URINALYSIS, ROUTINE W REFLEX MICROSCOPIC (NOT AT [facility])  D-DIMER, QUANTITATIVE (NOT AT [facility])  Randolm Idol, ED  POC URINE PREG, ED   Imaging Review Dg Chest 2 View  04/19/2016  CLINICAL DATA:  Left-sided chest pain and tachycardia along with night sweats for 2 weeks. EXAM: CHEST  2 VIEW COMPARISON:  Tonight 1,010 FINDINGS: The cardiac  silhouette, mediastinal and hilar contours are normal. The lungs are clear. No pleural effusion. The bony thorax is intact. IMPRESSION: No acute cardiopulmonary findings.  Is Electronically Signed   By: Marijo Sanes M.D.   On: 04/19/2016 16:24   I have personally reviewed and evaluated these images and lab results as part of my medical decision-making.   EKG Interpretation   Date/Time:  Monday Apr 19 2016 15:55:35 EDT Ventricular Rate:  104 PR Interval:  176 QRS Duration: 94 QT Interval:  328 QTC Calculation: 431 R Axis:   54 Text Interpretation:  Sinus tachycardia Low voltage, precordial leads  Since last tracing rate faster Confirmed by YAO  MD, DAVID ([id number]) on  04/19/2016 5:43:20 PM      MDM  I have reviewed and evaluated the relevant laboratory values. I have reviewed and evaluated the relevant imaging studies.  I have interpreted the relevant EKG. I have reviewed the relevant previous healthcare records.  I obtained HPI from historian. Patient discussed with  supervising physician  ED Course:  Assessment: Pt is a 50yF with hx Diverticulitis who presents with LOC x 2 weeks and ABD pain x 1 week. LOC most likely Orthostatic in origin based on vitals today. On exam, pt in NAD. Nontoxic/nonseptic appearing. VSS. Positive Orthostatics. Afebrile. Lungs CTA. Heart RRR. Abdomen TTP LLQ. Labs with no Leukocytosis. D Dimer ordered due to frequent LOCs to eval PE, which was negative. EKG unremarkable. CT Abd/Pelvis pending. Given fluids in ED with improvement of vitals. Plan is to DC home with follow up to PCP for further management. Questionable underlying medication involvement with chronic use of Xanax, Trazodone and Ambien. At time of discharge, Patient is in no acute distress. Vital Signs are stable. Patient is able to ambulate. Patient able to tolerate PO.    Disposition/Plan:  8:50 PM Sign Out: Lenn Sink, PA-C Dispo pending results of CT Abd/Pelvis for possible Diverticulitis   Additional Verbal discharge instructions given and discussed with patient.  Pt Instructed to f/u with PCP in the next week for evaluation and treatment of symptoms. Return precautions given Pt acknowledges and agrees with plan  Supervising Physician Wandra Arthurs, MD   Final diagnoses:  None     Shary Decamp, PA-C 04/19/16 2053  Wandra Arthurs, MD 04/19/16 [phone]

## 2016-04-19 NOTE — ED Notes (Signed)
Pt cannot use restroom at this time, aware urine specimen is needed.  

## 2016-04-19 NOTE — ED Notes (Signed)
Patient transported to CT 

## 2016-04-19 NOTE — ED Notes (Addendum)
Patient states she has been having intermittent chest pain and feeling that her heart is racing. Patient states chest pain and SOB at this time, but alsofeels  her heart racing. Patient also reports LOC x 8 in the past week.  Patient also c/o abdominal pain and has a history of diverticulitis.

## 2016-04-26 ENCOUNTER — Inpatient Hospital Stay (HOSPITAL_BASED_OUTPATIENT_CLINIC_OR_DEPARTMENT_OTHER)
Admission: EM | Admit: 2016-04-26 | Discharge: 2016-04-30 | DRG: 392 | Disposition: A | Discharge status: Home / Self Care | Payer: Managed Care, Other (non HMO) | Attending: Internal Medicine | Admitting: Internal Medicine

## 2016-04-26 ENCOUNTER — Emergency Department (HOSPITAL_BASED_OUTPATIENT_CLINIC_OR_DEPARTMENT_OTHER): Payer: Managed Care, Other (non HMO)

## 2016-04-26 ENCOUNTER — Encounter (HOSPITAL_BASED_OUTPATIENT_CLINIC_OR_DEPARTMENT_OTHER): Payer: Self-pay | Admitting: *Deleted

## 2016-04-26 DIAGNOSIS — Z79899 Other long term (current) drug therapy: Secondary | ICD-10-CM | POA: Diagnosis not present

## 2016-04-26 DIAGNOSIS — J45909 Unspecified asthma, uncomplicated: Secondary | ICD-10-CM | POA: Diagnosis present

## 2016-04-26 DIAGNOSIS — K219 Gastro-esophageal reflux disease without esophagitis: Secondary | ICD-10-CM | POA: Diagnosis present

## 2016-04-26 DIAGNOSIS — K5900 Constipation, unspecified: Secondary | ICD-10-CM | POA: Diagnosis present

## 2016-04-26 DIAGNOSIS — M1990 Unspecified osteoarthritis, unspecified site: Secondary | ICD-10-CM | POA: Diagnosis present

## 2016-04-26 DIAGNOSIS — G8929 Other chronic pain: Secondary | ICD-10-CM | POA: Diagnosis present

## 2016-04-26 DIAGNOSIS — E870 Hyperosmolality and hypernatremia: Secondary | ICD-10-CM | POA: Diagnosis present

## 2016-04-26 DIAGNOSIS — R109 Unspecified abdominal pain: Secondary | ICD-10-CM | POA: Diagnosis not present

## 2016-04-26 DIAGNOSIS — K5720 Diverticulitis of large intestine with perforation and abscess without bleeding: Secondary | ICD-10-CM | POA: Diagnosis present

## 2016-04-26 DIAGNOSIS — M549 Dorsalgia, unspecified: Secondary | ICD-10-CM | POA: Diagnosis present

## 2016-04-26 DIAGNOSIS — Z881 Allergy status to other antibiotic agents status: Secondary | ICD-10-CM | POA: Diagnosis not present

## 2016-04-26 DIAGNOSIS — G4700 Insomnia, unspecified: Secondary | ICD-10-CM | POA: Diagnosis present

## 2016-04-26 DIAGNOSIS — F4310 Post-traumatic stress disorder, unspecified: Secondary | ICD-10-CM | POA: Diagnosis present

## 2016-04-26 DIAGNOSIS — K5792 Diverticulitis of intestine, part unspecified, without perforation or abscess without bleeding: Secondary | ICD-10-CM

## 2016-04-26 DIAGNOSIS — Z7982 Long term (current) use of aspirin: Secondary | ICD-10-CM | POA: Diagnosis not present

## 2016-04-26 DIAGNOSIS — Z8711 Personal history of peptic ulcer disease: Secondary | ICD-10-CM | POA: Diagnosis not present

## 2016-04-26 HISTORY — DX: Headache, unspecified: R51.9

## 2016-04-26 HISTORY — DX: Headache: R51

## 2016-04-26 HISTORY — DX: Gastro-esophageal reflux disease without esophagitis: K21.9

## 2016-04-26 HISTORY — DX: Anxiety disorder, unspecified: F41.9

## 2016-04-26 HISTORY — DX: Angina pectoris, unspecified: I20.9

## 2016-04-26 HISTORY — DX: Low back pain: M54.5

## 2016-04-26 HISTORY — DX: Gastric ulcer, unspecified as acute or chronic, without hemorrhage or perforation: K25.9

## 2016-04-26 HISTORY — DX: Anemia, unspecified: D64.9

## 2016-04-26 HISTORY — DX: Low back pain, unspecified: M54.50

## 2016-04-26 HISTORY — DX: Migraine, unspecified, not intractable, without status migrainosus: G43.909

## 2016-04-26 HISTORY — DX: Other chronic pain: G89.29

## 2016-04-26 HISTORY — DX: Unspecified osteoarthritis, unspecified site: M19.90

## 2016-04-26 LAB — COMPREHENSIVE METABOLIC PANEL
ALBUMIN: 3.9 g/dL (ref 3.5–5.0)
ALK PHOS: 47 U/L (ref 38–126)
ALT: 20 U/L (ref 14–54)
ANION GAP: 4 — AB (ref 5–15)
AST: 19 U/L (ref 15–41)
BUN: 7 mg/dL (ref 6–20)
CALCIUM: 8.7 mg/dL — AB (ref 8.9–10.3)
CO2: 27 mmol/L (ref 22–32)
Chloride: 105 mmol/L (ref 101–111)
Creatinine, Ser: 0.98 mg/dL (ref 0.44–1.00)
GFR calc Af Amer: >60 mL/min (ref >60)
GLUCOSE: 97 mg/dL (ref 65–99)
Potassium: 3.7 mmol/L (ref 3.5–5.1)
Sodium: 136 mmol/L (ref 135–145)
TOTAL PROTEIN: 6.9 g/dL (ref 6.5–8.1)
Total Bilirubin: 0.3 mg/dL (ref 0.3–1.2)

## 2016-04-26 LAB — URINALYSIS, ROUTINE W REFLEX MICROSCOPIC
BILIRUBIN URINE: NEGATIVE
Glucose, UA: NEGATIVE mg/dL
Hgb urine dipstick: NEGATIVE
KETONES UR: NEGATIVE mg/dL
Leukocytes, UA: NEGATIVE
NITRITE: NEGATIVE
Protein, ur: NEGATIVE mg/dL
Specific Gravity, Urine: 1.005 (ref 1.005–1.030)
pH: 7.5 (ref 5.0–8.0)

## 2016-04-26 LAB — CBC
HCT: 36.6 % (ref 36.0–46.0)
HEMOGLOBIN: 12.6 g/dL (ref 12.0–15.0)
MCH: 31.4 pg (ref 26.0–34.0)
MCHC: 34.4 g/dL (ref 30.0–36.0)
MCV: 91.3 fL (ref 78.0–100.0)
Platelets: 241 10*3/uL (ref 150–400)
RBC: 4.01 MIL/uL (ref 3.87–5.11)
RDW: 12 % (ref 11.5–15.5)
WBC: 6.4 10*3/uL (ref 4.0–10.5)

## 2016-04-26 LAB — LIPASE, BLOOD: Lipase: 24 U/L (ref 11–51)

## 2016-04-26 MED ORDER — ONDANSETRON HCL 4 MG/2ML IJ SOLN
4.0000 mg | Freq: Once | INTRAMUSCULAR | Status: AC
  Administered 2016-04-26: 4 mg via INTRAVENOUS
  Filled 2016-04-26: qty 2

## 2016-04-26 MED ORDER — METRONIDAZOLE IN NACL 5-0.79 MG/ML-% IV SOLN
500.0000 mg | Freq: Once | INTRAVENOUS | Status: AC
  Administered 2016-04-26: 500 mg via INTRAVENOUS
  Filled 2016-04-26: qty 100

## 2016-04-26 MED ORDER — IOPAMIDOL (ISOVUE-300) INJECTION 61%
100.0000 mL | Freq: Once | INTRAVENOUS | Status: AC | PRN
  Administered 2016-04-26: 100 mL via INTRAVENOUS

## 2016-04-26 MED ORDER — RISPERIDONE 3 MG PO TABS
9.0000 mg | ORAL_TABLET | Freq: Every day | ORAL | Status: DC
  Administered 2016-04-26 – 2016-04-29 (×4): 9 mg via ORAL
  Filled 2016-04-26 (×4): qty 3

## 2016-04-26 MED ORDER — ALPRAZOLAM ER 1 MG PO TB24: 1.0000 mg | ORAL_TABLET | Freq: Every evening | ORAL | Status: DC | PRN

## 2016-04-26 MED ORDER — HYDROMORPHONE HCL 1 MG/ML IJ SOLN
1.0000 mg | Freq: Once | INTRAMUSCULAR | Status: AC
  Administered 2016-04-26: 1 mg via INTRAVENOUS
  Filled 2016-04-26: qty 1

## 2016-04-26 MED ORDER — METRONIDAZOLE IN NACL 5-0.79 MG/ML-% IV SOLN
500.0000 mg | Freq: Three times a day (TID) | INTRAVENOUS | Status: DC
  Administered 2016-04-27 (×2): 500 mg via INTRAVENOUS
  Filled 2016-04-26 (×4): qty 100

## 2016-04-26 MED ORDER — TRAZODONE HCL 150 MG PO TABS
300.0000 mg | ORAL_TABLET | Freq: Every day | ORAL | Status: DC
  Administered 2016-04-26 – 2016-04-29 (×4): 300 mg via ORAL
  Filled 2016-04-26 (×4): qty 2

## 2016-04-26 MED ORDER — DIPHENHYDRAMINE HCL 50 MG/ML IJ SOLN
25.0000 mg | Freq: Once | INTRAMUSCULAR | Status: AC
Start: 2016-04-26 — End: 2016-04-26
  Administered 2016-04-26: 25 mg via INTRAVENOUS
  Filled 2016-04-26: qty 1

## 2016-04-26 MED ORDER — CIPROFLOXACIN IN D5W 400 MG/200ML IV SOLN
400.0000 mg | Freq: Once | INTRAVENOUS | Status: AC
  Administered 2016-04-26: 400 mg via INTRAVENOUS
  Filled 2016-04-26: qty 200

## 2016-04-26 MED ORDER — ALPRAZOLAM 0.5 MG PO TABS
1.0000 mg | ORAL_TABLET | Freq: Every evening | ORAL | Status: DC | PRN
  Administered 2016-04-29: 1 mg via ORAL
  Filled 2016-04-26: qty 2

## 2016-04-26 MED ORDER — SODIUM CHLORIDE 0.9 % IV BOLUS (SEPSIS)
1000.0000 mL | Freq: Once | INTRAVENOUS | Status: AC
  Administered 2016-04-26: 1000 mL via INTRAVENOUS

## 2016-04-26 MED ORDER — ZOLPIDEM TARTRATE 5 MG PO TABS
5.0000 mg | ORAL_TABLET | Freq: Every evening | ORAL | Status: DC | PRN
  Administered 2016-04-26 – 2016-04-29 (×4): 5 mg via ORAL
  Filled 2016-04-26 (×4): qty 1

## 2016-04-26 MED ORDER — ACETAMINOPHEN 650 MG RE SUPP: 650.0000 mg | Freq: Four times a day (QID) | RECTAL | Status: DC | PRN

## 2016-04-26 MED ORDER — MORPHINE SULFATE (PF) 4 MG/ML IV SOLN
4.0000 mg | INTRAVENOUS | Status: DC | PRN
  Administered 2016-04-26 – 2016-04-27 (×4): 4 mg via INTRAVENOUS
  Filled 2016-04-26 (×4): qty 1

## 2016-04-26 MED ORDER — OXYCODONE HCL 5 MG PO TABS
5.0000 mg | ORAL_TABLET | ORAL | Status: DC | PRN
  Administered 2016-04-27 – 2016-04-28 (×3): 5 mg via ORAL
  Filled 2016-04-26 (×5): qty 1

## 2016-04-26 MED ORDER — ACETAMINOPHEN 325 MG PO TABS: 650.0000 mg | ORAL_TABLET | Freq: Four times a day (QID) | ORAL | Status: DC | PRN

## 2016-04-26 MED ORDER — METRONIDAZOLE IN NACL 5-0.79 MG/ML-% IV SOLN
500.0000 mg | Freq: Three times a day (TID) | INTRAVENOUS | Status: DC
  Filled 2016-04-26 (×2): qty 100

## 2016-04-26 MED ORDER — DEXTROSE 5 % IV SOLN
2.0000 g | INTRAVENOUS | Status: DC
  Administered 2016-04-27: 2 g via INTRAVENOUS
  Filled 2016-04-26 (×2): qty 2

## 2016-04-26 MED ORDER — SODIUM CHLORIDE 0.9 % IV SOLN
INTRAVENOUS | Status: AC
  Administered 2016-04-26: 23:00:00 via INTRAVENOUS

## 2016-04-26 MED ORDER — ONDANSETRON HCL 4 MG PO TABS: 4.0000 mg | ORAL_TABLET | Freq: Four times a day (QID) | ORAL | Status: DC | PRN

## 2016-04-26 MED ORDER — ONDANSETRON HCL 4 MG/2ML IJ SOLN
4.0000 mg | Freq: Four times a day (QID) | INTRAMUSCULAR | Status: DC | PRN
  Administered 2016-04-28 – 2016-04-29 (×3): 4 mg via INTRAVENOUS
  Filled 2016-04-26 (×3): qty 2

## 2016-04-26 NOTE — ED Notes (Addendum)
While this rn is pushing her iv medications, pt states "why are you doing it so slow? Isn't it going in?" explained to pt that her iv is running fine, I am pushing medications at the recommended rate, pt states "well I usually feel it more..." pt then lays her head back and states "OK, I feel it now..."

## 2016-04-26 NOTE — ED Notes (Signed)
Dr. Stark Jock wanted a restart of cipro, pt is stating that her throat feels hoarse.  Pt was hoarse prior to medication administration upon nurse first meeting pt.  No hives noted, lungs are clear, oral airway is not swollen.

## 2016-04-26 NOTE — ED Notes (Signed)
Phoned receiving nurse about issue with Cipro.  Not listed as an allergy yet because nothing was found objectively by nurse to suggest an allergic reaction and pt has had cipro in the past without an issue.

## 2016-04-26 NOTE — Progress Notes (Signed)
Pharmacy Antibiotic Note  Deborah Pena is a 50 y.o. female admitted on 04/26/2016 with diverticulitis w/ developing abscess on CT.  Pharmacy has been consulted for Rocephin dosing 2/2 itching and hoarseness w/ Cipro administration (has never had problem with FQs before).  Plan: Rocephin 2g IV every 24 hours.  Height: 5\' 3"  (160 cm) Weight: 207 lb 1.6 oz (93.94 kg) IBW/kg (Calculated) : 52.4  Temp (24hrs), Avg:98.3 F (36.8 C), Min:98 F (36.7 C), Max:98.5 F (36.9 C)   Recent Labs Lab 04/26/16 1514  WBC 6.4  CREATININE 0.98    Estimated Creatinine Clearance: 76.5 mL/min (by C-G formula based on Cr of 0.98).    Allergies  Allergen Reactions  . Buprenorphine Hcl Other (See Comments)    "Becomes psycotic." --per notes from another healthcare network.   . Ciprofloxacin Itching    Sore throat  . Codeine Itching    Itches from inside out  . Morphine And Related Other (See Comments)    Becomes phsycotic  . Propranolol     Syncope.      Thank you for allowing pharmacy to be a part of this patient's care.  Wynona Neat, PharmD, BCPS  04/26/2016 11:06 PM

## 2016-04-26 NOTE — ED Provider Notes (Signed)
CSN: [id number]     Arrival date & time 04/26/16  1451 History  By signing my name below, I, Doran Stabler, attest that this documentation has been prepared under the direction and in the presence of Veryl Speak, MD. Electronically Signed: Doran Stabler, ED Scribe. 04/26/2016. 3:34 PM.   Chief Complaint  Patient presents with  . Abdominal Pain   The history is provided by the patient. No language interpreter was used.   HPI Comments: Deborah Pena is a 50 y.o. female who presents to the Emergency Department with a PMHx of diverticulitis complaining of worsening abdominal pain that has been worsening for the past 1 week. Pt also reports constipation, and fever. Pt states she was seen at [facility] ED 1 week ago. There, she was dx with diverticulitis and placed on abx . She states her pain has been worsening since. She has not been eating or drinking due to pain.  Pt denies bloody stools, vomiting, or any other symptoms at this time.    Pts last BM 12 days ago.  Past Medical History  Diagnosis Date  . Diverticulitis of colon   . Asthma    Past Surgical History  Procedure Laterality Date  . Abdominal hysterectomy      preformed at [facility] by Dr Jacqualyn Posey in April  . Foot surgery     Family History  Problem Relation Age of Onset  . Heart failure Father    Social History  Substance Use Topics  . Smoking status: Former Smoker    Types: Cigarettes  . Smokeless tobacco: Never Used  . Alcohol Use: No   OB History    No data available     Review of Systems A complete 10 system review of systems was obtained and all systems are negative except as noted in the HPI and PMH.   Allergies  Buprenorphine hcl; Codeine; Morphine and related; and Propranolol  Home Medications   Prior to Admission medications   Medication Sig Start Date End Date Taking? Authorizing Provider  ALPRAZolam (XANAX XR) 1 MG 24 hr tablet Take 1 mg by mouth daily. 02/10/16   Historical Provider, MD  aspirin  325 MG tablet Take 325 mg by mouth 3 (three) times daily.    Historical Provider, MD  bismuth subsalicylate (PEPTO BISMOL) 262 MG/15ML suspension Take 30 mLs by mouth every 6 (six) hours as needed for indigestion or diarrhea or loose stools.    Historical Provider, MD  ciprofloxacin (CIPRO) 500 MG tablet Take 1 tablet (500 mg total) by mouth every 12 (twelve) hours. 04/19/16   Okey Regal, PA-C  dexmethylphenidate (FOCALIN XR) 20 MG 24 hr capsule Take 20 mg by mouth 2 (two) times daily.    Historical Provider, MD  dexmethylphenidate (FOCALIN) 10 MG tablet Take 10-20 mg by mouth daily as needed (in between focalin XR 20mg  if needed for ADHD).     Historical Provider, MD  dimenhyDRINATE (DRAMAMINE) 50 MG tablet Take 150 mg by mouth every 8 (eight) hours as needed for dizziness.    Historical Provider, MD  metroNIDAZOLE (FLAGYL) 500 MG tablet Take 1 tablet (500 mg total) by mouth 3 (three) times daily. 04/19/16   Dellis Filbert Hedges, PA-C  ondansetron (ZOFRAN) 4 MG tablet Take 1 tablet (4 mg total) by mouth every 6 (six) hours. 04/19/16   Okey Regal, PA-C  oxyCODONE-acetaminophen (ROXICET) 5-325 MG tablet Take 1 tablet by mouth every 4 (four) hours as needed for severe pain. 04/19/16   Okey Regal, PA-C  risperiDONE (  RISPERDAL) 3 MG tablet Take 9 mg by mouth at bedtime.    Historical Provider, MD  traZODone (DESYREL) 150 MG tablet Take 150 mg by mouth at bedtime. 11/04/15   Historical Provider, MD   BP 112/78 mmHg  Pulse 99  Temp(Src) 98.4 F (36.9 C) (Oral)  Resp 20  Ht 5\' 3"  (1.6 m)  Wt 210 lb (95.255 kg)  BMI 37.21 kg/m2  SpO2 100%   Physical Exam  Constitutional: She is oriented to person, place, and time. She appears well-developed and well-nourished. No distress.  HENT:  Head: Normocephalic and atraumatic.  Mouth/Throat: Oropharynx is clear and moist. No oropharyngeal exudate.  Eyes: Conjunctivae and EOM are normal. Pupils are equal, round, and reactive to light.  Neck: Normal range of  motion. Neck supple.  No meningismus.  Cardiovascular: Normal rate, regular rhythm, normal heart sounds and intact distal pulses.   No murmur heard. Pulmonary/Chest: Effort normal and breath sounds normal. No respiratory distress.  Abdominal: Soft. Bowel sounds are normal. She exhibits no distension. There is tenderness. There is no rebound and no guarding.  TTP across the lower abdomen, left greater than right  Musculoskeletal: Normal range of motion. She exhibits no edema or tenderness.  Neurological: She is alert and oriented to person, place, and time. No cranial nerve deficit. She exhibits normal muscle tone. Coordination normal.  No ataxia on finger to nose bilaterally. No pronator drift. 5/5 strength throughout. CN 2-12 intact.Equal grip strength. Sensation intact.   Skin: Skin is warm.  Psychiatric: She has a normal mood and affect. Her behavior is normal.  Nursing note and vitals reviewed.  ED Course  Procedures  DIAGNOSTIC STUDIES: Oxygen Saturation is 100% on room air, normal by my interpretation.    COORDINATION OF CARE: 3:28 PM Will give fluids, antiemetic, and pain medication. Will order blood work and urinalysis. Discussed treatment plan with pt at bedside and pt agreed to plan.  Labs Review Labs Reviewed  LIPASE, BLOOD  COMPREHENSIVE METABOLIC PANEL  CBC  URINALYSIS, ROUTINE W REFLEX MICROSCOPIC (NOT AT [facility])   Imaging Review No results found. I have personally reviewed and evaluated these images and lab results as part of my medical decision-making.    MDM   Final diagnoses:  None    Patient with persistent abdominal pain despite Cipro and Flagyl for diverticulitis. Her CT scan today reveals persistent diverticulitis with possible forming diverticular abscess. She appears to be failing outpatient therapy and will be admitted to the hospitalist service under the care of Dr. Loleta Books.  I personally performed the services described in this documentation, which  was scribed in my presence. The recorded information has been reviewed and is accurate.      Veryl Speak, MD 04/26/16 [phone]

## 2016-04-26 NOTE — ED Notes (Addendum)
Radiology tech notifies this rn that pt requests to see me, upon entry to pt room pt is texting on her cell phone, talking with her sig other in nad. Pt states that her pain remains 10/10, and that she has not seen me in "over 4 hours!" this rn reminds pt that I saw her just before she left for radiology, and that she has been in the ER for less than 4 hours. Dr. Stark Jock notified of pt request for additional pain and nausea medications.

## 2016-04-26 NOTE — H&P (Signed)
History and Physical  Patient Name: Deborah Pena     [id number]    DOB: September 10, 1966    DOA: 04/26/2016 Referring provider: [name], MD PCP: Sandi Mariscal, MD  Outpatient specialists:  Psychiatry      Patient coming from: Home  Chief Complaint: Abdominal pain  HPI: Deborah Pena is a 50 y.o. female with a past medical history significant for PTSD who presents with worsening abdominal pain despite treatement for diverticulitis.  The patient was in her normal state of health until about 10 days ago when she developed crampy left lower quadrant pain similar to her previous diverticulitis, had a syncopal episode, and was evaluated by her PCP. This provider sent her to the ER, where she was orthostatic, had uncomplicated diverticulitis by CT scan, was hemodynamically stable, and was discharged home with oral Cipro, Flagyl, and Percocet.  Since then, she describes worsening intermittent left lower quadrant pain, worse with eating, subjective fevers, no bowel movement in 12 days, and nausea without vomiting, despite taking antibiotics as prescribed.  Today, she was unable to eat again and felt weak, so she came to the ER.  In the ED, she was afebrile, tachycardic, and had normal blood pressure.  Na 136, K 3.7, Cr 1.0, WBC 6.4K, Hgb 12.  Lipase normal.  UA normal.  CT abdomen and pelvis with contrast was repeated and showed possible beginning abscess formation.  [facility] were asked to accept in transfer from [facility] for diverticulitis with abscess failed outpatient therapy.  Of note, she developed itching all over without hives as well as tightness in the throat shortly after starting IV ciprofloxacin tonight.  This medication was discontinued at [facility] and added to her allergy list.     Review of Systems:  Pt complains of fever, nausea, wheezing, constipation, left lower quadrant pain, anorexia. All other systems negative except as just noted or noted in the history of present illness.    Past Medical  History  Diagnosis Date  . Diverticulitis of colon   . Asthma     Past Surgical History  Procedure Laterality Date  . Abdominal hysterectomy      preformed at [facility] by Dr Jacqualyn Posey in April  . Foot surgery      Social History: Patient lives With her husband and daughter. 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 She is from [place] originally. She does not smoke. Does not use alcohol.    Allergies  Allergen Reactions  . Buprenorphine Hcl Other (See Comments)    "Becomes psycotic." --per notes from another healthcare network.   . Ciprofloxacin Itching    Sore throat  . Codeine Itching    Itches from inside out  . Morphine And Related Other (See Comments)    Becomes phsycotic  . Propranolol     Syncope.     Family history: family history includes Breast cancer in her other; Diabetes in her other; Diverticulitis in her other; Heart failure in her father.  Prior to Admission medications   Medication Sig Start Date End Date Taking? Authorizing Provider  ALPRAZolam (XANAX XR) 1 MG 24 hr tablet Take 1 mg by mouth daily. 02/10/16   Historical Provider, MD  aspirin 325 MG tablet Take 325 mg by mouth 3 (three) times daily.    Historical Provider, MD  bismuth subsalicylate (PEPTO BISMOL) 262 MG/15ML suspension Take 30 mLs by mouth every 6 (six) hours as needed for indigestion or diarrhea or loose stools.    Historical Provider, MD  dexmethylphenidate (FOCALIN XR) 20 MG 24  hr capsule Take 20 mg by mouth 2 (two) times daily.    Historical Provider, MD  dexmethylphenidate (FOCALIN) 10 MG tablet Take 10-20 mg by mouth daily as needed (in between focalin XR 20mg  if needed for ADHD).     Historical Provider, MD  dimenhyDRINATE (DRAMAMINE) 50 MG tablet Take 150 mg by mouth every 8 (eight) hours as needed for dizziness.    Historical Provider, MD  ondansetron (ZOFRAN) 4 MG tablet Take 1 tablet (4 mg total) by mouth every 6 (six) hours. 04/19/16   Okey Regal, PA-C  oxyCODONE-acetaminophen (ROXICET) 5-325  MG tablet Take 1 tablet by mouth every 4 (four) hours as needed for severe pain. 04/19/16   Okey Regal, PA-C  risperiDONE (RISPERDAL) 3 MG tablet Take 9 mg by mouth at bedtime.    Historical Provider, MD  traZODone (DESYREL) 150 MG tablet Take 150 mg by mouth at bedtime. 11/04/15   Historical Provider, MD       Physical Exam: BP 114/74 mmHg  Pulse 71  Temp(Src) 98.5 F (36.9 C) (Oral)  Resp 17  Ht 5\' 3"  (1.6 m)  Wt 93.94 kg (207 lb 1.6 oz)  BMI 36.70 kg/m2  SpO2 98% General appearance: Well-developed, adult female, alert and in no acute distress.   Eyes: Anicteric, conjunctiva pink, lids and lashes normal.     ENT: No nasal deformity, discharge, or epistaxis.  OP moist without lesions.   Lymph: No cervical or supraclavicular lymphadenopathy. Skin: Warm and dry.  No jaundice.  No suspicious rashes or lesions. Cardiac: RRR, nl S1-S2, no murmurs appreciated.  Capillary refill is brisk.  JVP not visible.  No LE edema.  Radial and DP pulses 2+ and symmetric. Respiratory: Normal respiratory rate and rhythm.  CTAB without rales or wheezes. Abdomen: Abdomen soft without rigidity.  Mild LLQ TTP, slight rebound, no rigidity or guarding. No ascites, distension.   MSK: No deformities or effusions. Neuro: Cranial nerve normal.  Sensorium intact and responding to questions, attention normal.  Speech is fluent.  Moves all extremities equally and with normal coordination.    Psych: Behavior appropriate.  Affect normal.  No evidence of aural or visual hallucinations or delusions.       Labs on Admission:  I have personally reviewed following labs and imaging studies: CBC:  Recent Labs Lab 04/26/16 1514  WBC 6.4  HGB 12.6  HCT 36.6  MCV 91.3  PLT [id number]   Basic Metabolic Panel:  Recent Labs Lab 04/26/16 1514  NA 136  K 3.7  CL 105  CO2 27  GLUCOSE 97  BUN 7  CREATININE 0.98  CALCIUM 8.7*   GFR: Estimated Creatinine Clearance: 76.5 mL/min (by C-G formula based on Cr of  0.98). Liver Function Tests:  Recent Labs Lab 04/26/16 1514  AST 19  ALT 20  ALKPHOS 47  BILITOT 0.3  PROT 6.9  ALBUMIN 3.9    Recent Labs Lab 04/26/16 1514  LIPASE 24   No results for input(s): AMMONIA in the last 168 hours. Coagulation Profile: No results for input(s): INR, PROTIME in the last 168 hours. Cardiac Enzymes: No results for input(s): CKTOTAL, CKMB, CKMBINDEX, TROPONINI in the last 168 hours. BNP (last 3 results) No results for input(s): PROBNP in the last 8760 hours. HbA1C: No results for input(s): HGBA1C in the last 72 hours. CBG: No results for input(s): GLUCAP in the last 168 hours. Lipid Profile: No results for input(s): CHOL, HDL, LDLCALC, TRIG, CHOLHDL, LDLDIRECT in the last 72 hours. Thyroid Function  Tests: No results for input(s): TSH, T4TOTAL, FREET4, T3FREE, THYROIDAB in the last 72 hours. Anemia Panel: No results for input(s): VITAMINB12, FOLATE, FERRITIN, TIBC, IRON, RETICCTPCT in the last 72 hours. Urine analysis:    Component Value Date/Time   COLORURINE YELLOW 04/26/2016 1620   APPEARANCEUR CLEAR 04/26/2016 1620   LABSPEC 1.005 04/26/2016 1620   PHURINE 7.5 04/26/2016 1620   GLUCOSEU NEGATIVE 04/26/2016 1620   HGBUR NEGATIVE 04/26/2016 1620   BILIRUBINUR NEGATIVE 04/26/2016 1620   BILIRUBINUR neg 08/21/2012 0948   KETONESUR NEGATIVE 04/26/2016 1620   PROTEINUR NEGATIVE 04/26/2016 1620   PROTEINUR neg 08/21/2012 0948   UROBILINOGEN 0.2 08/21/2012 1138   UROBILINOGEN 0.2 08/21/2012 0948   NITRITE NEGATIVE 04/26/2016 1620   NITRITE neg 08/21/2012 0948   LEUKOCYTESUR NEGATIVE 04/26/2016 1620   Sepsis Labs: @LABRCNTIP (procalcitonin:4,lacticidven:4) )No results found for this or any previous visit (from the past 240 hour(s)).       Radiological Exams on Admission: Personally reviewed: Ct Abdomen Pelvis W Contrast  04/26/2016  CLINICAL DATA:  50 year old female with history of diverticulitis presenting with left lower quadrant  abdominal pain, nausea. EXAM: CT ABDOMEN AND PELVIS WITH CONTRAST TECHNIQUE: Multidetector CT imaging of the abdomen and pelvis was performed using the standard protocol following bolus administration of intravenous contrast. CONTRAST:  176mL ISOVUE-300 IOPAMIDOL (ISOVUE-300) INJECTION 61% COMPARISON:  CT dated 04/19/2016 FINDINGS: The visualized lung bases are clear. No intra-abdominal free air or free fluid. The the liver, gallbladder, pancreas, spleen, adrenal glands, kidneys, visualized ureters, and urinary bladder appear unremarkable. Hysterectomy. Large amount of dense noted throughout the colon. There are scattered sigmoid diverticula. There is active inflammatory changes of a diverticulum in the sigmoid colon in the similar location as the prior study most compatible with acute diverticulitis. There is a 1.7 x 2.0 cm low attenuating collection adjacent to the inflamed diverticula which may be fluid within the diverticular lumen or represent developing diverticular abscess. Clinical correlation and follow-up recommended. There is associated focal thickening of the anterior wall of the sigmoid colon, likely related to inflammatory process. Underlying mass is less likely but not entirely excluded. Further evaluation with colonoscopy after resolution of acute inflammatory process recommended. There is no evidence of bowel obstruction. Normal appendix. The abdominal aorta and IVC appear unremarkable. No portal venous gas identified. There is no adenopathy. There is a small fat containing umbilical hernia. The abdominal wall soft tissues are otherwise unremarkable. The osseous structures are intact. IMPRESSION: Acute diverticulitis of the sigmoid colon with possible developing diverticular abscess. Electronically Signed   By: Anner Crete M.D.   On: 04/26/2016 18:35    EKG: Independently reviewed ECG from one week ago shows sinus rhythm (tachycardia) and normal QTc.    Assessment/Plan 1. Diverticulitis  with possible abscess:  Diverticulitis, now second episode, with possible abscess per CT and given history of persistent pain and fevers despite oral therapy.   -IV ceftriaxone and metronidazole -Clear liquid diet -Ondansetron PRN for nausea -Acetaminophen, oxycodone PRN for pain, morphine IV if needed (discussed with patient who specifically verified she has tolerated in the past) -Serial abdominal exams   2. Anxiety/PTSD:  -Patient unsure of Cymbalta dose, will hold until can be confirmed with pharmacy  3. Insomnia:  Discussed risk of respiratory depression with combined alprazolam, zolpidem, risperidone, high dose trazodone.  She confirmed doses, has taken daily for years.  Has discussed risks with her primary psychiatrist. -Continue alprazolam, trazodone, and risperidone -Ambien PRN    DVT prophylaxis: SCDs  Code Status:  FULL  Family Communication: None present  Disposition Plan: Anticipate IV antibiotics and serial abdominal exams.  In 1-2 days, if no improvement, will consider re-imaging and IR consultation for percutaneous drainage. Consults called: None Medical decision making: Patient seen at 10:52 PM on 04/26/2016.  The patient was discussed with Dr. Stark Jock. I recommend admission to medical surgical bed, inpatient status.  Clinical condition: stable.         Edwin Dada [facility] Pager [phone]       At the time of admission, it appears that the appropriate admission status for this patient is INPATIENT. This is judged to be reasonable and necessary in order to provide the required intensity of service to ensure the patient's safety given the presenting symptoms, physical exam findings, and initial radiographic and laboratory data in the context of their chronic comorbidities.  Together, these circumstances are felt to place her/him at high risk for further clinical deterioration threatening life, limb, or organ. The following factors support the  admission status of inpatient:   A. The patient's presenting symptoms include persistent pain, fever B. The worrisome physical exam findings include LLQ tenderness C. The initial radiographic and laboratory data are worrisome because of diverticulitis despite appropriate therapy and apparent abscess formation. D. Patient requires inpatient status due to requirement for IV antibiotics after development of early abscess while on oral antibiotics and high potential for deterioration without it.   F. I certify that at the point of admission it is my clinical judgment that the patient will require inpatient hospital care spanning beyond 2 midnights from the point of admission.

## 2016-04-26 NOTE — ED Notes (Signed)
Report given to [facility] ETA 20 min

## 2016-04-26 NOTE — ED Notes (Signed)
Pt c/o itching.  Stopped cipro, flushed IV, and informed Dr. Stark Jock.  No hives or redness noted, except around IV tape where pt was scratching.

## 2016-04-26 NOTE — Progress Notes (Signed)
50 year old F with no significant PMHx presented 1 week ago with abdominal pain, CT showed diverticulitis, discharged from ED with oral cipro/Flagyl.    Now returns with persistent, now worsening pain.  WBC normal, Cr normal.  Afebrile, but CT abdomen shows developing abscess.    Will be admitted to med surg OBS status, IV cipro flagyl.

## 2016-04-26 NOTE — ED Notes (Signed)
Pt states itching is going away and her voice feels better.

## 2016-04-26 NOTE — ED Notes (Signed)
Patient transported to CT via stretcher per tech. 

## 2016-04-26 NOTE — ED Notes (Signed)
Pt transferred to [facility] via [place] with belongings.

## 2016-04-26 NOTE — ED Notes (Signed)
Abdominal pain. No BM in 2 weeks. Hx of diverticulitis.

## 2016-04-26 NOTE — ED Notes (Signed)
Told Dr. Stark Jock pt is requesting more pain medications

## 2016-04-27 DIAGNOSIS — K5792 Diverticulitis of intestine, part unspecified, without perforation or abscess without bleeding: Secondary | ICD-10-CM

## 2016-04-27 LAB — CBC
HCT: 34.3 % — ABNORMAL LOW (ref 36.0–46.0)
Hemoglobin: 11.6 g/dL — ABNORMAL LOW (ref 12.0–15.0)
MCH: 31.3 pg (ref 26.0–34.0)
MCHC: 33.8 g/dL (ref 30.0–36.0)
MCV: 92.5 fL (ref 78.0–100.0)
PLATELETS: 215 10*3/uL (ref 150–400)
RBC: 3.71 MIL/uL — ABNORMAL LOW (ref 3.87–5.11)
RDW: 12 % (ref 11.5–15.5)
WBC: 5.5 10*3/uL (ref 4.0–10.5)

## 2016-04-27 LAB — CBC WITH DIFFERENTIAL/PLATELET
BASOS PCT: 0 %
Basophils Absolute: 0 10*3/uL (ref 0.0–0.1)
EOS ABS: 0.2 10*3/uL (ref 0.0–0.7)
Eosinophils Relative: 3 %
HEMATOCRIT: 33.5 % — AB (ref 36.0–46.0)
HEMOGLOBIN: 11.2 g/dL — AB (ref 12.0–15.0)
Lymphocytes Relative: 28 %
Lymphs Abs: 1.4 10*3/uL (ref 0.7–4.0)
MCH: 30.5 pg (ref 26.0–34.0)
MCHC: 33.4 g/dL (ref 30.0–36.0)
MCV: 91.3 fL (ref 78.0–100.0)
MONOS PCT: 8 %
Monocytes Absolute: 0.4 10*3/uL (ref 0.1–1.0)
NEUTROS ABS: 3 10*3/uL (ref 1.7–7.7)
Neutrophils Relative %: 61 %
Platelets: 217 10*3/uL (ref 150–400)
RBC: 3.67 MIL/uL — AB (ref 3.87–5.11)
RDW: 12 % (ref 11.5–15.5)
WBC: 5 10*3/uL (ref 4.0–10.5)

## 2016-04-27 LAB — COMPREHENSIVE METABOLIC PANEL
ALT: 18 U/L (ref 14–54)
AST: 15 U/L (ref 15–41)
Albumin: 2.8 g/dL — ABNORMAL LOW (ref 3.5–5.0)
Alkaline Phosphatase: 38 U/L (ref 38–126)
Anion gap: 12 (ref 5–15)
CHLORIDE: 106 mmol/L (ref 101–111)
CO2: 24 mmol/L (ref 22–32)
Calcium: 8.3 mg/dL — ABNORMAL LOW (ref 8.9–10.3)
Creatinine, Ser: 0.92 mg/dL (ref 0.44–1.00)
Glucose, Bld: 80 mg/dL (ref 65–99)
POTASSIUM: 3.8 mmol/L (ref 3.5–5.1)
SODIUM: 142 mmol/L (ref 135–145)
Total Bilirubin: 0.4 mg/dL (ref 0.3–1.2)
Total Protein: 5.5 g/dL — ABNORMAL LOW (ref 6.5–8.1)

## 2016-04-27 MED ORDER — PIPERACILLIN-TAZOBACTAM 3.375 G IVPB 30 MIN
3.3750 g | Freq: Four times a day (QID) | INTRAVENOUS | Status: DC
  Administered 2016-04-27 – 2016-04-30 (×12): 3.375 g via INTRAVENOUS
  Filled 2016-04-27 (×15): qty 50

## 2016-04-27 MED ORDER — DEXTROSE IN LACTATED RINGERS 5 % IV SOLN
INTRAVENOUS | Status: DC
  Administered 2016-04-27 – 2016-04-28 (×2): via INTRAVENOUS

## 2016-04-27 MED ORDER — MORPHINE SULFATE (PF) 4 MG/ML IV SOLN
4.0000 mg | INTRAVENOUS | Status: DC | PRN
  Administered 2016-04-27 – 2016-04-29 (×10): 4 mg via INTRAVENOUS
  Filled 2016-04-27 (×11): qty 1

## 2016-04-27 MED ORDER — HEPARIN SODIUM (PORCINE) 5000 UNIT/ML IJ SOLN
5000.0000 [IU] | Freq: Three times a day (TID) | INTRAMUSCULAR | Status: DC
  Administered 2016-04-27 – 2016-04-30 (×8): 5000 [IU] via SUBCUTANEOUS
  Filled 2016-04-27 (×8): qty 1

## 2016-04-27 MED ORDER — DEXTROSE 5 % IV SOLN
1000.0000 mg | Freq: Three times a day (TID) | INTRAVENOUS | Status: DC | PRN
  Administered 2016-04-27: 1000 mg via INTRAVENOUS
  Filled 2016-04-27 (×2): qty 10

## 2016-04-27 NOTE — Consult Note (Signed)
[facility] Surgery Consult Note  Deborah Pena Jan 12, 1966  [id number].    Requesting MD: Dr. Cathlean Sauer Chief Complaint/Reason for Consult: Diverticulitis with perf/abscess  HPI:  50 y/o very anxious white female with behavioral health diagnosis requiring risperdal, xanax, focalin, cymbalta, ambien, and trazodone presented to [facility] on 04/26/16 for stabbing LLQ and suprapubic abdominal pain which has been going on for 1 month.  She was seen in the ED on 04/19/16 and sent home on oral antibiotics.  Her pain re-occurred.  She denies any frequent urination, urinary retention, dysuria, fever/chills, N/V, but has relative anorexia and pain with eating.  Despite this she is currently thirsty/hungry.  She notes she hasn't had a BM in 14 days.  She is not normally constipated, she tried a stool softener, miralax and an enema without relief.  She says her flairs are precipitated by alcohol use, drinking even liquids, and stress.  No alleviating factors.  Pain does not radiate.  Tells me she had a liver abscess with her robotic hysterectomy which was treated with IV antibiotics.  She admits to an enormous amount of stress when it relates to being sick and in pain and with dealing with her difficult daughter.  Of note the patient states she has had "more than 20 episodes" of diverticulitis in the last 13 years.  Last flair prior to May was a few months ago reportedly.  She said she was first seen in [place] for this problem prior to moving here.  She has reportedly never been referred to a Psychologist, sport and exercise.  Only ever treated with oral or IV antibiotics.  Some admissions and some OP treatment for this.  She was seen by Dr. Michail Sermon for a colonoscopy in 2009 for diarrhea, rectal bleeding and weight loss which did not show diverticula.  Biopsies were completely benign.     ROS: All systems reviewed and otherwise negative except for as above  Family History  Problem Relation Age of Onset  . Heart failure Father   .  Diabetes Other   . Breast cancer Other   . Diverticulitis Other     Past Medical History  Diagnosis Date  . Diverticulitis of colon   . Asthma   . Anginal pain ([place])   . Anemia   . GERD (gastroesophageal reflux disease)   . Stomach ulcer   . Headache     'once or twice/week" (04/26/2016)  . Migraine     "twice/month" (04/26/2016)  . Arthritis     "left hand" (04/26/2016)  . Chronic lower back pain   . Anxiety     Past Surgical History  Procedure Laterality Date  . Bunionectomy Right 2013  . Tonsillectomy    . Breast surgery Bilateral ~ 2000    "dual lateral duct removal"   . Abdominal hysterectomy  2013    preformed at [facility] by Dr Jacqualyn Posey in April  . Cesarean section  1999  . Oophorectomy Right 2013    Social History:  reports that she has quit smoking. Her smoking use included Cigarettes. She has a 60 pack-year smoking history. She has quit using smokeless tobacco. Her smokeless tobacco use included Chew. She reports that she does not drink alcohol or use illicit drugs.  Allergies:  Allergies  Allergen Reactions  . Buprenorphine Hcl Other (See Comments)    "Becomes psycotic." --per notes from another healthcare network.   . Ciprofloxacin Itching    Sore throat, hoarseness  . Codeine Itching    Itches from inside out. PEr pt  she can tolerate now.  . Morphine And Related Other (See Comments)    Becomes phsycotic.Per pt she is able to tolerate now.  . Propranolol     Syncope.     Medications Prior to Admission  Medication Sig Dispense Refill  . ALPRAZolam (XANAX XR) 1 MG 24 hr tablet Take 1 mg by mouth daily.  0  . bismuth subsalicylate (PEPTO BISMOL) 262 MG/15ML suspension Take 30 mLs by mouth every 6 (six) hours as needed for indigestion or diarrhea or loose stools.    Marland Kitchen dexmethylphenidate (FOCALIN) 10 MG tablet Take 10-20 mg by mouth daily as needed (in between focalin XR 72m if needed for ADHD).     . Dexmethylphenidate HCl (FOCALIN XR) 40 MG CP24 Take 40 mg  by mouth 2 (two) times daily.    .Marland KitchendimenhyDRINATE (DRAMAMINE) 50 MG tablet Take 150 mg by mouth every 8 (eight) hours as needed for dizziness.    . DULoxetine (CYMBALTA) 60 MG capsule Take 60 mg by mouth daily.    . ondansetron (ZOFRAN) 4 MG tablet Take 1 tablet (4 mg total) by mouth every 6 (six) hours. 12 tablet 0  . oxyCODONE-acetaminophen (ROXICET) 5-325 MG tablet Take 1 tablet by mouth every 4 (four) hours as needed for severe pain. 15 tablet 0  . risperiDONE (RISPERDAL) 3 MG tablet Take 9 mg by mouth at bedtime.    . traZODone (DESYREL) 150 MG tablet Take 300 mg by mouth at bedtime.   0  . zolpidem (AMBIEN) 10 MG tablet Take 10 mg by mouth at bedtime as needed for sleep.    . [DISCONTINUED] ciprofloxacin (CIPRO) 500 MG tablet Take 1 tablet (500 mg total) by mouth every 12 (twelve) hours. 20 tablet 0  . [DISCONTINUED] metroNIDAZOLE (FLAGYL) 500 MG tablet Take 1 tablet (500 mg total) by mouth 3 (three) times daily. 30 tablet 0    Blood pressure 90/40, pulse 87, temperature 97.7 F (36.5 C), temperature source Oral, resp. rate 18, height 5' 3"  (1.6 m), weight 207 lb 1.6 oz (93.94 kg), SpO2 97 %. Physical Exam: General: pleasant, WD/WN white female who is laying in bed in NAD HEENT: head is normocephalic, atraumatic.  Sclera are noninjected.  PERRL.  Ears and nose without any masses or lesions.  Mouth is pink and moist Heart: regular, rate, and rhythm.  No obvious murmurs, gallops, or rubs noted.  Palpable pedal pulses bilaterally Lungs: CTAB, no wheezes, rhonchi, or rales noted.  Respiratory effort nonlabored Abd: soft, ND, tender in the suprapubic and LLQ, no pain in upper abdomen or RLQ, +BS, no masses, hernias, or organomegaly, well healed robotic scars noted across abdomen (from robotic hysterectomy) MS: all 4 extremities are symmetrical with no cyanosis, clubbing, or edema. Skin: warm and dry with no masses, lesions, or rashes Psych: A&Ox3, very anxious, frazzled, emotional   Results  for orders placed or performed during the hospital encounter of 04/26/16 (from the past 48 hour(s))  Lipase, blood     Status: None   Collection Time: 04/26/16  3:14 PM  Result Value Ref Range   Lipase 24 11 - 51 U/L  Comprehensive metabolic panel     Status: Abnormal   Collection Time: 04/26/16  3:14 PM  Result Value Ref Range   Sodium 136 135 - 145 mmol/L   Potassium 3.7 3.5 - 5.1 mmol/L   Chloride 105 101 - 111 mmol/L   CO2 27 22 - 32 mmol/L   Glucose, Bld 97 65 - 99 mg/dL  BUN 7 6 - 20 mg/dL   Creatinine, Ser 0.98 0.44 - 1.00 mg/dL   Calcium 8.7 (L) 8.9 - 10.3 mg/dL   Total Protein 6.9 6.5 - 8.1 g/dL   Albumin 3.9 3.5 - 5.0 g/dL   AST 19 15 - 41 U/L   ALT 20 14 - 54 U/L   Alkaline Phosphatase 47 38 - 126 U/L   Total Bilirubin 0.3 0.3 - 1.2 mg/dL   GFR calc non Af Amer >60 >60 mL/min   GFR calc Af Amer >60 >60 mL/min    Comment: (NOTE) The eGFR has been calculated using the CKD EPI equation. This calculation has not been validated in all clinical situations. eGFR's persistently <60 mL/min signify possible Chronic Kidney Disease.    Anion gap 4 (L) 5 - 15  CBC     Status: None   Collection Time: 04/26/16  3:14 PM  Result Value Ref Range   WBC 6.4 4.0 - 10.5 K/uL   RBC 4.01 3.87 - 5.11 MIL/uL   Hemoglobin 12.6 12.0 - 15.0 g/dL   HCT 36.6 36.0 - 46.0 %   MCV 91.3 78.0 - 100.0 fL   MCH 31.4 26.0 - 34.0 pg   MCHC 34.4 30.0 - 36.0 g/dL   RDW 12.0 11.5 - 15.5 %   Platelets 241 150 - 400 K/uL  Urinalysis, Routine w reflex microscopic     Status: None   Collection Time: 04/26/16  4:20 PM  Result Value Ref Range   Color, Urine YELLOW YELLOW   APPearance CLEAR CLEAR   Specific Gravity, Urine 1.005 1.005 - 1.030   pH 7.5 5.0 - 8.0   Glucose, UA NEGATIVE NEGATIVE mg/dL   Hgb urine dipstick NEGATIVE NEGATIVE   Bilirubin Urine NEGATIVE NEGATIVE   Ketones, ur NEGATIVE NEGATIVE mg/dL   Protein, ur NEGATIVE NEGATIVE mg/dL   Nitrite NEGATIVE NEGATIVE   Leukocytes, UA NEGATIVE  NEGATIVE    Comment: MICROSCOPIC NOT DONE ON URINES WITH NEGATIVE PROTEIN, BLOOD, LEUKOCYTES, NITRITE, OR GLUCOSE <1000 mg/dL.  CBC     Status: Abnormal   Collection Time: 04/27/16  4:48 AM  Result Value Ref Range   WBC 5.5 4.0 - 10.5 K/uL   RBC 3.71 (L) 3.87 - 5.11 MIL/uL   Hemoglobin 11.6 (L) 12.0 - 15.0 g/dL   HCT 34.3 (L) 36.0 - 46.0 %   MCV 92.5 78.0 - 100.0 fL   MCH 31.3 26.0 - 34.0 pg   MCHC 33.8 30.0 - 36.0 g/dL   RDW 12.0 11.5 - 15.5 %   Platelets 215 150 - 400 K/uL  Comprehensive metabolic panel     Status: Abnormal   Collection Time: 04/27/16  4:48 AM  Result Value Ref Range   Sodium 142 135 - 145 mmol/L   Potassium 3.8 3.5 - 5.1 mmol/L   Chloride 106 101 - 111 mmol/L   CO2 24 22 - 32 mmol/L   Glucose, Bld 80 65 - 99 mg/dL   BUN <5 (L) 6 - 20 mg/dL   Creatinine, Ser 0.92 0.44 - 1.00 mg/dL   Calcium 8.3 (L) 8.9 - 10.3 mg/dL   Total Protein 5.5 (L) 6.5 - 8.1 g/dL   Albumin 2.8 (L) 3.5 - 5.0 g/dL   AST 15 15 - 41 U/L   ALT 18 14 - 54 U/L   Alkaline Phosphatase 38 38 - 126 U/L   Total Bilirubin 0.4 0.3 - 1.2 mg/dL   GFR calc non Af Amer >60 >60 mL/min  GFR calc Af Amer >60 >60 mL/min    Comment: (NOTE) The eGFR has been calculated using the CKD EPI equation. This calculation has not been validated in all clinical situations. eGFR's persistently <60 mL/min signify possible Chronic Kidney Disease.    Anion gap 12 5 - 15   Ct Abdomen Pelvis W Contrast  04/26/2016  CLINICAL DATA:  50 year old female with history of diverticulitis presenting with left lower quadrant abdominal pain, nausea. EXAM: CT ABDOMEN AND PELVIS WITH CONTRAST TECHNIQUE: Multidetector CT imaging of the abdomen and pelvis was performed using the standard protocol following bolus administration of intravenous contrast. CONTRAST:  161m ISOVUE-300 IOPAMIDOL (ISOVUE-300) INJECTION 61% COMPARISON:  CT dated 04/19/2016 FINDINGS: The visualized lung bases are clear. No intra-abdominal free air or free fluid.  The the liver, gallbladder, pancreas, spleen, adrenal glands, kidneys, visualized ureters, and urinary bladder appear unremarkable. Hysterectomy. Large amount of dense noted throughout the colon. There are scattered sigmoid diverticula. There is active inflammatory changes of a diverticulum in the sigmoid colon in the similar location as the prior study most compatible with acute diverticulitis. There is a 1.7 x 2.0 cm low attenuating collection adjacent to the inflamed diverticula which may be fluid within the diverticular lumen or represent developing diverticular abscess. Clinical correlation and follow-up recommended. There is associated focal thickening of the anterior wall of the sigmoid colon, likely related to inflammatory process. Underlying mass is less likely but not entirely excluded. Further evaluation with colonoscopy after resolution of acute inflammatory process recommended. There is no evidence of bowel obstruction. Normal appendix. The abdominal aorta and IVC appear unremarkable. No portal venous gas identified. There is no adenopathy. There is a small fat containing umbilical hernia. The abdominal wall soft tissues are otherwise unremarkable. The osseous structures are intact. IMPRESSION: Acute diverticulitis of the sigmoid colon with possible developing diverticular abscess. Electronically Signed   By: AAnner CreteM.D.   On: 04/26/2016 18:35      Assessment/Plan Recurrent diverticulitis with perforation and abscess Anxiety/other behavioral health diagnosis - cant view these due to glass locks on epic H/o robotic hysterectomy with liver abscess complication  Plan: 1.  Conservative management, NPO/bowel rest, IVF, pain control, antiemetics.  She says her symptoms have been going on for about 13 years and she's had at least 20 episodes of diverticulitis.  [place]  She has never been referred to a gEducation officer, environmental  She had a colonoscopy  in 2009 which was normal.  CT in 2011 did not show diverticulitis.  CT in 2013 Neg for diverticulitis.  CT 12/17/15 showed mild acute diverticulitis.  CT 04/19/16 showed mild acute diverticulitis, and CT on 5/8 showed diverticulitis with developing abscess. 2.  IV antibiotics - was on rocephin/flagyl, now on Zosyn Day #1 3.  SCD's and lovenox for DVT proph 4.  Ambulate and IS 5.  Hopefully the patient will resolve with conservative management alone, but if she does not progress she may need a colectomy/colostomy.  She does not want a colostomy.  Her WBC is normal, vitals normal, but pain significant.  We will follow her progress.  Unsure if she's truly had as many episodes as she say she has, but if it truly is the case we would consider colectomy with anastomosis once this flair is resolved.  She would need a colonoscopy in 6-8 weeks and follow up with [place] the office.   MNat Christen [address] [facility] 04/27/2016, 4:28  PM Pager: [phone] (7am - 4:30pm M-F; 7am - 11:30am Sa/Su)

## 2016-04-27 NOTE — Progress Notes (Signed)
TRH Progress Note                                            Patient Demographics:    Deborah Pena, is a 50 y.o. female, DOB - 1966/01/22, [id number]  Admit date - 04/26/2016   Admitting Physician Edwin Dada, MD  Outpatient Primary MD for the patient is [place]  LOS - 1  Outpatient Specialists:  Chief Complaint  Patient presents with  . Abdominal Pain        Subjective:    Deborah Pena has persistent abdominal pain, colic in nature, worse to touch, no mild improvement with pain meds, associated with nausea. On clears.    Assessment  & Plan :    Principal Problem:   Acute diverticulitis Active Problems:   Diverticulitis of large intestine with abscess without bleeding   1.Cardiovascular. Will continue IV fluids at 100 ml/hr, will change ceftriaxone and metronidazole to Zosyn. Consulted surgery to follow on abscess.   2. Pulmonary: no signs of volume overload, will continue to monitor oxymetry, supplemental 02 per [place] to target 02 sat above 92%  3. Nephrology. Renal function with cr at 0.92 with Na 142, will continue gentle hydration with IV fludis dextrose and ringers, follow renal panel in am.  4. Gastroenterology. Complicated diveriticulitis with possible abscess, will continue broad spectrum antibiotic therapy with zosyn, patient  Allergic to ciprofloxacin. Will continue NPO, and follow with surgery recommendations.     Code Status :   Family Communication  :   Disposition Plan  :  Barriers For Discharge :   Consults  :  surgery  Procedures  :  DVT Prophylaxis  :  Lovenox   Lab Results   Component Value Date   PLT 215 04/27/2016    Antibiotics  :  Zosyn # 0  Anti-infectives    Start     Dose/Rate Route Frequency Ordered Stop   04/27/16 0400  metroNIDAZOLE (FLAGYL) IVPB 500 mg     500 mg 100 mL/hr over 60 Minutes Intravenous Every 8 hours 04/26/16 2307     04/27/16 0000  cefTRIAXone (ROCEPHIN) 2 g in dextrose 5 % 50 mL IVPB     2 g 100 mL/hr over 30 Minutes Intravenous Every 24 hours 04/26/16 2308     04/26/16 2300  metroNIDAZOLE (FLAGYL) IVPB 500 mg  Status:  Discontinued     500 mg 100 mL/hr over 60 Minutes Intravenous Every 8 hours 04/26/16 2252 04/26/16 2307   04/26/16 2000  ciprofloxacin (CIPRO) IVPB 400 mg     400 mg 200 mL/hr over 60 Minutes Intravenous  Once 04/26/16 1955 04/26/16 2043   04/26/16 2000  metroNIDAZOLE (FLAGYL) IVPB 500 mg     500 mg 100 mL/hr over 60 Minutes Intravenous  Once 04/26/16 1955 04/26/16 2140        Objective:   Filed Vitals:   04/26/16 2036 04/26/16 2136 04/27/16 0458 04/27/16 1300  BP: 109/67 114/74 114/70 90/40  Pulse: 78 71 79 87  Temp: 98.2 F (36.8 C) 98.5 F (36.9 C) 98.3 F (36.8 C) 97.7 F (36.5 C)  TempSrc: Oral Oral Oral Oral  Resp: 16 17 18    Height:  5\' 3"  (1.6 m)    Weight:  93.94 kg (207 lb 1.6 oz)    SpO2: 95% 98% 99% 97%    Wt Readings from Last 3 Encounters:  04/26/16 93.94 kg (207 lb 1.6 oz)  04/29/13 95.255 kg (210 lb)  08/24/12 106.958 kg (235 lb 12.8 oz)     Intake/Output Summary (Last 24 hours) at 04/27/16 1533 Last data filed at 04/27/16 1300  Gross per 24 hour  Intake 1386.25 ml  Output   3900 ml  Net -2513.75 ml     Physical Exam  Awake Alert, Oriented X 3, No new F.N deficits, Normal affect, ill looking appearing, in pain. [place].AT,PERRAL, oral mucosa dry. Supple Neck,No JVD, No cervical lymphadenopathy appriciated.  Symmetrical Chest wall movement, Good air movement bilaterally, CTAB RRR,No Gallops,Rubs or new Murmurs, No Parasternal Heave Abdomen tender to palpation,  decrease bowel sounds, mild distention.  No Cyanosis, Clubbing or edema, No new Rash or bruise      Data Review:    CBC  Recent Labs Lab 04/26/16 1514 04/27/16 0448  WBC 6.4 5.5  HGB 12.6 11.6*  HCT 36.6 34.3*  PLT 241 215  MCV 91.3 92.5  MCH 31.4 31.3  MCHC 34.4 33.8  RDW 12.0 12.0    Chemistries   Recent Labs Lab 04/26/16 1514 04/27/16 0448  NA 136 142  K 3.7 3.8  CL 105 106  CO2 27 24  GLUCOSE 97 80  BUN 7 <5*  CREATININE 0.98 0.92  CALCIUM 8.7* 8.3*  AST 19 15  ALT 20 18  ALKPHOS 47 38  BILITOT 0.3 0.4   ------------------------------------------------------------------------------------------------------------------ No results for input(s): CHOL, HDL, LDLCALC, TRIG, CHOLHDL, LDLDIRECT in the last 72 hours.  Lab Results  Component Value Date   HGBA1C 5.1 08/21/2012   ------------------------------------------------------------------------------------------------------------------ No results for input(s): TSH, T4TOTAL, T3FREE, THYROIDAB in the last 72 hours.  Invalid input(s): FREET3 ------------------------------------------------------------------------------------------------------------------ No results for input(s): VITAMINB12, FOLATE, FERRITIN, TIBC, IRON, RETICCTPCT in the last 72 hours.  Coagulation profile No results for input(s): INR, PROTIME in the last 168 hours.  No results for input(s): DDIMER in the last 72 hours.  Cardiac Enzymes No results for input(s): CKMB, TROPONINI, MYOGLOBIN in the last 168 hours.  Invalid input(s): CK ------------------------------------------------------------------------------------------------------------------ No results found for: BNP  Inpatient Medications  Scheduled Meds: . cefTRIAXone (ROCEPHIN)  IV  2 g Intravenous Q24H  . metronidazole  500 mg Intravenous Q8H  . risperiDONE  9 mg Oral QHS  . traZODone  300 mg Oral QHS   Continuous Infusions:  PRN Meds:.acetaminophen **OR** acetaminophen,  ALPRAZolam, morphine injection, ondansetron **OR** ondansetron (ZOFRAN) IV, oxyCODONE, zolpidem  Micro Results No results found for this or any previous visit (from the past 240 hour(s)).  Radiology Reports Dg Chest 2 View  04/19/2016  CLINICAL DATA:  Left-sided chest pain and tachycardia along with night sweats for 2 weeks. EXAM: CHEST  2 VIEW COMPARISON:  Tonight 1,010 FINDINGS: The cardiac silhouette, mediastinal and hilar  contours are normal. The lungs are clear. No pleural effusion. The bony thorax is intact. IMPRESSION: No acute cardiopulmonary findings.  Is Electronically Signed   By: Marijo Sanes M.D.   On: 04/19/2016 16:24   Ct Abdomen Pelvis W Contrast  04/26/2016  CLINICAL DATA:  50 year old female with history of diverticulitis presenting with left lower quadrant abdominal pain, nausea. EXAM: CT ABDOMEN AND PELVIS WITH CONTRAST TECHNIQUE: Multidetector CT imaging of the abdomen and pelvis was performed using the standard protocol following bolus administration of intravenous contrast. CONTRAST:  162mL ISOVUE-300 IOPAMIDOL (ISOVUE-300) INJECTION 61% COMPARISON:  CT dated 04/19/2016 FINDINGS: The visualized lung bases are clear. No intra-abdominal free air or free fluid. The the liver, gallbladder, pancreas, spleen, adrenal glands, kidneys, visualized ureters, and urinary bladder appear unremarkable. Hysterectomy. Large amount of dense noted throughout the colon. There are scattered sigmoid diverticula. There is active inflammatory changes of a diverticulum in the sigmoid colon in the similar location as the prior study most compatible with acute diverticulitis. There is a 1.7 x 2.0 cm low attenuating collection adjacent to the inflamed diverticula which may be fluid within the diverticular lumen or represent developing diverticular abscess. Clinical correlation and follow-up recommended. There is associated focal thickening of the anterior wall of the sigmoid colon, likely related to inflammatory  process. Underlying mass is less likely but not entirely excluded. Further evaluation with colonoscopy after resolution of acute inflammatory process recommended. There is no evidence of bowel obstruction. Normal appendix. The abdominal aorta and IVC appear unremarkable. No portal venous gas identified. There is no adenopathy. There is a small fat containing umbilical hernia. The abdominal wall soft tissues are otherwise unremarkable. The osseous structures are intact. IMPRESSION: Acute diverticulitis of the sigmoid colon with possible developing diverticular abscess. Electronically Signed   By: Anner Crete M.D.   On: 04/26/2016 18:35   Ct Abdomen Pelvis W Contrast  04/19/2016  CLINICAL DATA:  Abdominal pain.  History of diverticulitis. EXAM: CT ABDOMEN AND PELVIS WITH CONTRAST TECHNIQUE: Multidetector CT imaging of the abdomen and pelvis was performed using the standard protocol following bolus administration of intravenous contrast. CONTRAST:  112mL ISOVUE-300 IOPAMIDOL (ISOVUE-300) INJECTION 61% COMPARISON:  CT abdomen pelvis - 02/16/2015 FINDINGS: Lower chest: Limited visualization of the lower thorax demonstrates minimal dependent subpleural ground-glass atelectasis, left greater than right. No focal airspace opacities. No pleural effusion. Normal heart size.  No pericardial effusion. Hepatobiliary: Normal hepatic contour. There is a minimal amount of focal fatty infiltration adjacent to the fissure for ligamentum teres. No discrete hepatic lesions. Normal appearance of the gallbladder given degree distention. No radiopaque gallstones. No intra or extrahepatic biliary ductal dilatation. No ascites. Pancreas: Normal in appearance Spleen: Normal in appearance.  Note is made of a small splenule. Adrenals/Urinary Tract: There is symmetric enhancement and excretion of the bilateral kidneys. No definite renal stones this postcontrast examination. Punctate (approximately 0.6 cm) hypo attenuating lesion within  in the left kidney is too small to adequately characterize of favored to represent renal cysts. No discrete left-sided renal lesions. No urinary obstruction a perinephric stranding. Normal appearance of the bilateral adrenal glands. Normal appearance of the urinary bladder given degree distention. Stomach/Bowel: Ingested enteric contrast extends to the level of the rectum. Rather extensive colonic diverticulosis with ill-defined stranding about the anterior cranial aspect of the sigmoid colon within the midline of the lower abdomen/upper pelvis (image 75, series 2), compatible with acute uncomplicated diverticulitis. No evidence of perforation or definable/drainable fluid collection. The bowel is otherwise normal  in course and caliber without wall thickening. Moderate colonic stool burden without evidence of enteric obstruction. Normal appearance of the terminal ileum. The appendix is not visualized, however there is no pericecal inflammatory change. Vascular/Lymphatic: Normal caliber of the abdominal aorta. The major branch vessels of the abdominal aorta appear widely patent on this non CTA examination. No bulky retroperitoneal, mesenteric, pelvic or inguinal lymphadenopathy. Reproductive: Post hysterectomy. No discrete adnexal lesion. No free fluid in the pelvic cul-de-sac. Other: Tiny mesenteric fat containing periumbilical hernia. Regional soft tissues appear otherwise normal. Musculoskeletal: No acute or aggressive osseous abnormalities. IMPRESSION: Acute uncomplicated diverticulitis involving the anterior cranial aspect of the sigmoid colon within the midline of the lower abdomen / pelvis without evidence of perforation or definable / fluid collection. This area of acute diverticulitis appears slightly downstream from prior episode seen on the 12/17/2015 examination though if not recently performed, further evaluation with colonoscopy after the resolution of acute symptoms is recommended. Electronically Signed    By: Sandi Mariscal M.D.   On: 04/19/2016 21:00    Time Spent in minutes      Tawni Millers M.D on 04/27/2016 at 3:33 PM  Between 7am to 7pm - Pager   After 7pm go to [email or web address] - password [facility]  [facility] -  Office  [phone]

## 2016-04-27 NOTE — Care Management Note (Signed)
Case Management Note  Patient Details  Name: Leylanni Benefield MRN: [id number] Date of Birth: [birth year]  Subjective/Objective:                    Action/Plan:  Initial UR completed  Expected Discharge Date:                  Expected Discharge Plan:  Home/Self Care  In-House Referral:     Discharge planning Services     Post Acute Care Choice:    Choice offered to:     DME Arranged:    DME Agency:     HH Arranged:    [place] Agency:     Status of Service:  In process, will continue to follow  Medicare Important Message Given:    Date Medicare IM Given:    Medicare IM give by:    Date Additional Medicare IM Given:    Additional Medicare Important Message give by:     If discussed at [place] of Stay Meetings, dates discussed:    Additional Comments:  Marilu Favre, RN 04/27/2016, 11:00 AM

## 2016-04-28 LAB — BASIC METABOLIC PANEL
Anion gap: 14 (ref 5–15)
CALCIUM: 9 mg/dL (ref 8.9–10.3)
CO2: 23 mmol/L (ref 22–32)
Chloride: 109 mmol/L (ref 101–111)
Creatinine, Ser: 0.82 mg/dL (ref 0.44–1.00)
GFR calc non Af Amer: >60 mL/min (ref >60)
Glucose, Bld: 108 mg/dL — ABNORMAL HIGH (ref 65–99)
Potassium: 3.5 mmol/L (ref 3.5–5.1)
SODIUM: 146 mmol/L — AB (ref 135–145)

## 2016-04-28 MED ORDER — DEXTROSE-NACL 5-0.45 % IV SOLN
INTRAVENOUS | Status: DC
  Administered 2016-04-28 – 2016-04-29 (×3): via INTRAVENOUS

## 2016-04-28 MED ORDER — HYPROMELLOSE (GONIOSCOPIC) 2.5 % OP SOLN
2.0000 [drp] | Freq: Four times a day (QID) | OPHTHALMIC | Status: DC
  Administered 2016-04-29: 2 [drp] via OPHTHALMIC
  Filled 2016-04-28: qty 15

## 2016-04-28 NOTE — Progress Notes (Signed)
Patient ID: Deborah Pena, female   DOB: 09-22-1966, 50 y.o.   MRN: [id number]     [address] [place], [place] [address]    Phone: [phone] FAX: [phone]     Subjective: Pain is improved.  Had 2 solid BMs this AM.  Afebrile.   Objective:  Vital signs:  Filed Vitals:   04/27/16 1300 04/27/16 1645 04/27/16 2100 04/28/16 0517  BP: 90/40 113/69 118/60 108/59  Pulse: 87 70 75 76  Temp: 97.7 F (36.5 C) 97.8 F (36.6 C) 98.2 F (36.8 C) 98.8 F (37.1 C)  TempSrc: Oral Oral Oral   Resp:  _0 Height:      Weight:      SpO2: 97% 96% 100% 97%    Last BM Date: 04/16/16  Intake/Output   Yesterday:  05/09 0701 - 05/10 0700 In: 1930 [P.O.:480; I.V.:1350; IV Piggyback:100] Out: 9892 [Urine:5500] This shift: I/O last 3 completed shifts: In: 3076.3 [P.O.:720; I.V.:2106.3; IV Piggyback:250] Out: 7400 [Urine:7400]     Physical Exam: General: Pt awake/alert/oriented x4 in no acute distress  Abdomen: Soft.  Nondistended.  Suprapubic tenderness, rlq.  No evidence of peritonitis.  No incarcerated hernias.    Problem List:   Principal Problem:   Acute diverticulitis Active Problems:   Diverticulitis of large intestine with abscess without bleeding    Results:   Labs: Results for orders placed or performed during the hospital encounter of 04/26/16 (from the past 48 hour(s))  Lipase, blood     Status: None   Collection Time: 04/26/16  3:14 PM  Result Value Ref Range   Lipase 24 11 - 51 U/L  Comprehensive metabolic panel     Status: Abnormal   Collection Time: 04/26/16  3:14 PM  Result Value Ref Range   Sodium 136 135 - 145 mmol/L   Potassium 3.7 3.5 - 5.1 mmol/L   Chloride 105 101 - 111 mmol/L   CO2 27 22 - 32 mmol/L   Glucose, Bld 97 65 - 99 mg/dL   BUN 7 6 - 20 mg/dL   Creatinine, Ser 0.98 0.44 - 1.00 mg/dL   Calcium 8.7 (L) 8.9 - 10.3 mg/dL   Total Protein 6.9 6.5 - 8.1 g/dL    Albumin 3.9 3.5 - 5.0 g/dL   AST 19 15 - 41 U/L   ALT 20 14 - 54 U/L   Alkaline Phosphatase 47 38 - 126 U/L   Total Bilirubin 0.3 0.3 - 1.2 mg/dL   GFR calc non Af Amer >60 >60 mL/min   GFR calc Af Amer >60 >60 mL/min    Comment: (NOTE) The eGFR has been calculated using the CKD EPI equation. This calculation has not been validated in all clinical situations. eGFR's persistently <60 mL/min signify possible Chronic Kidney Disease.    Anion gap 4 (L) 5 - 15  CBC     Status: None   Collection Time: 04/26/16  3:14 PM  Result Value Ref Range   WBC 6.4 4.0 - 10.5 K/uL   RBC 4.01 3.87 - 5.11 MIL/uL   Hemoglobin 12.6 12.0 - 15.0 g/dL   HCT 36.6 36.0 - 46.0 %   MCV 91.3 78.0 - 100.0 fL   MCH 31.4 26.0 - 34.0 pg   MCHC 34.4 30.0 - 36.0 g/dL   RDW 12.0 11.5 - 15.5 %   Platelets 241 150 - 400 K/uL  Urinalysis, Routine w reflex microscopic  Status: None   Collection Time: 04/26/16  4:20 PM  Result Value Ref Range   Color, Urine YELLOW YELLOW   APPearance CLEAR CLEAR   Specific Gravity, Urine 1.005 1.005 - 1.030   pH 7.5 5.0 - 8.0   Glucose, UA NEGATIVE NEGATIVE mg/dL   Hgb urine dipstick NEGATIVE NEGATIVE   Bilirubin Urine NEGATIVE NEGATIVE   Ketones, ur NEGATIVE NEGATIVE mg/dL   Protein, ur NEGATIVE NEGATIVE mg/dL   Nitrite NEGATIVE NEGATIVE   Leukocytes, UA NEGATIVE NEGATIVE    Comment: MICROSCOPIC NOT DONE ON URINES WITH NEGATIVE PROTEIN, BLOOD, LEUKOCYTES, NITRITE, OR GLUCOSE <1000 mg/dL.  CBC     Status: Abnormal   Collection Time: 04/27/16  4:48 AM  Result Value Ref Range   WBC 5.5 4.0 - 10.5 K/uL   RBC 3.71 (L) 3.87 - 5.11 MIL/uL   Hemoglobin 11.6 (L) 12.0 - 15.0 g/dL   HCT 34.3 (L) 36.0 - 46.0 %   MCV 92.5 78.0 - 100.0 fL   MCH 31.3 26.0 - 34.0 pg   MCHC 33.8 30.0 - 36.0 g/dL   RDW 12.0 11.5 - 15.5 %   Platelets 215 150 - 400 K/uL  Comprehensive metabolic panel     Status: Abnormal   Collection Time: 04/27/16  4:48 AM  Result Value Ref Range   Sodium 142 135 -  145 mmol/L   Potassium 3.8 3.5 - 5.1 mmol/L   Chloride 106 101 - 111 mmol/L   CO2 24 22 - 32 mmol/L   Glucose, Bld 80 65 - 99 mg/dL   BUN <5 (L) 6 - 20 mg/dL   Creatinine, Ser 0.92 0.44 - 1.00 mg/dL   Calcium 8.3 (L) 8.9 - 10.3 mg/dL   Total Protein 5.5 (L) 6.5 - 8.1 g/dL   Albumin 2.8 (L) 3.5 - 5.0 g/dL   AST 15 15 - 41 U/L   ALT 18 14 - 54 U/L   Alkaline Phosphatase 38 38 - 126 U/L   Total Bilirubin 0.4 0.3 - 1.2 mg/dL   GFR calc non Af Amer >60 >60 mL/min   GFR calc Af Amer >60 >60 mL/min    Comment: (NOTE) The eGFR has been calculated using the CKD EPI equation. This calculation has not been validated in all clinical situations. eGFR's persistently <60 mL/min signify possible Chronic Kidney Disease.    Anion gap 12 5 - 15  CBC with Differential/Platelet     Status: Abnormal   Collection Time: 04/27/16  6:16 PM  Result Value Ref Range   WBC 5.0 4.0 - 10.5 K/uL   RBC 3.67 (L) 3.87 - 5.11 MIL/uL   Hemoglobin 11.2 (L) 12.0 - 15.0 g/dL   HCT 33.5 (L) 36.0 - 46.0 %   MCV 91.3 78.0 - 100.0 fL   MCH 30.5 26.0 - 34.0 pg   MCHC 33.4 30.0 - 36.0 g/dL   RDW 12.0 11.5 - 15.5 %   Platelets 217 150 - 400 K/uL   Neutrophils Relative % 61 %   Neutro Abs 3.0 1.7 - 7.7 K/uL   Lymphocytes Relative 28 %   Lymphs Abs 1.4 0.7 - 4.0 K/uL   Monocytes Relative 8 %   Monocytes Absolute 0.4 0.1 - 1.0 K/uL   Eosinophils Relative 3 %   Eosinophils Absolute 0.2 0.0 - 0.7 K/uL   Basophils Relative 0 %   Basophils Absolute 0.0 0.0 - 0.1 K/uL  Basic metabolic panel     Status: Abnormal   Collection Time: 04/28/16  4:09  AM  Result Value Ref Range   Sodium 146 (H) 135 - 145 mmol/L   Potassium 3.5 3.5 - 5.1 mmol/L   Chloride 109 101 - 111 mmol/L   CO2 23 22 - 32 mmol/L   Glucose, Bld 108 (H) 65 - 99 mg/dL   BUN <5 (L) 6 - 20 mg/dL   Creatinine, Ser 0.82 0.44 - 1.00 mg/dL   Calcium 9.0 8.9 - 10.3 mg/dL   GFR calc non Af Amer >60 >60 mL/min   GFR calc Af Amer >60 >60 mL/min    Comment:  (NOTE) The eGFR has been calculated using the CKD EPI equation. This calculation has not been validated in all clinical situations. eGFR's persistently <60 mL/min signify possible Chronic Kidney Disease.    Anion gap 14 5 - 15    Imaging / Studies: Ct Abdomen Pelvis W Contrast  04/26/2016  CLINICAL DATA:  51 year old female with history of diverticulitis presenting with left lower quadrant abdominal pain, nausea. EXAM: CT ABDOMEN AND PELVIS WITH CONTRAST TECHNIQUE: Multidetector CT imaging of the abdomen and pelvis was performed using the standard protocol following bolus administration of intravenous contrast. CONTRAST:  150m ISOVUE-300 IOPAMIDOL (ISOVUE-300) INJECTION 61% COMPARISON:  CT dated 04/19/2016 FINDINGS: The visualized lung bases are clear. No intra-abdominal free air or free fluid. The the liver, gallbladder, pancreas, spleen, adrenal glands, kidneys, visualized ureters, and urinary bladder appear unremarkable. Hysterectomy. Large amount of dense noted throughout the colon. There are scattered sigmoid diverticula. There is active inflammatory changes of a diverticulum in the sigmoid colon in the similar location as the prior study most compatible with acute diverticulitis. There is a 1.7 x 2.0 cm low attenuating collection adjacent to the inflamed diverticula which may be fluid within the diverticular lumen or represent developing diverticular abscess. Clinical correlation and follow-up recommended. There is associated focal thickening of the anterior wall of the sigmoid colon, likely related to inflammatory process. Underlying mass is less likely but not entirely excluded. Further evaluation with colonoscopy after resolution of acute inflammatory process recommended. There is no evidence of bowel obstruction. Normal appendix. The abdominal aorta and IVC appear unremarkable. No portal venous gas identified. There is no adenopathy. There is a small fat containing umbilical hernia. The  abdominal wall soft tissues are otherwise unremarkable. The osseous structures are intact. IMPRESSION: Acute diverticulitis of the sigmoid colon with possible developing diverticular abscess. Electronically Signed   By: AAnner CreteM.D.   On: 04/26/2016 18:35    Medications / Allergies:  Scheduled Meds: . heparin subcutaneous  5,000 Units Subcutaneous Q8H  . piperacillin-tazobactam  3.375 g Intravenous Q6H  . risperiDONE  9 mg Oral QHS  . traZODone  300 mg Oral QHS   Continuous Infusions: . dextrose 5% lactated ringers 100 mL/hr at 04/28/16 0525   PRN Meds:.acetaminophen **OR** acetaminophen, ALPRAZolam, methocarbamol (ROBAXIN)  IV, morphine injection, ondansetron **OR** ondansetron (ZOFRAN) IV, oxyCODONE, zolpidem  Antibiotics: Anti-infectives    Start     Dose/Rate Route Frequency Ordered Stop   04/27/16 1630  piperacillin-tazobactam (ZOSYN) IVPB 3.375 g     3.375 g 100 mL/hr over 30 Minutes Intravenous Every 6 hours 04/27/16 1539     04/27/16 0400  metroNIDAZOLE (FLAGYL) IVPB 500 mg  Status:  Discontinued     500 mg 100 mL/hr over 60 Minutes Intravenous Every 8 hours 04/26/16 2307 04/27/16 1539   04/27/16 0000  cefTRIAXone (ROCEPHIN) 2 g in dextrose 5 % 50 mL IVPB  Status:  Discontinued  2 g 100 mL/hr over 30 Minutes Intravenous Every 24 hours 04/26/16 2308 04/27/16 1539   04/26/16 2300  metroNIDAZOLE (FLAGYL) IVPB 500 mg  Status:  Discontinued     500 mg 100 mL/hr over 60 Minutes Intravenous Every 8 hours 04/26/16 2252 04/26/16 2307   04/26/16 2000  ciprofloxacin (CIPRO) IVPB 400 mg     400 mg 200 mL/hr over 60 Minutes Intravenous  Once 04/26/16 1955 04/26/16 2043   04/26/16 2000  metroNIDAZOLE (FLAGYL) IVPB 500 mg     500 mg 100 mL/hr over 60 Minutes Intravenous  Once 04/26/16 1955 04/26/16 2140        Assessment/Plan Acute sigmoid diverticulitis with abscess-allow for clears, CBC in AM, continue antibiotics, mobilize.  Hopefully with resolve with non op  management.  OP colonoscopy 6-8 weeks.    Erby Pian, [facility] Surgery Pager [phone]) For consults and floor pages call [phone])  04/28/2016 9:30 AM

## 2016-04-28 NOTE — Progress Notes (Signed)
[facility] Progress Note                                            Patient Demographics:    Deborah Pena, is a 50 y.o. female, DOB - 12-10-1966, MH:[id number]  Admit date - 04/26/2016   Admitting Physician Edwin Dada, MD  Outpatient Primary MD for the patient is [facility]  LOS - 2  Outpatient Specialists  Chief Complaint  Patient presents with  . Abdominal Pain        Subjective:    Deborah Pena Persistent abdominal pain, but improved, colic to dull in nature, worse to touch and improved with pain meds, no nausea or vomiting and tolerating clears well. Persistent dry eye on the right.   Assessment  & Plan :    Principal Problem:   Acute diverticulitis Active Problems:   Diverticulitis of large intestine with abscess without bleeding   1.Cardiovascular.  Will continue hydration with IV fluids, no signs of volume overload, blood pressure systolic at [id number], wbc at 5.5  2. Pulmonary:  Will continue to monitor oxymetry, no signs of volume overload.   3. Nephrology.  Stable cr at 0.82 with K at 3,5 will continue to follow renal panel in am, will replete k with kcl. PO. Noted hypernatremia will change IV fluids to d5 1/2 saline at 75 cc/hr.  4. Gastroenterology. Will continue supportive care with iV zosyn and IV fluids, pain control with opiates IV, follow on surgery recommendations.   5. Neurology will continue on trazodone, risperidone and zolpidem.   Code Status :   Family Communication  :  Disposition Plan  :   Barriers For Discharge :   Consults  :  surgery  Procedures  :   DVT Prophylaxis  :   -  Heparin   Lab Results  Component Value Date   PLT 217 04/27/2016    Antibiotics  :  zosyn  Anti-infectives    Start     Dose/Rate Route Frequency Ordered Stop   04/27/16 1630  piperacillin-tazobactam (ZOSYN) IVPB 3.375 g     3.375 g 100 mL/hr over 30 Minutes Intravenous Every 6 hours 04/27/16 1539     04/27/16 0400  metroNIDAZOLE (FLAGYL) IVPB 500 mg  Status:  Discontinued     500 mg 100 mL/hr over 60 Minutes Intravenous Every 8 hours 04/26/16 2307 04/27/16 1539   04/27/16 0000  cefTRIAXone (ROCEPHIN) 2 g in dextrose 5 % 50 mL IVPB  Status:  Discontinued     2 g 100 mL/hr over 30 Minutes Intravenous Every 24 hours 04/26/16 2308 04/27/16 1539   04/26/16 2300  metroNIDAZOLE (FLAGYL) IVPB 500 mg  Status:  Discontinued     500 mg 100 mL/hr over 60 Minutes Intravenous Every 8 hours 04/26/16 2252 04/26/16 2307   04/26/16 2000  ciprofloxacin (CIPRO) IVPB 400 mg     400 mg 200 mL/hr over 60 Minutes Intravenous  Once 04/26/16 1955 04/26/16 2043   04/26/16 2000  metroNIDAZOLE (FLAGYL) IVPB 500 mg     500 mg 100 mL/hr over 60 Minutes Intravenous  Once 04/26/16 1955 04/26/16 2140        Objective:   Filed Vitals:   04/27/16 1645 04/27/16 2100 04/28/16 0517 04/28/16 1332  BP: 113/69 118/60 108/59 126/77  Pulse: 70 75 76 84  Temp: 97.8 F (36.6 C) 98.2 F (36.8 C) 98.8 F (37.1 C) 98.7 F (37.1 C)  TempSrc: Oral Oral  Oral  Resp: 18 18 17 18   Height:      Weight:      SpO2: 96% 100% 97% 98%    Wt Readings from Last 3 Encounters:  04/26/16 93.94 kg (207 lb 1.6 oz)  04/29/13 95.255 kg (210 lb)  08/24/12 106.958 kg (235 lb 12.8 oz)     Intake/Output Summary (Last 24 hours) at 04/28/16 1456 Last data filed at 04/28/16 1300  Gross per 24 hour  Intake   2762 ml  Output   4650 ml  Net  -1888 ml     Physical Exam  Awake Alert, Oriented X 3, No new F.N deficits, Normal affect, deconditioned. [place].AT,PERRAL Supple Neck,No JVD, No cervical lymphadenopathy appriciated.    Symmetrical Chest wall movement, Good air movement bilaterally, CTAB RRR,No Gallops,Rubs or new Murmurs, No Parasternal Heave +ve B.Sounds, mild distended, tender to deep palpation, no rebound or organomegaly No Cyanosis, Clubbing or edema, No new Rash or bruise      Data Review:    CBC  Recent Labs Lab 04/26/16 1514 04/27/16 0448 04/27/16 1816  WBC 6.4 5.5 5.0  HGB 12.6 11.6* 11.2*  HCT 36.6 34.3* 33.5*  PLT 241 215 217  MCV 91.3 92.5 91.3  MCH 31.4 31.3 30.5  MCHC 34.4 33.8 33.4  RDW 12.0 12.0 12.0  LYMPHSABS  --   --  1.4  MONOABS  --   --  0.4  EOSABS  --   --  0.2  BASOSABS  --   --  0.0    Chemistries   Recent Labs Lab 04/26/16 1514 04/27/16 0448 04/28/16 0409  NA 136 142 146*  K 3.7 3.8 3.5  CL 105 106 109  CO2 27 24 23   GLUCOSE 97 80 108*  BUN 7 <5* <5*  CREATININE 0.98 0.92 0.82  CALCIUM 8.7* 8.3* 9.0  AST 19 15  --   ALT 20 18  --   ALKPHOS 47 38  --   BILITOT 0.3 0.4  --    ------------------------------------------------------------------------------------------------------------------ No results for input(s): CHOL, HDL, LDLCALC, TRIG, CHOLHDL, LDLDIRECT in the last 72 hours.  Lab Results  Component Value Date   HGBA1C 5.1 08/21/2012   ------------------------------------------------------------------------------------------------------------------ No results for input(s): TSH, T4TOTAL, T3FREE, THYROIDAB in the last 72 hours.  Invalid input(s): FREET3 ------------------------------------------------------------------------------------------------------------------ No results for input(s): VITAMINB12, FOLATE, FERRITIN, TIBC, IRON, RETICCTPCT in the last 72 hours.  Coagulation profile No results for input(s): INR, PROTIME in the last 168 hours.  No results for input(s): DDIMER in the last 72 hours.  Cardiac Enzymes No results for input(s): CKMB, TROPONINI, MYOGLOBIN in the last 168 hours.  Invalid input(s):  CK ------------------------------------------------------------------------------------------------------------------ No results found for: BNP  Inpatient Medications  Scheduled  Meds: . heparin subcutaneous  5,000 Units Subcutaneous Q8H  . hydroxypropyl methylcellulose / hypromellose  2 drop Both Eyes QID  . piperacillin-tazobactam  3.375 g Intravenous Q6H  . risperiDONE  9 mg Oral QHS  . traZODone  300 mg Oral QHS   Continuous Infusions: . dextrose 5% lactated ringers 100 mL/hr at 04/28/16 0525   PRN Meds:.acetaminophen **OR** acetaminophen, ALPRAZolam, methocarbamol (ROBAXIN)  IV, morphine injection, ondansetron **OR** ondansetron (ZOFRAN) IV, oxyCODONE, zolpidem  Micro Results No results found for this or any previous visit (from the past 240 hour(s)).  Radiology Reports Dg Chest 2 View  04/19/2016  CLINICAL DATA:  Left-sided chest pain and tachycardia along with night sweats for 2 weeks. EXAM: CHEST  2 VIEW COMPARISON:  Tonight 1,010 FINDINGS: The cardiac silhouette, mediastinal and hilar contours are normal. The lungs are clear. No pleural effusion. The bony thorax is intact. IMPRESSION: No acute cardiopulmonary findings.  Is Electronically Signed   By: Marijo Sanes M.D.   On: 04/19/2016 16:24   Ct Abdomen Pelvis W Contrast  04/26/2016  CLINICAL DATA:  50 year old female with history of diverticulitis presenting with left lower quadrant abdominal pain, nausea. EXAM: CT ABDOMEN AND PELVIS WITH CONTRAST TECHNIQUE: Multidetector CT imaging of the abdomen and pelvis was performed using the standard protocol following bolus administration of intravenous contrast. CONTRAST:  126mL ISOVUE-300 IOPAMIDOL (ISOVUE-300) INJECTION 61% COMPARISON:  CT dated 04/19/2016 FINDINGS: The visualized lung bases are clear. No intra-abdominal free air or free fluid. The the liver, gallbladder, pancreas, spleen, adrenal glands, kidneys, visualized ureters, and urinary bladder appear unremarkable. Hysterectomy.  Large amount of dense noted throughout the colon. There are scattered sigmoid diverticula. There is active inflammatory changes of a diverticulum in the sigmoid colon in the similar location as the prior study most compatible with acute diverticulitis. There is a 1.7 x 2.0 cm low attenuating collection adjacent to the inflamed diverticula which may be fluid within the diverticular lumen or represent developing diverticular abscess. Clinical correlation and follow-up recommended. There is associated focal thickening of the anterior wall of the sigmoid colon, likely related to inflammatory process. Underlying mass is less likely but not entirely excluded. Further evaluation with colonoscopy after resolution of acute inflammatory process recommended. There is no evidence of bowel obstruction. Normal appendix. The abdominal aorta and IVC appear unremarkable. No portal venous gas identified. There is no adenopathy. There is a small fat containing umbilical hernia. The abdominal wall soft tissues are otherwise unremarkable. The osseous structures are intact. IMPRESSION: Acute diverticulitis of the sigmoid colon with possible developing diverticular abscess. Electronically Signed   By: Anner Crete M.D.   On: 04/26/2016 18:35   Ct Abdomen Pelvis W Contrast  04/19/2016  CLINICAL DATA:  Abdominal pain.  History of diverticulitis. EXAM: CT ABDOMEN AND PELVIS WITH CONTRAST TECHNIQUE: Multidetector CT imaging of the abdomen and pelvis was performed using the standard protocol following bolus administration of intravenous contrast. CONTRAST:  124mL ISOVUE-300 IOPAMIDOL (ISOVUE-300) INJECTION 61% COMPARISON:  CT abdomen pelvis - 02/16/2015 FINDINGS: Lower chest: Limited visualization of the lower thorax demonstrates minimal dependent subpleural ground-glass atelectasis, left greater than right. No focal airspace opacities. No pleural effusion. Normal heart size.  No pericardial effusion. Hepatobiliary: Normal hepatic  contour. There is a minimal amount of focal fatty infiltration adjacent to the fissure for ligamentum teres. No discrete hepatic lesions. Normal appearance of the gallbladder given degree distention. No radiopaque gallstones. No intra or extrahepatic biliary ductal dilatation. No ascites. Pancreas: Normal in appearance Spleen: Normal  in appearance.  Note is made of a small splenule. Adrenals/Urinary Tract: There is symmetric enhancement and excretion of the bilateral kidneys. No definite renal stones this postcontrast examination. Punctate (approximately 0.6 cm) hypo attenuating lesion within in the left kidney is too small to adequately characterize of favored to represent renal cysts. No discrete left-sided renal lesions. No urinary obstruction a perinephric stranding. Normal appearance of the bilateral adrenal glands. Normal appearance of the urinary bladder given degree distention. Stomach/Bowel: Ingested enteric contrast extends to the level of the rectum. Rather extensive colonic diverticulosis with ill-defined stranding about the anterior cranial aspect of the sigmoid colon within the midline of the lower abdomen/upper pelvis (image 75, series 2), compatible with acute uncomplicated diverticulitis. No evidence of perforation or definable/drainable fluid collection. The bowel is otherwise normal in course and caliber without wall thickening. Moderate colonic stool burden without evidence of enteric obstruction. Normal appearance of the terminal ileum. The appendix is not visualized, however there is no pericecal inflammatory change. Vascular/Lymphatic: Normal caliber of the abdominal aorta. The major branch vessels of the abdominal aorta appear widely patent on this non CTA examination. No bulky retroperitoneal, mesenteric, pelvic or inguinal lymphadenopathy. Reproductive: Post hysterectomy. No discrete adnexal lesion. No free fluid in the pelvic cul-de-sac. Other: Tiny mesenteric fat containing periumbilical  hernia. Regional soft tissues appear otherwise normal. Musculoskeletal: No acute or aggressive osseous abnormalities. IMPRESSION: Acute uncomplicated diverticulitis involving the anterior cranial aspect of the sigmoid colon within the midline of the lower abdomen / pelvis without evidence of perforation or definable / fluid collection. This area of acute diverticulitis appears slightly downstream from prior episode seen on the 12/17/2015 examination though if not recently performed, further evaluation with colonoscopy after the resolution of acute symptoms is recommended. Electronically Signed   By: Sandi Mariscal M.D.   On: 04/19/2016 21:00    Time Spent in minutes    Jimmy Picket Socorro Kanitz M.D on 04/28/2016 at 2:56 PM  Between 7am to 7pm - Pager   After 7pm go to [email or web address] - password [facility]  [facility] -  Office  [phone]

## 2016-04-28 NOTE — Progress Notes (Signed)
Offered Pt a bath, Pt stated she was not taking a bath today. Tech asked Pt to contact tech if she changed her mind.

## 2016-04-29 LAB — CBC WITH DIFFERENTIAL/PLATELET
BASOS PCT: 0 %
Basophils Absolute: 0 10*3/uL (ref 0.0–0.1)
EOS ABS: 0.1 10*3/uL (ref 0.0–0.7)
EOS PCT: 2 %
HCT: 35.9 % — ABNORMAL LOW (ref 36.0–46.0)
HEMOGLOBIN: 12.2 g/dL (ref 12.0–15.0)
LYMPHS ABS: 1.3 10*3/uL (ref 0.7–4.0)
Lymphocytes Relative: 30 %
MCH: 30.7 pg (ref 26.0–34.0)
MCHC: 34 g/dL (ref 30.0–36.0)
MCV: 90.4 fL (ref 78.0–100.0)
MONOS PCT: 10 %
Monocytes Absolute: 0.4 10*3/uL (ref 0.1–1.0)
NEUTROS PCT: 58 %
Neutro Abs: 2.6 10*3/uL (ref 1.7–7.7)
Platelets: 249 10*3/uL (ref 150–400)
RBC: 3.97 MIL/uL (ref 3.87–5.11)
RDW: 12 % (ref 11.5–15.5)
WBC: 4.4 10*3/uL (ref 4.0–10.5)

## 2016-04-29 LAB — BASIC METABOLIC PANEL
Anion gap: 12 (ref 5–15)
CALCIUM: 9.2 mg/dL (ref 8.9–10.3)
CHLORIDE: 108 mmol/L (ref 101–111)
CO2: 25 mmol/L (ref 22–32)
CREATININE: 0.97 mg/dL (ref 0.44–1.00)
GFR calc Af Amer: >60 mL/min (ref >60)
GFR calc non Af Amer: >60 mL/min (ref >60)
GLUCOSE: 119 mg/dL — AB (ref 65–99)
Potassium: 3.7 mmol/L (ref 3.5–5.1)
Sodium: 145 mmol/L (ref 135–145)

## 2016-04-29 MED ORDER — POTASSIUM CHLORIDE 20 MEQ PO PACK
40.0000 meq | PACK | Freq: Once | ORAL | Status: AC
  Administered 2016-04-29: 40 meq via ORAL
  Filled 2016-04-29: qty 2

## 2016-04-29 MED ORDER — TOBRAMYCIN 0.3 % OP SOLN
1.0000 [drp] | OPHTHALMIC | Status: DC
Start: 2016-04-29 — End: 2016-04-30
  Administered 2016-04-29 – 2016-04-30 (×4): 1 [drp] via OPHTHALMIC
  Filled 2016-04-29: qty 5

## 2016-04-29 NOTE — Progress Notes (Signed)
Pt. Also refused a bath

## 2016-04-29 NOTE — Progress Notes (Signed)
  Subjective: 50 yo F with sigmoid diverticulitis with assoc abscess.  Diet decreased yesterday secondary to nausea with trial of clears.  States that she is doing much better since that time.  Passing flatus and pain and nausea have resolved.  Afebrile overnight.  Objective: Vital signs in last 24 hours: Temp:  [97.6 F (36.4 C)-98.7 F (37.1 C)] 98 F (36.7 C) (05/11 0612) Pulse Rate:  [78-84] 78 (05/11 0612) Resp:  [15-18] 15 (05/11 0612) BP: (120-126)/(70-77) 120/70 mmHg (05/11 0612) SpO2:  [98 %] 98 % (05/11 0612) Last BM Date: 04/28/16  Intake/Output from previous day: 05/10 0701 - 05/11 0700 In: 2265.8 [P.O.:1072; I.V.:1043.8; IV Piggyback:150] Out: 2550 [Urine:2550] Intake/Output this shift:    General appearance: alert, cooperative, appears stated age, no distress and mildly obese Resp: Nonlabored respirations.  Bilateral chest wall movement. GI: soft, non-tender; bowel sounds normal; no masses,  no organomegaly  Lab Results:   Recent Labs  04/27/16 1816 04/29/16 0810  WBC 5.0 4.4  HGB 11.2* 12.2  HCT 33.5* 35.9*  PLT 217 249   BMET  Recent Labs  04/27/16 0448 04/28/16 0409  NA 142 146*  K 3.8 3.5  CL 106 109  CO2 24 23  GLUCOSE 80 108*  BUN <5* <5*  CREATININE 0.92 0.82  CALCIUM 8.3* 9.0   PT/INR No results for input(s): LABPROT, INR in the last 72 hours. ABG No results for input(s): PHART, HCO3 in the last 72 hours.  Invalid input(s): PCO2, PO2  Studies/Results: No results found.  Anti-infectives: Anti-infectives    Start     Dose/Rate Route Frequency Ordered Stop   04/27/16 1630  piperacillin-tazobactam (ZOSYN) IVPB 3.375 g     3.375 g 100 mL/hr over 30 Minutes Intravenous Every 6 hours 04/27/16 1539     04/27/16 0400  metroNIDAZOLE (FLAGYL) IVPB 500 mg  Status:  Discontinued     500 mg 100 mL/hr over 60 Minutes Intravenous Every 8 hours 04/26/16 2307 04/27/16 1539   04/27/16 0000  cefTRIAXone (ROCEPHIN) 2 g in dextrose 5 % 50 mL  IVPB  Status:  Discontinued     2 g 100 mL/hr over 30 Minutes Intravenous Every 24 hours 04/26/16 2308 04/27/16 1539   04/26/16 2300  metroNIDAZOLE (FLAGYL) IVPB 500 mg  Status:  Discontinued     500 mg 100 mL/hr over 60 Minutes Intravenous Every 8 hours 04/26/16 2252 04/26/16 2307   04/26/16 2000  ciprofloxacin (CIPRO) IVPB 400 mg     400 mg 200 mL/hr over 60 Minutes Intravenous  Once 04/26/16 1955 04/26/16 2043   04/26/16 2000  metroNIDAZOLE (FLAGYL) IVPB 500 mg     500 mg 100 mL/hr over 60 Minutes Intravenous  Once 04/26/16 1955 04/29/16 0208      Assessment/Plan: s/p * No surgery found * Acute sigmoid diverticulitis with abscess- Will start trial of clears this morning.  If tolerated, can advance to regular diet for dinner.  Plan for repeat  CBC in AM, continue antibiotics (if tolerates PO could transition to oral abx), mobilize. Hopefully with resolve with non op management. OP colonoscopy 6-8 weeks.    LOS: 3 days    Reino Kent 04/29/2016

## 2016-04-29 NOTE — Progress Notes (Signed)
Offer Pt. A bath but pt. Refused she stated she will be taking a wash up tomorrow. I set her up earlier today but she changed her mind

## 2016-04-29 NOTE — Progress Notes (Signed)
PROGRESS NOTE    Deborah Pena  [id number] DOB: [birth year] DOA: 04/26/2016 PCP: [facility] (Confirm with patient/family/NH records and if not entered, this HAS to be entered at [facility] point of entry. "No PCP" if truly none.) Outpatient Specialists: [employer] park manager speciality and name if known)    Brief Narrative: (Start on day 1 of progress note - keep it brief and live)    Assessment & Plan:   Principal Problem:   Acute diverticulitis Active Problems:   Diverticulitis of large intestine with abscess without bleeding  1.Cardiovascular. Improved abdominal pain, will continue with antibiotic therapy ZOsyn, possible change to augmentin and metronidazole at discharge.   2. Pulmonary: Will continue to monitor oxymetry, no signs of volume overload.   3. Nephrology. Stable cr at 0.97 with K at 3,7 will continue to follow renal panel in am, will replete k with kcl. PO.  Na at 145 from 146, will continue IV fluids with d51/2 saline.  4. Gastroenterology. Pain improved will continue supportive care, follow surgery recommendations. Possible advance diet today, so far patient has been npo.  5. Neurology will continue on trazodone, risperidone and zolpidem.    DVT prophylaxis: (Lovenox/Heparin/SCD's/anticoagulated/None (if comfort care) Code Status: (Full/Partial - specify details) Family Communication: (Specify name, relationship & date discussed. NO "discussed with patient") Disposition Plan: (specify when and where you expect patient to be discharged)   Consultants:   Procedures: (Don't include imaging studies which can be auto populated. Include things that cannot be auto populated i.e. Echo, Carotid and venous dopplers, Foley, Bipap, HD, tubes/drains, wound vac, central lines etc)    Antimicrobials: (specify start and planned stop date. Auto populated tables are space occupying and do not give end dates)     Subjective:  Patient  has been npo with improvement of her abdominal pain, no nausea or vomiting. Persistent left eye discharge. No sob, or chest pain. No fever or chills. Abdominal pain has improved.   Objective: Filed Vitals:   04/28/16 0517 04/28/16 1332 04/28/16 1505 04/29/16 0612  BP: 108/59 126/77  120/70  Pulse: 76 84  78  Temp: 98.8 F (37.1 C) 98.7 F (37.1 C) 97.6 F (36.4 C) 98 F (36.7 C)  TempSrc:  Oral Axillary Oral  Resp: 17 18  15   Height:      Weight:      SpO2: 97% 98%  98%    Intake/Output Summary (Last 24 hours) at 04/29/16 1110 Last data filed at 04/29/16 0916  Gross per 24 hour  Intake 1611.75 ml  Output   2150 ml  Net -538.25 ml   Filed Weights   04/26/16 1458 04/26/16 2136  Weight: 95.255 kg (210 lb) 93.94 kg (207 lb 1.6 oz)    Examination:  General exam: No in pain. ENT: oral mucosa moist, no conjunctival pallor. Respiratory system: Vesicular breath sounds bilaterally. Respiratory effort normal. Cardiovascular system: S1 & S2 heard, RRR. No JVD, murmurs, rubs, gallops or clicks. No pedal edema. Gastrointestinal system: Abdomen is nondistended, soft and nontender. No organomegaly or masses felt. Normal bowel sounds heard. Central nervous system: Alert and oriented. No focal neurological deficits. Extremities: Symmetric 5 x 5 power. Skin: No rashes, lesions or ulcers Psychiatry: Judgement and insight appear normal. Mood & affect appropriate.     Data Reviewed: I have personally reviewed following labs and imaging studies  CBC:  Recent Labs Lab 04/26/16 1514 04/27/16 0448 04/27/16 1816 04/29/16 0810  WBC 6.4 5.5 5.0 4.4  NEUTROABS  --   --  3.0 2.6  HGB 12.6 11.6* 11.2* 12.2  HCT 36.6 34.3* 33.5* 35.9*  MCV 91.3 92.5 91.3 90.4  PLT 241 215 217 0000000   Basic Metabolic Panel:  Recent Labs Lab 04/26/16 1514 04/27/16 0448 04/28/16 0409 04/29/16 0810  NA 136 142 146* 145  K 3.7 3.8 3.5 3.7  CL 105 106 109 108  CO2 27 24 23 25   GLUCOSE 97 80 108* 119*   BUN 7 <5* <5* <5*  CREATININE 0.98 0.92 0.82 0.97  CALCIUM 8.7* 8.3* 9.0 9.2   GFR: Estimated Creatinine Clearance: 77.3 mL/min (by C-G formula based on Cr of 0.97). Liver Function Tests:  Recent Labs Lab 04/26/16 1514 04/27/16 0448  AST 19 15  ALT 20 18  ALKPHOS 47 38  BILITOT 0.3 0.4  PROT 6.9 5.5*  ALBUMIN 3.9 2.8*    Recent Labs Lab 04/26/16 1514  LIPASE 24   No results for input(s): AMMONIA in the last 168 hours. Coagulation Profile: No results for input(s): INR, PROTIME in the last 168 hours. Cardiac Enzymes: No results for input(s): CKTOTAL, CKMB, CKMBINDEX, TROPONINI in the last 168 hours. BNP (last 3 results) No results for input(s): PROBNP in the last 8760 hours. HbA1C: No results for input(s): HGBA1C in the last 72 hours. CBG: No results for input(s): GLUCAP in the last 168 hours. Lipid Profile: No results for input(s): CHOL, HDL, LDLCALC, TRIG, CHOLHDL, LDLDIRECT in the last 72 hours. Thyroid Function Tests: No results for input(s): TSH, T4TOTAL, FREET4, T3FREE, THYROIDAB in the last 72 hours. Anemia Panel: No results for input(s): VITAMINB12, FOLATE, FERRITIN, TIBC, IRON, RETICCTPCT in the last 72 hours. Urine analysis:    Component Value Date/Time   COLORURINE YELLOW 04/26/2016 1620   APPEARANCEUR CLEAR 04/26/2016 1620   LABSPEC 1.005 04/26/2016 1620   PHURINE 7.5 04/26/2016 1620   GLUCOSEU NEGATIVE 04/26/2016 1620   HGBUR NEGATIVE 04/26/2016 1620   BILIRUBINUR NEGATIVE 04/26/2016 1620   BILIRUBINUR neg 08/21/2012 0948   KETONESUR NEGATIVE 04/26/2016 1620   PROTEINUR NEGATIVE 04/26/2016 1620   PROTEINUR neg 08/21/2012 0948   UROBILINOGEN 0.2 08/21/2012 1138   UROBILINOGEN 0.2 08/21/2012 0948   NITRITE NEGATIVE 04/26/2016 1620   NITRITE neg 08/21/2012 0948   LEUKOCYTESUR NEGATIVE 04/26/2016 1620   Sepsis Labs: No results for input(s): PROCALCITON, LATICACIDVEN in the last 168 hours.  No results found for this or any previous visit (from  the past 240 hour(s)).       Radiology Studies: No results found.      Scheduled Meds: . heparin subcutaneous  5,000 Units Subcutaneous Q8H  . hydroxypropyl methylcellulose / hypromellose  2 drop Both Eyes QID  . piperacillin-tazobactam  3.375 g Intravenous Q6H  . risperiDONE  9 mg Oral QHS  . traZODone  300 mg Oral QHS   Continuous Infusions: . dextrose 5 % and 0.45% NaCl 75 mL/hr at 04/29/16 0207     LOS: 3 days    Time spent:    Tawni Millers, MD [facility] Pager [phone]  If 7PM-7AM, please contact night-coverage [email or web address] Password [email or web address] 04/29/2016, 11:10 AM

## 2016-04-29 NOTE — Progress Notes (Signed)
Pt had a clear liquid diet for lunch then reported feeling nauseous and requested some zofran. Med administered as ordered

## 2016-04-30 LAB — CBC WITH DIFFERENTIAL/PLATELET
BASOS PCT: 0 %
Basophils Absolute: 0 10*3/uL (ref 0.0–0.1)
EOS ABS: 0.1 10*3/uL (ref 0.0–0.7)
EOS PCT: 3 %
HEMATOCRIT: 35.1 % — AB (ref 36.0–46.0)
Hemoglobin: 11.8 g/dL — ABNORMAL LOW (ref 12.0–15.0)
Lymphocytes Relative: 40 %
Lymphs Abs: 1.6 10*3/uL (ref 0.7–4.0)
MCH: 30.6 pg (ref 26.0–34.0)
MCHC: 33.6 g/dL (ref 30.0–36.0)
MCV: 91.2 fL (ref 78.0–100.0)
MONO ABS: 0.4 10*3/uL (ref 0.1–1.0)
MONOS PCT: 10 %
Neutro Abs: 1.9 10*3/uL (ref 1.7–7.7)
Neutrophils Relative %: 47 %
Platelets: 278 10*3/uL (ref 150–400)
RBC: 3.85 MIL/uL — ABNORMAL LOW (ref 3.87–5.11)
RDW: 12.2 % (ref 11.5–15.5)
WBC: 3.9 10*3/uL — ABNORMAL LOW (ref 4.0–10.5)

## 2016-04-30 LAB — BASIC METABOLIC PANEL
Anion gap: 12 (ref 5–15)
BUN: <5 mg/dL — ABNORMAL LOW (ref 6–20)
CALCIUM: 9.1 mg/dL (ref 8.9–10.3)
CO2: 25 mmol/L (ref 22–32)
CREATININE: 1 mg/dL (ref 0.44–1.00)
Chloride: 108 mmol/L (ref 101–111)
GFR calc non Af Amer: >60 mL/min (ref >60)
Glucose, Bld: 106 mg/dL — ABNORMAL HIGH (ref 65–99)
Potassium: 3.3 mmol/L — ABNORMAL LOW (ref 3.5–5.1)
Sodium: 145 mmol/L (ref 135–145)

## 2016-04-30 MED ORDER — METRONIDAZOLE 500 MG PO TABS: 500.0000 mg | ORAL_TABLET | Freq: Three times a day (TID) | ORAL | Status: DC

## 2016-04-30 MED ORDER — AMOXICILLIN-POT CLAVULANATE 500-125 MG PO TABS: 1.0000 | ORAL_TABLET | Freq: Two times a day (BID) | ORAL | Status: DC

## 2016-04-30 NOTE — Discharge Summary (Signed)
Deborah Pena, is a 50 y.o. female  DOB [birth year]  MRN [id number].  Admission date:  04/26/2016  Admitting Physician  Edwin Dada, MD  Discharge Date:  04/30/2016   Primary MD  [facility]  Recommendations for primary care physician for things to follow:   Patient has been discharged home instructions follow-up with her primary care physician within 7 days, patient will be to schedule colonoscopy in the next 6-8 weeks. Patient will finish antibiotic therapy with Augmentin and metronidazole for next 11 days.   Admission Diagnosis  Acute diverticulitis [K57.92]   Discharge Diagnosis  Acute diverticulitis [K57.92]    Principal Problem:   Acute diverticulitis Active Problems:   Diverticulitis of large intestine with abscess without bleeding      Past Medical History  Diagnosis Date  . Diverticulitis of colon   . Asthma   . Anginal pain ([place])   . Anemia   . GERD (gastroesophageal reflux disease)   . Stomach ulcer   . Headache     'once or twice/week" (04/26/2016)  . Migraine     "twice/month" (04/26/2016)  . Arthritis     "left hand" (04/26/2016)  . Chronic lower back pain   . Anxiety     Past Surgical History  Procedure Laterality Date  . Bunionectomy Right 2013  . Tonsillectomy    . Breast surgery Bilateral ~ 2000    "dual lateral duct removal"   . Abdominal hysterectomy  2013    preformed at [facility] by Dr Jacqualyn Posey in April  . Cesarean section  1999  . Oophorectomy Right 2013       HPI  from the history and physical done on the day of admission:   This is a 50 year old female who presents to the hospital with the chief complaint of abdominal pain. Patient was seen about 10 days prior to admission for abdominal pain, diagnosed with diverticulitis, placed on antibiotic therapy, but she developed worsening symptoms consistent with  intermittent lower quadrant colic abdominal pain, worse with by po intake, associated subjective fevers and constipation. On initial physical examination patient heart rate was 71 bpm her blood pressure was 114/74 and she was afebrile. Her oral mucosa was moist, her lungs were clear to auscultation bilaterally, heart S1 is present with me, her abdomen was soft without rigidity, mild left lower quadrant tenderness to palpation with slight rebound but no rigidity or guarding, no ascites or distention. Her white cell count was 6.4 with a hemoglobin of 12.6, creatinine was 0.8 with a potassium 3.7 sodium 136. Her CT of the abdomen and pelvis showed acute diverticulitis of the sigmoid colon with possible developing diverticular abscess.  Patient was admitted to the hospital with the working diagnosis of abdominal pain due to diverticulitis complicated by a possible diverticular abscess     Hospital Course:   1. Cardiovascular. Patient was placed on IV fluids, broad-spectrum antibiotics with Zosyn, her white blood cell count remained stable around 6.4-3.9, patient responded well to the medical  therapy.  2. Pulmonary. Patient was placed on oxygen per nasal cannula as needed, patient had pulse oximetry performed. No signs of volume overload. Patient tolerated well IV fluids.  3. Nephrology. Patient's creatinine remained normal, potassium was repleted throughout her hospital stay. At this point patient is tolerating by mouth diet without difficulty.  4. Gastrointestinal. Patient was diagnosed with acute diverticulitis, patient was placed on supportive medical therapy. Surgery was consulted who recommended continue antibiotics and pain control. Initially patient was nothing by mouth and then transition to oral feedings, by that time of discharge her abdominal pain has resolved, she does not have any nausea or vomiting.  Discharge Condition: Stable  Follow UP  Follow-up Information    Follow up with  SUN,YUN, MD In 1 week.   Specialty:  Internal Medicine   Contact information:   [address] [address] [phone]        Consults obtained - Surgery  Diet and Activity recommendation: See Discharge Instructions below  Discharge Instructions    Patient has been discharged home with follow-up with primary care physician within 7 days continue antibiotic therapy for next 11 days. Patient will need an outpatient colonoscopy.  Discharge Instructions    Diet - low sodium heart healthy    Complete by:  As directed      Discharge instructions    Complete by:  As directed   Continue with antibiotic therapy, follow with primary care in 7 days.     Increase activity slowly    Complete by:  As directed              Discharge Medications       Medication List    STOP taking these medications        ondansetron 4 MG tablet  Commonly known as:  ZOFRAN     oxyCODONE-acetaminophen 5-325 MG tablet  Commonly known as:  ROXICET      TAKE these medications        ALPRAZolam 1 MG 24 hr tablet  Commonly known as:  XANAX XR  Take 1 mg by mouth daily.     amoxicillin-clavulanate 500-125 MG tablet  Commonly known as:  AUGMENTIN  Take 1 tablet (500 mg total) by mouth 2 (two) times daily.     bismuth subsalicylate [id number] suspension  Commonly known as:  PEPTO BISMOL  Take 30 mLs by mouth every 6 (six) hours as needed for indigestion or diarrhea or loose stools.     dexmethylphenidate 10 MG tablet  Commonly known as:  FOCALIN  Take 10-20 mg by mouth daily as needed (in between focalin XR 20mg  if needed for ADHD).     FOCALIN XR 40 MG Cp24  Generic drug:  Dexmethylphenidate HCl  Take 40 mg by mouth 2 (two) times daily.     dimenhyDRINATE 50 MG tablet  Commonly known as:  DRAMAMINE  Take 150 mg by mouth every 8 (eight) hours as needed for dizziness.     DULoxetine 60 MG capsule  Commonly known as:  CYMBALTA  Take 60 mg by mouth daily.      metroNIDAZOLE 500 MG tablet  Commonly known as:  FLAGYL  Take 1 tablet (500 mg total) by mouth 3 (three) times daily.     risperiDONE 3 MG tablet  Commonly known as:  RISPERDAL  Take 9 mg by mouth at bedtime.     traZODone 150 MG tablet  Commonly known as:  DESYREL  Take 300  mg by mouth at bedtime.     zolpidem 10 MG tablet  Commonly known as:  AMBIEN  Take 10 mg by mouth at bedtime as needed for sleep.        Major procedures and Radiology Reports - PLEASE review detailed and final reports for all details, in brief -      Dg Chest 2 View  04/19/2016  CLINICAL DATA:  Left-sided chest pain and tachycardia along with night sweats for 2 weeks. EXAM: CHEST  2 VIEW COMPARISON:  Tonight 1,010 FINDINGS: The cardiac silhouette, mediastinal and hilar contours are normal. The lungs are clear. No pleural effusion. The bony thorax is intact. IMPRESSION: No acute cardiopulmonary findings.  Is Electronically Signed   By: Marijo Sanes M.D.   On: 04/19/2016 16:24   Ct Abdomen Pelvis W Contrast  04/26/2016  CLINICAL DATA:  50 year old female with history of diverticulitis presenting with left lower quadrant abdominal pain, nausea. EXAM: CT ABDOMEN AND PELVIS WITH CONTRAST TECHNIQUE: Multidetector CT imaging of the abdomen and pelvis was performed using the standard protocol following bolus administration of intravenous contrast. CONTRAST:  137mL ISOVUE-300 IOPAMIDOL (ISOVUE-300) INJECTION 61% COMPARISON:  CT dated 04/19/2016 FINDINGS: The visualized lung bases are clear. No intra-abdominal free air or free fluid. The the liver, gallbladder, pancreas, spleen, adrenal glands, kidneys, visualized ureters, and urinary bladder appear unremarkable. Hysterectomy. Large amount of dense noted throughout the colon. There are scattered sigmoid diverticula. There is active inflammatory changes of a diverticulum in the sigmoid colon in the similar location as the prior study most compatible with acute diverticulitis.  There is a 1.7 x 2.0 cm low attenuating collection adjacent to the inflamed diverticula which may be fluid within the diverticular lumen or represent developing diverticular abscess. Clinical correlation and follow-up recommended. There is associated focal thickening of the anterior wall of the sigmoid colon, likely related to inflammatory process. Underlying mass is less likely but not entirely excluded. Further evaluation with colonoscopy after resolution of acute inflammatory process recommended. There is no evidence of bowel obstruction. Normal appendix. The abdominal aorta and IVC appear unremarkable. No portal venous gas identified. There is no adenopathy. There is a small fat containing umbilical hernia. The abdominal wall soft tissues are otherwise unremarkable. The osseous structures are intact. IMPRESSION: Acute diverticulitis of the sigmoid colon with possible developing diverticular abscess. Electronically Signed   By: Anner Crete M.D.   On: 04/26/2016 18:35   Ct Abdomen Pelvis W Contrast  04/19/2016  CLINICAL DATA:  Abdominal pain.  History of diverticulitis. EXAM: CT ABDOMEN AND PELVIS WITH CONTRAST TECHNIQUE: Multidetector CT imaging of the abdomen and pelvis was performed using the standard protocol following bolus administration of intravenous contrast. CONTRAST:  184mL ISOVUE-300 IOPAMIDOL (ISOVUE-300) INJECTION 61% COMPARISON:  CT abdomen pelvis - 02/16/2015 FINDINGS: Lower chest: Limited visualization of the lower thorax demonstrates minimal dependent subpleural ground-glass atelectasis, left greater than right. No focal airspace opacities. No pleural effusion. Normal heart size.  No pericardial effusion. Hepatobiliary: Normal hepatic contour. There is a minimal amount of focal fatty infiltration adjacent to the fissure for ligamentum teres. No discrete hepatic lesions. Normal appearance of the gallbladder given degree distention. No radiopaque gallstones. No intra or extrahepatic biliary  ductal dilatation. No ascites. Pancreas: Normal in appearance Spleen: Normal in appearance.  Note is made of a small splenule. Adrenals/Urinary Tract: There is symmetric enhancement and excretion of the bilateral kidneys. No definite renal stones this postcontrast examination. Punctate (approximately 0.6 cm) hypo attenuating lesion within in  the left kidney is too small to adequately characterize of favored to represent renal cysts. No discrete left-sided renal lesions. No urinary obstruction a perinephric stranding. Normal appearance of the bilateral adrenal glands. Normal appearance of the urinary bladder given degree distention. Stomach/Bowel: Ingested enteric contrast extends to the level of the rectum. Rather extensive colonic diverticulosis with ill-defined stranding about the anterior cranial aspect of the sigmoid colon within the midline of the lower abdomen/upper pelvis (image 75, series 2), compatible with acute uncomplicated diverticulitis. No evidence of perforation or definable/drainable fluid collection. The bowel is otherwise normal in course and caliber without wall thickening. Moderate colonic stool burden without evidence of enteric obstruction. Normal appearance of the terminal ileum. The appendix is not visualized, however there is no pericecal inflammatory change. Vascular/Lymphatic: Normal caliber of the abdominal aorta. The major branch vessels of the abdominal aorta appear widely patent on this non CTA examination. No bulky retroperitoneal, mesenteric, pelvic or inguinal lymphadenopathy. Reproductive: Post hysterectomy. No discrete adnexal lesion. No free fluid in the pelvic cul-de-sac. Other: Tiny mesenteric fat containing periumbilical hernia. Regional soft tissues appear otherwise normal. Musculoskeletal: No acute or aggressive osseous abnormalities. IMPRESSION: Acute uncomplicated diverticulitis involving the anterior cranial aspect of the sigmoid colon within the midline of the lower  abdomen / pelvis without evidence of perforation or definable / fluid collection. This area of acute diverticulitis appears slightly downstream from prior episode seen on the 12/17/2015 examination though if not recently performed, further evaluation with colonoscopy after the resolution of acute symptoms is recommended. Electronically Signed   By: Sandi Mariscal M.D.   On: 04/19/2016 21:00    Micro Results     No results found for this or any previous visit (from the past 240 hour(s)).     Today   Subjective    Deborah Pena is feeling better, no abdominal pain, no nausea or vomiting. Patient has been tolerating by mouth diet adequately.    Objective   Blood pressure 102/67, pulse 67, temperature 98.2 F (36.8 C), temperature source Oral, resp. rate 16, height 5\' 3"  (1.6 m), weight 93.94 kg (207 lb 1.6 oz), SpO2 97 %.   Intake/Output Summary (Last 24 hours) at 04/30/16 1326 Last data filed at 04/30/16 0942  Gross per 24 hour  Intake    240 ml  Output   1350 ml  Net  -1110 ml    Exam Awake Alert, Oriented x 3, No new F.N deficits, Normal affect [place].AT,PERRAL Supple Neck,No JVD, No cervical lymphadenopathy appriciated.  Symmetrical Chest wall movement, Good air movement bilaterally, CTAB RRR,No Gallops,Rubs or new Murmurs, No Parasternal Heave +ve B.Sounds, Abd Soft, Non tender, No organomegaly appriciated, No rebound -guarding or rigidity. No Cyanosis, Clubbing or edema, No new Rash or bruise   Data Review   CBC w Diff: Lab Results  Component Value Date   WBC 3.9* 04/30/2016   WBC 5.8 08/24/2012   HGB 11.8* 04/30/2016   HGB 12.6 08/24/2012   HCT 35.1* 04/30/2016   HCT 39.7 08/24/2012   PLT 278 04/30/2016   LYMPHOPCT 40 04/30/2016   MONOPCT 10 04/30/2016   EOSPCT 3 04/30/2016   BASOPCT 0 04/30/2016    CMP: Lab Results  Component Value Date   NA 145 04/30/2016   K 3.3* 04/30/2016   CL 108 04/30/2016   CO2 25 04/30/2016   BUN <5* 04/30/2016   CREATININE 1.00  04/30/2016   CREATININE 0.89 08/24/2012   PROT 5.5* 04/27/2016   ALBUMIN 2.8* 04/27/2016  BILITOT 0.4 04/27/2016   ALKPHOS 38 04/27/2016   AST 15 04/27/2016   ALT 18 04/27/2016  .   Total Time in preparing paper work, data evaluation and todays exam - 45 minutes  Tawni Millers M.D on 04/30/2016 at 1:26 PM  [facility]   Office  [phone]

## 2016-04-30 NOTE — Progress Notes (Signed)
Patient ID: Deborah Pena, female   DOB: 09/03/1966, 50 y.o.   MRN: [id number]     [address] [place], [place] [address]    Phone: [phone] FAX: [phone]     Subjective: Afebrile.  VSS.  No pain.  Tolerating soft diet.   Objective:  Vital signs:  Filed Vitals:   04/28/16 1505 04/29/16 0612 04/29/16 1435 04/30/16 0602  BP:  120/70 116/65 102/67  Pulse:  78 71 67  Temp: 97.6 F (36.4 C) 98 F (36.7 C) 98.8 F (37.1 C) 98.2 F (36.8 C)  TempSrc: Axillary Oral Oral Oral  Resp:  15 17 16   Height:      Weight:      SpO2:  98% 98% 97%    Last BM Date: 04/29/16  Intake/Output   Yesterday:  05/11 0701 - 05/12 0700 In: 450 [P.O.:450] Out: 1950 [Urine:1950] This shift:  Total I/O In: 240 [P.O.:240] Out: -    Physical Exam: General: Pt awake/alert/oriented x4 in no  acute distress  Abdomen: Soft.  Nondistended.  Non tender.  No evidence of peritonitis.  No incarcerated hernias.   Problem List:   Principal Problem:   Acute diverticulitis Active Problems:   Diverticulitis of large intestine with abscess without bleeding    Results:   Labs: Results for orders placed or performed during the hospital encounter of 04/26/16 (from the past 48 hour(s))  CBC with Differential/Platelet     Status: Abnormal   Collection Time: 04/29/16  8:10 AM  Result Value Ref Range   WBC 4.4 4.0 - 10.5 K/uL   RBC 3.97 3.87 - 5.11 MIL/uL   Hemoglobin 12.2 12.0 - 15.0 g/dL   HCT 35.9 (L) 36.0 - 46.0 %   MCV 90.4 78.0 - 100.0 fL   MCH 30.7 26.0 - 34.0 pg   MCHC 34.0 30.0 - 36.0 g/dL   RDW 12.0 11.5 - 15.5 %   Platelets 249 150 - 400 K/uL   Neutrophils Relative % 58 %   Neutro Abs 2.6 1.7 - 7.7 K/uL   Lymphocytes Relative 30 %   Lymphs Abs 1.3 0.7 - 4.0 K/uL   Monocytes Relative 10 %   Monocytes Absolute 0.4 0.1 - 1.0 K/uL   Eosinophils Relative 2 %   Eosinophils Absolute 0.1 0.0 - 0.7 K/uL   Basophils  Relative 0 %   Basophils Absolute 0.0 0.0 - 0.1 K/uL  Basic metabolic panel     Status: Abnormal   Collection Time: 04/29/16  8:10 AM  Result Value Ref Range   Sodium 145 135 - 145 mmol/L   Potassium 3.7 3.5 - 5.1 mmol/L   Chloride 108 101 - 111 mmol/L   CO2 25 22 - 32 mmol/L   Glucose, Bld 119 (H) 65 - 99 mg/dL   BUN <5 (L) 6 - 20 mg/dL   Creatinine, Ser 0.97 0.44 - 1.00 mg/dL   Calcium 9.2 8.9 - 10.3 mg/dL   GFR calc non Af Amer >60 >60 mL/min   GFR calc Af Amer >60 >60 mL/min    Comment: (NOTE) The eGFR has been calculated using the CKD EPI equation. This calculation has not been validated in all clinical situations. eGFR's persistently <60 mL/min signify possible Chronic Kidney Disease.    Anion gap 12 5 - 15  CBC with Differential/Platelet     Status: Abnormal   Collection Time: 04/30/16  6:41 AM  Result Value  Ref Range   WBC 3.9 (L) 4.0 - 10.5 K/uL   RBC 3.85 (L) 3.87 - 5.11 MIL/uL   Hemoglobin 11.8 (L) 12.0 - 15.0 g/dL   HCT 35.1 (L) 36.0 - 46.0 %   MCV 91.2 78.0 - 100.0 fL   MCH 30.6 26.0 - 34.0 pg   MCHC 33.6 30.0 - 36.0 g/dL   RDW 12.2 11.5 - 15.5 %   Platelets 278 150 - 400 K/uL   Neutrophils Relative % 47 %   Neutro Abs 1.9 1.7 - 7.7 K/uL   Lymphocytes Relative 40 %   Lymphs Abs 1.6 0.7 - 4.0 K/uL   Monocytes Relative 10 %   Monocytes Absolute 0.4 0.1 - 1.0 K/uL   Eosinophils Relative 3 %   Eosinophils Absolute 0.1 0.0 - 0.7 K/uL   Basophils Relative 0 %   Basophils Absolute 0.0 0.0 - 0.1 K/uL  Basic metabolic panel     Status: Abnormal   Collection Time: 04/30/16  6:41 AM  Result Value Ref Range   Sodium 145 135 - 145 mmol/L   Potassium 3.3 (L) 3.5 - 5.1 mmol/L   Chloride 108 101 - 111 mmol/L   CO2 25 22 - 32 mmol/L   Glucose, Bld 106 (H) 65 - 99 mg/dL   BUN <5 (L) 6 - 20 mg/dL   Creatinine, Ser 1.00 0.44 - 1.00 mg/dL   Calcium 9.1 8.9 - 10.3 mg/dL   GFR calc non Af Amer >60 >60 mL/min   GFR calc Af Amer >60 >60 mL/min    Comment: (NOTE) The eGFR  has been calculated using the CKD EPI equation. This calculation has not been validated in all clinical situations. eGFR's persistently <60 mL/min signify possible Chronic Kidney Disease.    Anion gap 12 5 - 15    Imaging / Studies: No results found.  Medications / Allergies:  Scheduled Meds: . heparin subcutaneous  5,000 Units Subcutaneous Q8H  . hydroxypropyl methylcellulose / hypromellose  2 drop Both Eyes QID  . piperacillin-tazobactam  3.375 g Intravenous Q6H  . risperiDONE  9 mg Oral QHS  . tobramycin  1 drop Left Eye Q4H  . traZODone  300 mg Oral QHS   Continuous Infusions: . dextrose 5 % and 0.45% NaCl 75 mL/hr at 04/29/16 1517   PRN Meds:.acetaminophen **OR** acetaminophen, ALPRAZolam, methocarbamol (ROBAXIN)  IV, morphine injection, ondansetron **OR** ondansetron (ZOFRAN) IV, oxyCODONE, zolpidem  Antibiotics: Anti-infectives    Start     Dose/Rate Route Frequency Ordered Stop   04/27/16 1630  piperacillin-tazobactam (ZOSYN) IVPB 3.375 g     3.375 g 100 mL/hr over 30 Minutes Intravenous Every 6 hours 04/27/16 1539     04/27/16 0400  metroNIDAZOLE (FLAGYL) IVPB 500 mg  Status:  Discontinued     500 mg 100 mL/hr over 60 Minutes Intravenous Every 8 hours 04/26/16 2307 04/27/16 1539   04/27/16 0000  cefTRIAXone (ROCEPHIN) 2 g in dextrose 5 % 50 mL IVPB  Status:  Discontinued     2 g 100 mL/hr over 30 Minutes Intravenous Every 24 hours 04/26/16 2308 04/27/16 1539   04/26/16 2300  metroNIDAZOLE (FLAGYL) IVPB 500 mg  Status:  Discontinued     500 mg 100 mL/hr over 60 Minutes Intravenous Every 8 hours 04/26/16 2252 04/26/16 2307   04/26/16 2000  ciprofloxacin (CIPRO) IVPB 400 mg     400 mg 200 mL/hr over 60 Minutes Intravenous  Once 04/26/16 1955 04/26/16 2043   04/26/16 2000  metroNIDAZOLE (FLAGYL) IVPB  500 mg     500 mg 100 mL/hr over 60 Minutes Intravenous  Once 04/26/16 1955 04/29/16 0208        Assessment/Plan Acute sigmoid diverticulitis-stable for DC,  change to PO antibiotics for at total of 14 days of therapy.  Follow up with PCP for a referral for a colonoscopy in 6-8 weeks.  Please call surgery.  For further assistance.    Erby Pian, [facility] Pager [phone]) For consults and floor pages call [phone])  04/30/2016 10:33 AM

## 2016-04-30 NOTE — Progress Notes (Signed)
Pt discharged home this pm wit her husband. Discharge teaching provided with no concerns expressed

## 2017-01-14 ENCOUNTER — Encounter (HOSPITAL_COMMUNITY): Payer: Self-pay | Admitting: Emergency Medicine

## 2017-01-14 ENCOUNTER — Emergency Department (HOSPITAL_COMMUNITY)
Admission: EM | Admit: 2017-01-14 | Discharge: 2017-01-15 | Disposition: A | Discharge status: Other Acute Inpatient Hospital | Payer: Managed Care, Other (non HMO) | Attending: Emergency Medicine | Admitting: Emergency Medicine

## 2017-01-14 DIAGNOSIS — Z79899 Other long term (current) drug therapy: Secondary | ICD-10-CM | POA: Insufficient documentation

## 2017-01-14 DIAGNOSIS — F4323 Adjustment disorder with mixed anxiety and depressed mood: Secondary | ICD-10-CM | POA: Diagnosis not present

## 2017-01-14 DIAGNOSIS — Z8249 Family history of ischemic heart disease and other diseases of the circulatory system: Secondary | ICD-10-CM | POA: Diagnosis not present

## 2017-01-14 DIAGNOSIS — R45851 Suicidal ideations: Secondary | ICD-10-CM | POA: Diagnosis present

## 2017-01-14 DIAGNOSIS — F329 Major depressive disorder, single episode, unspecified: Secondary | ICD-10-CM | POA: Insufficient documentation

## 2017-01-14 DIAGNOSIS — J45909 Unspecified asthma, uncomplicated: Secondary | ICD-10-CM | POA: Insufficient documentation

## 2017-01-14 DIAGNOSIS — Z90710 Acquired absence of both cervix and uterus: Secondary | ICD-10-CM | POA: Diagnosis not present

## 2017-01-14 DIAGNOSIS — Z9889 Other specified postprocedural states: Secondary | ICD-10-CM | POA: Diagnosis not present

## 2017-01-14 DIAGNOSIS — Z87891 Personal history of nicotine dependence: Secondary | ICD-10-CM | POA: Insufficient documentation

## 2017-01-14 LAB — CBC
HEMATOCRIT: 34.7 % — AB (ref 36.0–46.0)
HEMOGLOBIN: 12.2 g/dL (ref 12.0–15.0)
MCH: 30.5 pg (ref 26.0–34.0)
MCHC: 35.2 g/dL (ref 30.0–36.0)
MCV: 86.8 fL (ref 78.0–100.0)
Platelets: 244 10*3/uL (ref 150–400)
RBC: 4 MIL/uL (ref 3.87–5.11)
RDW: 12 % (ref 11.5–15.5)
WBC: 6.9 10*3/uL (ref 4.0–10.5)

## 2017-01-14 LAB — COMPREHENSIVE METABOLIC PANEL
ALT: 24 U/L (ref 14–54)
AST: 19 U/L (ref 15–41)
Albumin: 4 g/dL (ref 3.5–5.0)
Alkaline Phosphatase: 44 U/L (ref 38–126)
Anion gap: 9 (ref 5–15)
BUN: 6 mg/dL (ref 6–20)
CO2: 23 mmol/L (ref 22–32)
Calcium: 9.1 mg/dL (ref 8.9–10.3)
Chloride: 108 mmol/L (ref 101–111)
Creatinine, Ser: 0.77 mg/dL (ref 0.44–1.00)
GFR calc Af Amer: >60 mL/min (ref >60)
GFR calc non Af Amer: >60 mL/min (ref >60)
Glucose, Bld: 85 mg/dL (ref 65–99)
Potassium: 3.5 mmol/L (ref 3.5–5.1)
Sodium: 140 mmol/L (ref 135–145)
Total Bilirubin: 0.6 mg/dL (ref 0.3–1.2)
Total Protein: 6.5 g/dL (ref 6.5–8.1)

## 2017-01-14 LAB — ACETAMINOPHEN LEVEL

## 2017-01-14 LAB — RAPID URINE DRUG SCREEN, HOSP PERFORMED
Amphetamines: NOT DETECTED
BARBITURATES: NOT DETECTED
BENZODIAZEPINES: NOT DETECTED
COCAINE: NOT DETECTED
Opiates: NOT DETECTED
TETRAHYDROCANNABINOL: NOT DETECTED

## 2017-01-14 LAB — SALICYLATE LEVEL: Salicylate Lvl: <7 mg/dL (ref 2.8–30.0)

## 2017-01-14 LAB — ETHANOL: Alcohol, Ethyl (B): 48 mg/dL — ABNORMAL HIGH (ref <5)

## 2017-01-14 MED ORDER — HYDROXYZINE HCL 25 MG PO TABS
25.0000 mg | ORAL_TABLET | Freq: Three times a day (TID) | ORAL | Status: DC | PRN
  Administered 2017-01-14 – 2017-01-15 (×2): 25 mg via ORAL
  Filled 2017-01-14 (×2): qty 1

## 2017-01-14 MED ORDER — ACETAMINOPHEN 325 MG PO TABS
650.0000 mg | ORAL_TABLET | Freq: Four times a day (QID) | ORAL | Status: DC | PRN
  Administered 2017-01-14: 650 mg via ORAL
  Filled 2017-01-14: qty 2

## 2017-01-14 MED ORDER — LORAZEPAM 1 MG PO TABS
1.0000 mg | ORAL_TABLET | Freq: Two times a day (BID) | ORAL | Status: DC | PRN
  Administered 2017-01-14 – 2017-01-15 (×2): 1 mg via ORAL
  Filled 2017-01-14 (×2): qty 1

## 2017-01-14 MED ORDER — TRAZODONE HCL 50 MG PO TABS
50.0000 mg | ORAL_TABLET | Freq: Every evening | ORAL | Status: DC | PRN
  Administered 2017-01-14: 50 mg via ORAL
  Filled 2017-01-14: qty 1

## 2017-01-14 NOTE — ED Notes (Signed)
SBAR Report received from previous nurse. Pt received calm and visible on unit. Pt denies current SI/ HI, A/V H at this time, and appears otherwise stable and free of distress. Pt rates pain 7/10 generalized, anxiety and depression "20/10". Physician called for medications.  Pt reminded of camera surveillance, q 15 min rounds, and rules of the milieu. Will continue to assess.

## 2017-01-14 NOTE — BH Assessment (Signed)
[place] Assessment Progress Note  Case was staffed with Reita Cliche DNP who recommends patient be re-evaluated in the a.m.

## 2017-01-14 NOTE — BH Assessment (Signed)
Assessment Note  Deborah Pena is an 51 y.o. female that presents voluntarily this date brought in by her daughter Deborah Pena [phone]. Patient states she is having some passive thoughts of self harm with no intent or plan. Patient presents with a very depressed affect and speaks in a low voce becoming tearful at times. Patient is oriented to time/place and denies any H/I, SA history or AVH. Patient states she is currently receiving services from [facility] Donata Clay NP) for the last five years who has been prescribing medications. Patient states for the last two weeks since a recent divorce, her symptoms have worsened reporting increased anxiety/depression with symptoms to include: "nervous and shaking all the time," guilt, hopelessness and "feeling lost." Patient stated she has attempted to harm herself "3 or 4 times" in her teenage years but is unable to recall dates or treatment episodes/details. Patient stated " I am to overwhelmed for you to be asking all these questions" and renders limited history. Patient stated she has one prior inpatient admission in 2010 where she stayed in the hospital for two weeks "somewhere in [place]" due to a "nervous breakdown." Patient cannot recall where she received treatment and is a poor historian. Patient cannot recall what current medications she is receiving but states "they are not working." Patient reports seeing her provider over a month ago and denies any recent changes to her current medication regimen. Patient states she "cannot wait until her next appointment to get help." Patient is requesting a voluntary admission to assist with medication management. Patient denies any current intent or plan to harm herself. Admission note this date states: "Patient was driven to the ED by a lift after being out of her anxiety and ADD medications for one week.  She called her doctor Donata Clay but could not be seen soon enough.  Patient reports having suicidal  thoughts for several months and mentioned that her husband recently left her. Patient is tearful holding her held saying she is not doing well mentally. Denies homicidal thoughts or any suicide plan". Case was staffed with Reita Cliche DNP who recommends patient be re-evaluated in the a.m.    Diagnosis: MDD recurrent without psychotic features, GAD   Past Medical History:  Past Medical History:  Diagnosis Date  . Anemia   . Anginal pain ([place])   . Anxiety   . Arthritis    "left hand" (04/26/2016)  . Asthma   . Chronic lower back pain   . Diverticulitis of colon   . GERD (gastroesophageal reflux disease)   . Headache    'once or twice/week" (04/26/2016)  . Migraine    "twice/month" (04/26/2016)  . Stomach ulcer     Past Surgical History:  Procedure Laterality Date  . ABDOMINAL HYSTERECTOMY  2013   preformed at [facility] by Dr Jacqualyn Posey in April  . BREAST SURGERY Bilateral ~ 2000   "dual lateral duct removal"   . BUNIONECTOMY Right 2013  . CESAREAN SECTION  1999  . OOPHORECTOMY Right 2013  . TONSILLECTOMY      Family History:  Family History  Problem Relation Age of Onset  . Heart failure Father   . Diabetes Other   . Breast cancer Other   . Diverticulitis Other     Social History:  reports that she has quit smoking. Her smoking use included Cigarettes. She has a 60.00 pack-year smoking history. She has quit using smokeless tobacco. Her smokeless tobacco use included Chew. She reports that she does not drink alcohol  or use drugs.  Additional Social History:  Alcohol / Drug Use Pain Medications: See MAR Prescriptions: See MAR Over the Counter: See MAR History of alcohol / drug use?: No history of alcohol / drug abuse Longest period of sobriety (when/how long):  (denies) Negative Consequences of Use:  (denies) Withdrawal Symptoms:  (denies)  CIWA: CIWA-Ar BP: 122/79 Pulse Rate: 102 COWS:    Allergies:  Allergies  Allergen Reactions  . Buprenorphine Hcl Other (See Comments)     "Becomes psycotic." --per notes from another healthcare network.   . Ciprofloxacin Itching    Sore throat, hoarseness  . Codeine Itching    Itches from inside out. PEr pt she can tolerate now.  . Morphine And Related Other (See Comments)    Becomes phsycotic.Per pt she is able to tolerate now.  . Propranolol     Syncope.     Home Medications:  (Not in a hospital admission)  OB/GYN Status:  No LMP recorded. Patient has had a hysterectomy.  General Assessment Data Location of Assessment: [facility] ED TTS Assessment: In system Is this a Tele or Face-to-Face Assessment?: Face-to-Face Is this an Initial Assessment or a Re-assessment for this encounter?: Initial Assessment Marital status: Divorced [place] name: na Is patient pregnant?: No Pregnancy Status: No Living Arrangements: Children Can pt return to current living arrangement?: Yes Admission Status: Voluntary Is patient capable of signing voluntary admission?: Yes Referral Source: Self/Family/Friend Insurance type: Programmer, systems Exam ([place]) Medical Exam completed: Yes  Crisis Care Plan Living Arrangements: Children Legal Guardian:  (na) Name of Psychiatrist: [place] NP Name of Therapist: None  Education Status Is patient currently in school?: No Current Grade: na Highest grade of school patient has completed: 12 Name of school: na Contact person: na  Risk to self with the past 6 months Suicidal Ideation: Yes-Currently Present Has patient been a risk to self within the past 6 months prior to admission? : No Suicidal Intent: No Has patient had any suicidal intent within the past 6 months prior to admission? : No Is patient at risk for suicide?: Yes Suicidal Plan?: No Has patient had any suicidal plan within the past 6 months prior to admission? : No Access to Means: No What has been your use of drugs/alcohol within the last 12 months?: Denies Previous Attempts/Gestures: Yes How many times?: 4  (teenage years) Other Self Harm Risks: na Triggers for Past Attempts: Family contact Intentional Self Injurious Behavior: None Family Suicide History: No Recent stressful life event(s): Other (Comment) (recent divorce) Persecutory voices/beliefs?: No Depression: Yes Depression Symptoms: Tearfulness, Guilt Substance abuse history and/or treatment for substance abuse?: No Suicide prevention information given to non-admitted patients: Not applicable  Risk to Others within the past 6 months Homicidal Ideation: No Does patient have any lifetime risk of violence toward others beyond the six months prior to admission? : No Thoughts of Harm to Others: No Current Homicidal Intent: No Current Homicidal Plan: No Access to Homicidal Means: No Identified Victim: na History of harm to others?: No Assessment of Violence: None Noted Violent Behavior Description: na Does patient have access to weapons?: No Criminal Charges Pending?: No Does patient have a court date: No Is patient on probation?: No  Psychosis Hallucinations: None noted Delusions: None noted  Mental Status Report Appearance/Hygiene: In scrubs Eye Contact: Poor Motor Activity: Freedom of movement Speech: Soft, Slow Level of Consciousness: Drowsy Mood: Depressed Affect: Anxious, Depressed Anxiety Level: Moderate Thought Processes: Coherent, Relevant Judgement: Unimpaired Orientation: Person, Place,  Time Obsessive Compulsive Thoughts/Behaviors: None  Cognitive Functioning Concentration: Normal Memory: Recent Intact, Remote Intact IQ: Average Insight: Fair Impulse Control: Fair Appetite: Fair Weight Loss: 0 Weight Gain: 0 Sleep: No Change Total Hours of Sleep: 6 Vegetative Symptoms: None  ADLScreening [facility] Assessment Services) Patient's cognitive ability adequate to safely complete daily activities?: Yes Patient able to express need for assistance with ADLs?: Yes Independently performs ADLs?: Yes (appropriate  for developmental age)  Prior Inpatient Therapy Prior Inpatient Therapy: Yes Prior Therapy Dates: 2010 Prior Therapy Facilty/Provider(s): pt cannot recall Reason for Treatment: MH issues  Prior Outpatient Therapy Prior Outpatient Therapy: Yes Prior Therapy Dates: 2017 Prior Therapy Facilty/Provider(s): [facility] Reason for Treatment: MH issues Does patient have an ACCT team?: No Does patient have Intensive In-House Services?  : No Does patient have [facility] services? : No Does patient have P4CC services?: No  ADL Screening (condition at time of admission) Patient's cognitive ability adequate to safely complete daily activities?: Yes Is the patient deaf or have difficulty hearing?: No Does the patient have difficulty seeing, even when wearing glasses/contacts?: No Does the patient have difficulty concentrating, remembering, or making decisions?: No Patient able to express need for assistance with ADLs?: Yes Does the patient have difficulty dressing or bathing?: No Independently performs ADLs?: Yes (appropriate for developmental age) Does the patient have difficulty walking or climbing stairs?: No Weakness of Legs: None Weakness of Arms/Hands: None  Home Assistive Devices/Equipment Home Assistive Devices/Equipment: None  Therapy Consults (therapy consults require a physician order) PT Evaluation Needed: No OT Evalulation Needed: No SLP Evaluation Needed: No Abuse/Neglect Assessment (Assessment to be complete while patient is alone) Physical Abuse: Denies Verbal Abuse: Denies Sexual Abuse: Denies Exploitation of patient/patient's resources: Denies Self-Neglect: Denies Values / Beliefs Cultural Requests During Hospitalization: None Spiritual Requests During Hospitalization: None Consults Spiritual Care Consult Needed: No Social Work Consult Needed: No Regulatory affairs officer (For Healthcare) Does Patient Have a Medical Advance Directive?: No Would patient like  information on creating a medical advance directive?: No - Patient declined    Additional Information 1:1 In Past 12 Months?: No CIRT Risk: No Elopement Risk: No Does patient have medical clearance?: Yes     Disposition: Case was staffed with Reita Cliche DNP who recommends patient be re-evaluated in the a.m.   Disposition Initial Assessment Completed for this Encounter: Yes Disposition of Patient: Other dispositions Other disposition(s): Other (Comment) (pt will be re-evaluated in a.m.)  On Site Evaluation by:   Reviewed with Physician:    Mamie Nick 01/14/2017 6:18 PM

## 2017-01-14 NOTE — ED Triage Notes (Signed)
Patient was driven to the ED by a lift after being out of her anxiety and ADD medications for one week.  She called her doctor Donata Clay but could not be seen soon enough.  Patient reports having suicidal thoughts for several months and mentioned that her husband recently left her.  Patient is tearful holding her held saying she is not doing well mentally.  Denies homicidal thoughts or any suicide plan.

## 2017-01-14 NOTE — ED Provider Notes (Signed)
[facility] DEPT Provider Note   CSN: [id number] Arrival date & time: 01/14/17  1620     History   Chief Complaint Chief Complaint  Patient presents with  . Suicidal    HPI Deborah Pena is a 51 y.o. female. She presents for evaluation of depression.  HPI:  Patient self presents the emergency room stating she is depressed. She sees Dr. Donata Clay. Has a history of anxiety and ADD been out of her medications. States she's called for appointment has not been able to be seen. She states that she is concerned that she may do something to herself and feels hopeless.  She has been depressed for most her daughter live. She states when she was teenager she attempted herself several times but has not since that time.  Takes Xanax for anxiety, Adderall for ADD.  Past Medical History:  Diagnosis Date  . Anemia   . Anginal pain ([place])   . Anxiety   . Arthritis    "left hand" (04/26/2016)  . Asthma   . Chronic lower back pain   . Diverticulitis of colon   . GERD (gastroesophageal reflux disease)   . Headache    'once or twice/week" (04/26/2016)  . Migraine    "twice/month" (04/26/2016)  . Stomach ulcer     Patient Active Problem List   Diagnosis Date Noted  . Acute diverticulitis 04/26/2016  . Diverticulitis of large intestine with abscess without bleeding 04/26/2016  . Intractable migraine 07/27/2014  . Adjustment disorder with anxiety 12/06/2012    Past Surgical History:  Procedure Laterality Date  . ABDOMINAL HYSTERECTOMY  2013   preformed at [facility] by Dr Jacqualyn Posey in April  . BREAST SURGERY Bilateral ~ 2000   "dual lateral duct removal"   . BUNIONECTOMY Right 2013  . CESAREAN SECTION  1999  . OOPHORECTOMY Right 2013  . TONSILLECTOMY      OB History    No data available       Home Medications    Prior to Admission medications   Medication Sig Start Date End Date Taking? Authorizing Provider  acetaminophen (TYLENOL) 325 MG tablet Take 650 mg by mouth every  6 (six) hours as needed.   Yes Historical Provider, MD  ALPRAZolam Duanne Moron) 1 MG tablet Take 1 mg by mouth 3 (three) times daily as needed for anxiety.   Yes Historical Provider, MD  amphetamine-dextroamphetamine (ADDERALL) 20 MG tablet Take 20 mg by mouth 2 (two) times daily.   Yes Historical Provider, MD  risperiDONE (RISPERDAL) 3 MG tablet Take 9 mg by mouth at bedtime.   Yes Historical Provider, MD  zolpidem (AMBIEN) 10 MG tablet Take 10 mg by mouth at bedtime as needed for sleep.   Yes Historical Provider, MD    Family History Family History  Problem Relation Age of Onset  . Heart failure Father   . Diabetes Other   . Breast cancer Other   . Diverticulitis Other     Social History Social History  Substance Use Topics  . Smoking status: Former Smoker    Packs/day: 2.00    Years: 30.00    Types: Cigarettes  . Smokeless tobacco: Former Systems developer    Types: Chew     Comment: "quit smoking cigarettes in ~ 2011; chewed when I was little"  . Alcohol use No     Allergies   Buprenorphine hcl; Ciprofloxacin; Codeine; Morphine and related; and Propranolol   Review of Systems Review of Systems  Constitutional: Negative for appetite change, chills,  diaphoresis, fatigue and fever.  HENT: Negative for mouth sores, sore throat and trouble swallowing.   Eyes: Negative for visual disturbance.  Respiratory: Negative for cough, chest tightness, shortness of breath and wheezing.   Cardiovascular: Negative for chest pain.  Gastrointestinal: Negative for abdominal distention, abdominal pain, diarrhea, nausea and vomiting.  Endocrine: Negative for polydipsia, polyphagia and polyuria.  Genitourinary: Negative for dysuria, frequency and hematuria.  Musculoskeletal: Negative for gait problem.  Skin: Negative for color change, pallor and rash.  Neurological: Negative for dizziness, syncope, light-headedness and headaches.  Hematological: Does not bruise/bleed easily.  Psychiatric/Behavioral:  Positive for dysphoric mood and suicidal ideas. Negative for behavioral problems and confusion. The patient is nervous/anxious.      Physical Exam Updated Vital Signs BP 122/83 (BP Location: Left Arm)   Pulse 73   Temp 97.6 F (36.4 C) (Oral)   Resp 16   Ht 5\' 3"  (1.6 m)   SpO2 98%   Physical Exam  Constitutional: She is oriented to person, place, and time. She appears well-developed and well-nourished. No distress.  HENT:  Head: Normocephalic.  Eyes: Conjunctivae are normal. Pupils are equal, round, and reactive to light. No scleral icterus.  Neck: Normal range of motion. Neck supple. No thyromegaly present.  Cardiovascular: Normal rate and regular rhythm.  Exam reveals no gallop and no friction rub.   No murmur heard. Pulmonary/Chest: Effort normal and breath sounds normal. No respiratory distress. She has no wheezes. She has no rales.  Abdominal: Soft. Bowel sounds are normal. She exhibits no distension. There is no tenderness. There is no rebound.  Musculoskeletal: Normal range of motion.  Neurological: She is alert and oriented to person, place, and time.  Skin: Skin is warm and dry. No rash noted.  Psychiatric: Her behavior is normal. Her mood appears anxious. She exhibits a depressed mood.     ED Treatments / Results  Labs (all labs ordered are listed, but only abnormal results are displayed) Labs Reviewed  ETHANOL - Abnormal; Notable for the following:       Result Value   Alcohol, Ethyl (B) 48 (*)    All other components within normal limits  ACETAMINOPHEN LEVEL - Abnormal; Notable for the following:    Acetaminophen (Tylenol), Serum <10 (*)    All other components within normal limits  CBC - Abnormal; Notable for the following:    HCT 34.7 (*)    All other components within normal limits  COMPREHENSIVE METABOLIC PANEL  SALICYLATE LEVEL  RAPID URINE DRUG SCREEN, HOSP PERFORMED    EKG  EKG Interpretation None       Radiology No results  found.  Procedures Procedures (including critical care time)  Medications Ordered in ED Medications  LORazepam (ATIVAN) tablet 1 mg (1 mg Oral Given 01/14/17 1917)  acetaminophen (TYLENOL) tablet 650 mg (650 mg Oral Given 01/14/17 2101)  traZODone (DESYREL) tablet 50 mg (50 mg Oral Given 01/14/17 2101)  hydrOXYzine (ATARAX/VISTARIL) tablet 25 mg (25 mg Oral Given 01/14/17 2101)     Initial Impression / Assessment and Plan / ED Course  I have reviewed the triage vital signs and the nursing notes.  Pertinent labs & imaging results that were available during my care of the patient were reviewed by me and considered in my medical decision making (see chart for details).     Medically clear. Awaiting [facility] evaluation.  Final Clinical Impressions(s) / ED Diagnoses   Final diagnoses:  Suicidal ideation    New Prescriptions New Prescriptions  No medications on file     Tanna Furry, MD 01/14/17 2151

## 2017-01-15 ENCOUNTER — Encounter (HOSPITAL_COMMUNITY): Payer: Self-pay

## 2017-01-15 ENCOUNTER — Inpatient Hospital Stay (HOSPITAL_COMMUNITY)
Admission: AD | Admit: 2017-01-15 | Discharge: 2017-01-21 | DRG: 885 | Disposition: A | Discharge status: Home / Self Care | Payer: Managed Care, Other (non HMO) | Source: Intra-hospital | Attending: Psychiatry | Admitting: Psychiatry

## 2017-01-15 DIAGNOSIS — F410 Panic disorder [episodic paroxysmal anxiety] without agoraphobia: Secondary | ICD-10-CM

## 2017-01-15 DIAGNOSIS — F4310 Post-traumatic stress disorder, unspecified: Secondary | ICD-10-CM | POA: Diagnosis present

## 2017-01-15 DIAGNOSIS — R45851 Suicidal ideations: Secondary | ICD-10-CM

## 2017-01-15 DIAGNOSIS — F329 Major depressive disorder, single episode, unspecified: Secondary | ICD-10-CM | POA: Diagnosis not present

## 2017-01-15 DIAGNOSIS — G4700 Insomnia, unspecified: Secondary | ICD-10-CM | POA: Diagnosis present

## 2017-01-15 DIAGNOSIS — Z90710 Acquired absence of both cervix and uterus: Secondary | ICD-10-CM | POA: Diagnosis not present

## 2017-01-15 DIAGNOSIS — R7989 Other specified abnormal findings of blood chemistry: Secondary | ICD-10-CM

## 2017-01-15 DIAGNOSIS — F4323 Adjustment disorder with mixed anxiety and depressed mood: Secondary | ICD-10-CM

## 2017-01-15 DIAGNOSIS — Z9889 Other specified postprocedural states: Secondary | ICD-10-CM

## 2017-01-15 DIAGNOSIS — F332 Major depressive disorder, recurrent severe without psychotic features: Secondary | ICD-10-CM | POA: Diagnosis present

## 2017-01-15 DIAGNOSIS — Z8249 Family history of ischemic heart disease and other diseases of the circulatory system: Secondary | ICD-10-CM

## 2017-01-15 DIAGNOSIS — Z79899 Other long term (current) drug therapy: Secondary | ICD-10-CM

## 2017-01-15 DIAGNOSIS — Z915 Personal history of self-harm: Secondary | ICD-10-CM | POA: Diagnosis not present

## 2017-01-15 DIAGNOSIS — Z833 Family history of diabetes mellitus: Secondary | ICD-10-CM

## 2017-01-15 DIAGNOSIS — Z818 Family history of other mental and behavioral disorders: Secondary | ICD-10-CM

## 2017-01-15 DIAGNOSIS — Z87891 Personal history of nicotine dependence: Secondary | ICD-10-CM

## 2017-01-15 DIAGNOSIS — F251 Schizoaffective disorder, depressive type: Principal | ICD-10-CM | POA: Diagnosis present

## 2017-01-15 DIAGNOSIS — Z888 Allergy status to other drugs, medicaments and biological substances status: Secondary | ICD-10-CM

## 2017-01-15 DIAGNOSIS — E229 Hyperfunction of pituitary gland, unspecified: Secondary | ICD-10-CM

## 2017-01-15 DIAGNOSIS — Z803 Family history of malignant neoplasm of breast: Secondary | ICD-10-CM

## 2017-01-15 MED ORDER — MAGNESIUM HYDROXIDE 400 MG/5ML PO SUSP: 30.0000 mL | Freq: Every day | ORAL | Status: DC | PRN

## 2017-01-15 MED ORDER — TRAZODONE HCL 50 MG PO TABS
50.0000 mg | ORAL_TABLET | Freq: Every evening | ORAL | Status: DC | PRN
  Administered 2017-01-15 (×2): 50 mg via ORAL
  Filled 2017-01-15 (×4): qty 1

## 2017-01-15 MED ORDER — BENZTROPINE MESYLATE 1 MG PO TABS
ORAL_TABLET | ORAL | Status: AC
  Administered 2017-01-15: 14:00:00
  Filled 2017-01-15: qty 1

## 2017-01-15 MED ORDER — GABAPENTIN 300 MG PO CAPS: 300.0000 mg | ORAL_CAPSULE | Freq: Three times a day (TID) | ORAL | Status: DC

## 2017-01-15 MED ORDER — HYDROXYZINE HCL 25 MG PO TABS
25.0000 mg | ORAL_TABLET | Freq: Three times a day (TID) | ORAL | Status: DC | PRN
  Administered 2017-01-15 – 2017-01-19 (×4): 25 mg via ORAL
  Filled 2017-01-15 (×4): qty 1

## 2017-01-15 MED ORDER — LORAZEPAM 0.5 MG PO TABS: 0.5000 mg | ORAL_TABLET | Freq: Once | ORAL | Status: DC | PRN

## 2017-01-15 MED ORDER — BENZTROPINE MESYLATE 1 MG PO TABS
1.0000 mg | ORAL_TABLET | Freq: Two times a day (BID) | ORAL | Status: DC
  Administered 2017-01-15 – 2017-01-16 (×2): 1 mg via ORAL
  Filled 2017-01-15 (×5): qty 1

## 2017-01-15 MED ORDER — CITALOPRAM HYDROBROMIDE 10 MG PO TABS
10.0000 mg | ORAL_TABLET | Freq: Every day | ORAL | Status: DC
Start: 2017-01-16 — End: 2017-01-16
  Filled 2017-01-15 (×2): qty 1

## 2017-01-15 MED ORDER — GABAPENTIN 300 MG PO CAPS
300.0000 mg | ORAL_CAPSULE | Freq: Three times a day (TID) | ORAL | Status: DC
  Administered 2017-01-15 – 2017-01-18 (×9): 300 mg via ORAL
  Filled 2017-01-15 (×14): qty 1

## 2017-01-15 MED ORDER — NICOTINE 21 MG/24HR TD PT24
21.0000 mg | MEDICATED_PATCH | Freq: Every day | TRANSDERMAL | Status: DC
  Administered 2017-01-15 – 2017-01-21 (×7): 21 mg via TRANSDERMAL
  Filled 2017-01-15 (×9): qty 1

## 2017-01-15 MED ORDER — ALUM & MAG HYDROXIDE-SIMETH 200-200-20 MG/5ML PO SUSP: 30.0000 mL | ORAL | Status: DC | PRN

## 2017-01-15 MED ORDER — ACETAMINOPHEN 325 MG PO TABS: 650.0000 mg | ORAL_TABLET | Freq: Four times a day (QID) | ORAL | Status: DC | PRN

## 2017-01-15 MED ORDER — ACETAMINOPHEN 325 MG PO TABS
650.0000 mg | ORAL_TABLET | Freq: Four times a day (QID) | ORAL | Status: DC | PRN
  Administered 2017-01-16 – 2017-01-21 (×4): 650 mg via ORAL
  Filled 2017-01-15 (×5): qty 2

## 2017-01-15 MED ORDER — CITALOPRAM HYDROBROMIDE 10 MG PO TABS: 10.0000 mg | ORAL_TABLET | Freq: Every day | ORAL | Status: DC

## 2017-01-15 NOTE — Progress Notes (Signed)
D: Pt presents with depressed, anxious affect and mood.  She reports her goal is to "stay up 'til 9 so I can get my medication, make a friend."  Pt reports that she is worried that she will begin to have hallucinations within the next few days because her Risperdal has not been continued.  Denies SI/HI, denies hallucinations, denies pain.  Pt attended evening group and has been interacting appropriately with peers and staff.    A: Introduced self to pt.  Support and encouragement provided.  Encouraged pt to discuss medication concerns with provider tomorrow.  Medication administered per order.  PRN medication administered for anxiety.  Q15 minute safety checks maintained.    R: Pt is compliant with medication.  She is safe on the unit and she verbally contracts for safety.
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
## 2017-01-15 NOTE — ED Notes (Signed)
Patient c/o anxiety.  She reports "coming off of" adderall and xanax that is prescribed by her psych provider.  PRN medications requested and received. Patient then stated she wants treatment for PTSD.  Reports that she has not discussed this with her provider. Support offered. Denies SI. Affect is flat. 15' checks continued.

## 2017-01-15 NOTE — Progress Notes (Signed)
D) 51 year old admitted voluntarily to the service of Dr. Parke Poisson. Pt reports that her husband asked for a divorce 3 months ago and she and her daughter (age 47) had to move out of the home they had been living in. Pt has been seeing Heloise Purpura NP. Has been on Risperdal 9 MG nightly for a long time. Pt's AIMS on admission was 8. At this time, NP notified and Pt was given 1 mg of Cogentin for symptoms of akathisia. Pt states since her husband told her he wanted a divorce she has had increasing anxiety, hopelessness, not able to function and her anxiety has increased. Pt also reports that she has auditory and visual hallucinations. "I hear a female's voice talking to my daughter. I can't make out what he is  saying to her". Pt also reports, "I see people and I hear them threatening and arguing with me". Pt states, "I know that they are not really there and that no one is really talking, but it sounds and looks so very real". Pt also states she was having thoughts of self harm, but is not at present.  A) Given support, reassurance and praise. Alerted the NP to Pt's [place] and Pt was given 1 mg of Cogentin. Oriented to the unit. Provided with a 1:1 and Pt's clothing was washed. Verbal contract made with Pt. For her safety.  R) Pt is resting in her room. Concerned about not getting her usual medications yet states, "they weren't working anyway".

## 2017-01-15 NOTE — Progress Notes (Signed)
[facility] Group Notes:  (Nursing/MHT/Case Management/Adjunct)  Date:  01/15/2017  Time:  10:37 PM  Type of Therapy:  Psychoeducational Skills  Participation Level:  Active  Participation Quality:  Appropriate  Affect:  Appropriate  Cognitive:  Appropriate  Insight:  Appropriate  Engagement in Group:  Engaged  Modes of Intervention:  Education  Summary of Progress/Problems: The patient shared in group that her day was both negative and positive. The patient also stated that she is trying to use different coping skills in place of her medication. As for the theme of the day, her coping skill will be to talk with people and to try and stay out of bed more often.   Deborah Pena S 01/15/2017, 10:37 PM

## 2017-01-15 NOTE — Plan of Care (Signed)
Problem: Medication: Goal: Compliance with prescribed medication regimen will improve Outcome: Progressing Pt has been compliant with medications tonight.    

## 2017-01-15 NOTE — ED Notes (Signed)
C/O anxiety. Requested and received prn vistaril. Will continue to assess.

## 2017-01-15 NOTE — Tx Team (Signed)
Initial Treatment Plan 01/15/2017 5:36 PM Deborah Pena [id number]    PATIENT STRESSORS: Financial difficulties Loss of  relationship   PATIENT STRENGTHS: Average or above average intelligence Capable of independent living General fund of knowledge Motivation for treatment/growth   PATIENT IDENTIFIED PROBLEMS: Loss of Relationship/Divorce  Auditory and visual hallucinations   Increased anxiety  Suicidal Ideation-presently passive               DISCHARGE CRITERIA:  Improved stabilization in mood, thinking, and/or behavior Motivation to continue treatment in a less acute level of care Need for constant or close observation no longer present Reduction of life-threatening or endangering symptoms to within safe limits Safe-care adequate arrangements made Verbal commitment to aftercare and medication compliance  PRELIMINARY DISCHARGE PLAN: Attend aftercare/continuing care group Outpatient therapy  PATIENT/FAMILY INVOLVEMENT: This treatment plan has been presented to and reviewed with the patient, Deborah Pena, and/or family member,   The patient and family have been given the opportunity to ask questions and make suggestions.  Paulino Rily, RN 01/15/2017, 5:36 PM

## 2017-01-15 NOTE — Consult Note (Signed)
[facility] Face-to-Face Psychiatry Consult   Reason for Consult: worsening anxiety, suicidal ideations Referring Physician:  ED Physician  Patient Identification: Deborah Pena MRN:  [id number] Principal Diagnosis: Adjustment disorder with mixed anxiety and depressed mood Diagnosis:   Patient Active Problem List   Diagnosis Date Noted  . Adjustment disorder with mixed anxiety and depressed mood [F43.23] 01/15/2017  . Acute diverticulitis [K57.92] 04/26/2016  . Diverticulitis of large intestine with abscess without bleeding [K57.20] 04/26/2016  . Intractable migraine [G43.919] 07/27/2014    Total Time spent with patient: 30 minutes  Subjective:   Deborah Pena is a 51 y.o. female patient admitted with increased anxiety, suicidal ideations   HPI: 51 year old female, presented to ED reporting worsening suicidal ideations and increased anxiety. She has been facing significant psychosocial stressors - reports she is recently separated/divorced. Reports severe anxiety, feeling " shaky", and symptoms of depression such as low energy level , poor sleep, guilty ruminations . Endorses passive SI, but does not endorse any plan or intention She reports she has been off psychiatric medications for several days prior to admission. Of note, reports medications included Ambien, Xanax, Adderall, Risperidone. * Of note, staff contacted pharmacy, and report is that patient has not refilled Xanax in one month . UDS is negative, admission BAL 48. Patient acknowledges drinking prior to admission but denies any pattern of alcohol abuse . At this time reports feeling " shaky", but is not overtly tremulous, diaphoretic , vitals are stable ( slightly tachycardic- 109)     Past Psychiatric History: reports history of anxiety, depression- reports she was being prescribed Xanax, Adderall, Risperidone , Ambien.  Risk to Self: Suicidal Ideation: Yes-Currently Present Suicidal Intent: No Is patient at risk for suicide?:  Yes Suicidal Plan?: No Access to Means: No What has been your use of drugs/alcohol within the last 12 months?: Denies How many times?: 4 (teenage years) Other Self Harm Risks: na Triggers for Past Attempts: Family contact Intentional Self Injurious Behavior: None Risk to Others: Homicidal Ideation: No Thoughts of Harm to Others: No Current Homicidal Intent: No Current Homicidal Plan: No Access to Homicidal Means: No Identified Victim: na History of harm to others?: No Assessment of Violence: None Noted Violent Behavior Description: na Does patient have access to weapons?: No Criminal Charges Pending?: No Does patient have a court date: No Prior Inpatient Therapy: Prior Inpatient Therapy: Yes Prior Therapy Dates: 2010 Prior Therapy Facilty/Provider(s): pt cannot recall Reason for Treatment: MH issues Prior Outpatient Therapy: Prior Outpatient Therapy: Yes Prior Therapy Dates: 2017 Prior Therapy Facilty/Provider(s): [facility] Reason for Treatment: MH issues Does patient have an ACCT team?: No Does patient have Intensive In-House Services?  : No Does patient have [facility] services? : No Does patient have P4[facility] services?: No  Past Medical History:  Past Medical History:  Diagnosis Date  . Anemia   . Anginal pain ([place])   . Anxiety   . Arthritis    "left hand" (04/26/2016)  . Asthma   . Chronic lower back pain   . Diverticulitis of colon   . GERD (gastroesophageal reflux disease)   . Headache    'once or twice/week" (04/26/2016)  . Migraine    "twice/month" (04/26/2016)  . Stomach ulcer     Past Surgical History:  Procedure Laterality Date  . ABDOMINAL HYSTERECTOMY  2013   preformed at [facility] by Dr Jacqualyn Posey in April  . BREAST SURGERY Bilateral ~ 2000   "dual lateral duct removal"   . BUNIONECTOMY Right 2013  .  CESAREAN SECTION  1999  . OOPHORECTOMY Right 2013  . TONSILLECTOMY     Family History:  Family History  Problem Relation Age of Onset  . Heart  failure Father   . Diabetes Other   . Breast cancer Other   . Diverticulitis Other    Family Psychiatric  History: non contributory  Social History:  History  Alcohol Use No     History  Drug Use No    Social History   Social History  . Marital status: Married    Spouse name: N/A  . Number of children: N/A  . Years of education: N/A   Social History Main Topics  . Smoking status: Former Smoker    Packs/day: 2.00    Years: 30.00    Types: Cigarettes  . Smokeless tobacco: Former Systems developer    Types: Chew     Comment: "quit smoking cigarettes in ~ 2011; chewed when I was little"  . Alcohol use No  . Drug use: No  . Sexual activity: Not Currently   Other Topics Concern  . None   Social History Narrative  . None   Additional Social History:    Allergies:   Allergies  Allergen Reactions  . Buprenorphine Hcl Other (See Comments)    "Becomes psycotic." --per notes from another healthcare network.   . Ciprofloxacin Itching    Sore throat, hoarseness  . Codeine Itching    Itches from inside out. PEr pt she can tolerate now.  . Morphine And Related Other (See Comments)    Becomes phsycotic.Per pt she is able to tolerate now.  . Propranolol     Syncope.     Labs:  Results for orders placed or performed during the hospital encounter of 01/14/17 (from the past 48 hour(s))  Rapid urine drug screen (hospital performed)     Status: None   Collection Time: 01/14/17  5:00 PM  Result Value Ref Range   Opiates NONE DETECTED NONE DETECTED   Cocaine NONE DETECTED NONE DETECTED   Benzodiazepines NONE DETECTED NONE DETECTED   Amphetamines NONE DETECTED NONE DETECTED   Tetrahydrocannabinol NONE DETECTED NONE DETECTED   Barbiturates NONE DETECTED NONE DETECTED    Comment:        DRUG SCREEN FOR MEDICAL PURPOSES ONLY.  IF CONFIRMATION IS NEEDED FOR ANY PURPOSE, NOTIFY LAB WITHIN 5 DAYS.        LOWEST DETECTABLE LIMITS FOR URINE DRUG SCREEN Drug Class       Cutoff  (ng/mL) Amphetamine      1000 Barbiturate      200 Benzodiazepine   633 Tricyclics       354 Opiates          300 Cocaine          300 THC              50   Comprehensive metabolic panel     Status: None   Collection Time: 01/14/17  6:00 PM  Result Value Ref Range   Sodium 140 135 - 145 mmol/L   Potassium 3.5 3.5 - 5.1 mmol/L   Chloride 108 101 - 111 mmol/L   CO2 23 22 - 32 mmol/L   Glucose, Bld 85 65 - 99 mg/dL   BUN 6 6 - 20 mg/dL   Creatinine, Ser 0.77 0.44 - 1.00 mg/dL   Calcium 9.1 8.9 - 10.3 mg/dL   Total Protein 6.5 6.5 - 8.1 g/dL   Albumin 4.0 3.5 - 5.0 g/dL  AST 19 15 - 41 U/L   ALT 24 14 - 54 U/L   Alkaline Phosphatase 44 38 - 126 U/L   Total Bilirubin 0.6 0.3 - 1.2 mg/dL   GFR calc non Af Amer >60 >60 mL/min   GFR calc Af Amer >60 >60 mL/min    Comment: (NOTE) The eGFR has been calculated using the CKD EPI equation. This calculation has not been validated in all clinical situations. eGFR's persistently <60 mL/min signify possible Chronic Kidney Disease.    Anion gap 9 5 - 15  Ethanol     Status: Abnormal   Collection Time: 01/14/17  6:00 PM  Result Value Ref Range   Alcohol, Ethyl (B) 48 (H) <5 mg/dL    Comment:        LOWEST DETECTABLE LIMIT FOR SERUM ALCOHOL IS 5 mg/dL FOR MEDICAL PURPOSES ONLY   Salicylate level     Status: None   Collection Time: 01/14/17  6:00 PM  Result Value Ref Range   Salicylate Lvl <2.9 2.8 - 30.0 mg/dL  Acetaminophen level     Status: Abnormal   Collection Time: 01/14/17  6:00 PM  Result Value Ref Range   Acetaminophen (Tylenol), Serum <10 (L) 10 - 30 ug/mL    Comment:        THERAPEUTIC CONCENTRATIONS VARY SIGNIFICANTLY. A RANGE OF 10-30 ug/mL MAY BE AN EFFECTIVE CONCENTRATION FOR MANY PATIENTS. HOWEVER, SOME ARE BEST TREATED AT CONCENTRATIONS OUTSIDE THIS RANGE. ACETAMINOPHEN CONCENTRATIONS >150 ug/mL AT 4 HOURS AFTER INGESTION AND >50 ug/mL AT 12 HOURS AFTER INGESTION ARE OFTEN ASSOCIATED WITH  TOXIC REACTIONS.   cbc     Status: Abnormal   Collection Time: 01/14/17  6:00 PM  Result Value Ref Range   WBC 6.9 4.0 - 10.5 K/uL   RBC 4.00 3.87 - 5.11 MIL/uL   Hemoglobin 12.2 12.0 - 15.0 g/dL   HCT 34.7 (L) 36.0 - 46.0 %   MCV 86.8 78.0 - 100.0 fL   MCH 30.5 26.0 - 34.0 pg   MCHC 35.2 30.0 - 36.0 g/dL   RDW 12.0 11.5 - 15.5 %   Platelets 244 150 - 400 K/uL    Current Facility-Administered Medications  Medication Dose Route Frequency Provider Last Rate Last Dose  . acetaminophen (TYLENOL) tablet 650 mg  650 mg Oral Q6H PRN Rozetta Nunnery, NP   650 mg at 01/14/17 2101  . hydrOXYzine (ATARAX/VISTARIL) tablet 25 mg  25 mg Oral TID PRN Rozetta Nunnery, NP   25 mg at 01/15/17 1010  . traZODone (DESYREL) tablet 50 mg  50 mg Oral QHS,MR X 1 Rozetta Nunnery, NP   50 mg at 01/14/17 2101   Current Outpatient Prescriptions  Medication Sig Dispense Refill  . acetaminophen (TYLENOL) 325 MG tablet Take 650 mg by mouth every 6 (six) hours as needed.    . ALPRAZolam (XANAX) 1 MG tablet Take 1 mg by mouth 3 (three) times daily as needed for anxiety.    Marland Kitchen amphetamine-dextroamphetamine (ADDERALL) 20 MG tablet Take 20 mg by mouth 2 (two) times daily.    . risperiDONE (RISPERDAL) 3 MG tablet Take 9 mg by mouth at bedtime.    Marland Kitchen zolpidem (AMBIEN) 10 MG tablet Take 10 mg by mouth at bedtime as needed for sleep.      Musculoskeletal: Strength & Muscle Tone: within normal limits no gross tremors, no diaphoresis Gait & Station: normal Patient leans: N/A  Psychiatric Specialty Exam: Physical Exam  ROS denies nausea, no vomiting ,  no shortness of breath   Blood pressure 113/60, pulse 109, temperature 98.7 F (37.1 C), temperature source Oral, resp. rate 18, height _0  (1.6 m), SpO2 100 %.There is no height or weight on file to calculate BMI.  General Appearance: Fairly Groomed  Eye Contact:  Good  Speech:  Normal Rate  Volume:  Normal  Mood:  Depressed , vaguely irritable   Affect:  Constricted and  anxious   Thought Process:  Linear  Orientation:  Full (Time, Place, and Person)  Thought Content:  No hallucinations, no delusions, not internally preoccupied   Suicidal Thoughts:  Yes.  without intent/plan passive SI, no suicidal plan or intention   Homicidal Thoughts:  No  Memory:  recent and remote grossly intact   Judgement:  Fair  Insight:  Fair  Psychomotor Activity:  Normal- no significant tremors, no restlessness   Concentration:  Concentration: Good and Attention Span: Good  Recall:  Good  Fund of Knowledge:  Good  Language:  Good  Akathisia:  Negative  Handed:  Right  AIMS (if indicated):     Assets:  Desire for Improvement Resilience  ADL's:  fair  Cognition:  WNL  Sleep:        Treatment Plan Summary: Daily contact with patient to assess and evaluate symptoms and progress in treatment  Disposition: Recommend psychiatric Inpatient admission when medically cleared. Patient is agreeing to inpatient psychiatric admission. We discussed options- agrees to Celexa trial  Consider Neurontin trial for anxiety.  Neita Garnet, MD 01/15/2017 11:38 AM

## 2017-01-16 ENCOUNTER — Encounter (HOSPITAL_COMMUNITY): Payer: Self-pay | Admitting: Psychiatry

## 2017-01-16 DIAGNOSIS — F4310 Post-traumatic stress disorder, unspecified: Secondary | ICD-10-CM

## 2017-01-16 DIAGNOSIS — Z9889 Other specified postprocedural states: Secondary | ICD-10-CM

## 2017-01-16 DIAGNOSIS — Z79899 Other long term (current) drug therapy: Secondary | ICD-10-CM

## 2017-01-16 DIAGNOSIS — F410 Panic disorder [episodic paroxysmal anxiety] without agoraphobia: Secondary | ICD-10-CM

## 2017-01-16 DIAGNOSIS — Z818 Family history of other mental and behavioral disorders: Secondary | ICD-10-CM

## 2017-01-16 DIAGNOSIS — F251 Schizoaffective disorder, depressive type: Principal | ICD-10-CM

## 2017-01-16 DIAGNOSIS — Z90710 Acquired absence of both cervix and uterus: Secondary | ICD-10-CM

## 2017-01-16 DIAGNOSIS — Z888 Allergy status to other drugs, medicaments and biological substances status: Secondary | ICD-10-CM

## 2017-01-16 DIAGNOSIS — Z803 Family history of malignant neoplasm of breast: Secondary | ICD-10-CM

## 2017-01-16 DIAGNOSIS — Z8489 Family history of other specified conditions: Secondary | ICD-10-CM

## 2017-01-16 DIAGNOSIS — Z8249 Family history of ischemic heart disease and other diseases of the circulatory system: Secondary | ICD-10-CM

## 2017-01-16 DIAGNOSIS — Z833 Family history of diabetes mellitus: Secondary | ICD-10-CM

## 2017-01-16 MED ORDER — NON FORMULARY: 2.0000 mg | Freq: Every day | Status: DC

## 2017-01-16 MED ORDER — LORAZEPAM 2 MG/ML IJ SOLN: 1.0000 mg | Freq: Four times a day (QID) | INTRAMUSCULAR | Status: DC | PRN

## 2017-01-16 MED ORDER — ARIPIPRAZOLE 5 MG PO TABS
5.0000 mg | ORAL_TABLET | Freq: Every day | ORAL | Status: DC
  Administered 2017-01-16: 5 mg via ORAL
  Filled 2017-01-16 (×2): qty 1

## 2017-01-16 MED ORDER — ARIPIPRAZOLE 5 MG PO TABS
5.0000 mg | ORAL_TABLET | Freq: Every day | ORAL | Status: DC
  Administered 2017-01-16: 5 mg via ORAL
  Filled 2017-01-16 (×3): qty 1

## 2017-01-16 MED ORDER — ESZOPICLONE 2 MG PO TABS
2.0000 mg | ORAL_TABLET | Freq: Every day | ORAL | Status: DC
  Administered 2017-01-16: 2 mg via ORAL
  Filled 2017-01-16: qty 1

## 2017-01-16 MED ORDER — CLONAZEPAM 1 MG PO TABS
1.0000 mg | ORAL_TABLET | Freq: Three times a day (TID) | ORAL | Status: DC | PRN
  Administered 2017-01-16 – 2017-01-17 (×3): 1 mg via ORAL
  Filled 2017-01-16 (×3): qty 1

## 2017-01-16 MED ORDER — TRAZODONE HCL 100 MG PO TABS: 100.0000 mg | ORAL_TABLET | Freq: Every evening | ORAL | Status: DC | PRN

## 2017-01-16 MED ORDER — SERTRALINE HCL 100 MG PO TABS
100.0000 mg | ORAL_TABLET | Freq: Every day | ORAL | Status: DC
  Administered 2017-01-17: 100 mg via ORAL
  Filled 2017-01-16 (×2): qty 1

## 2017-01-16 MED ORDER — OLANZAPINE 5 MG PO TBDP: 5.0000 mg | ORAL_TABLET | Freq: Three times a day (TID) | ORAL | Status: DC | PRN

## 2017-01-16 MED ORDER — LORAZEPAM 1 MG PO TABS
1.0000 mg | ORAL_TABLET | Freq: Four times a day (QID) | ORAL | Status: DC | PRN
  Administered 2017-01-16: 1 mg via ORAL
  Filled 2017-01-16: qty 1

## 2017-01-16 NOTE — Progress Notes (Signed)
[facility] Group Notes:  (Nursing/MHT/Case Management/Adjunct)  Date:  01/16/2017  Time:  9:23 PM  Type of Therapy:  Psychoeducational Skills  Participation Level:  Active  Participation Quality:  Attentive  Affect:  Appropriate  Cognitive:  Appropriate  Insight:  Improving  Engagement in Group:  Improving  Modes of Intervention:  Education  Summary of Progress/Problems: Patient verbalized that she had a better day since she spent less time in bed. She did mention that she is concerned about the new medication that she is taking. In terms of theme for the day, her support system will be comprised of her mother, father, and brother.   Archie Balboa S 01/16/2017, 9:23 PM

## 2017-01-16 NOTE — Progress Notes (Signed)
D: Pt presents with anxious, depressed affect and mood.  Describes her day as "pretty good" and states "the Abilify is making me feel like a sloth."  Her goal was "to not stay in bed."  Pt reports she attended groups today.  She reports it was nice to "talk to my daughter."  Denies SI/HI, denies hallucinations, reports back pain of 7/10.  Pt has been visible in milieu interacting with peers and staff appropriately.    A: Met with pt and provided support and encouragement.  Medication administered per order.  PRN medication administered for anxiety and pain.  Q15 minute safety checks maintained.   R: Pt is compliant with medications.  She is safe on the unit and she verbally contracts for safety.  

## 2017-01-16 NOTE — [facility] Group Notes (Signed)
  Healthy Support Systems Date:  01/16/2017  Time:  0930  Type of Therapy:  Nurse Education  /  Healthy Support Systems :  The group is focused on teaching patients the importance of uitlizing and developing healthy support systems to maintain homeostasis in their lives.  Participation Level:  Active  Participation Quality:  Attentive  Affect:  Anxious  Cognitive:  Appropriate  Insight:  Appropriate  Engagement in Group:  Engaged  Modes of Intervention:  Education  Summary of Progress/Problems:  Lauralyn Primes 01/16/2017, 12:11 PM

## 2017-01-16 NOTE — Progress Notes (Signed)
D Waldene is seen OOB UAL on the unit today..  she is tolerating this very well today. Inititally, she was quite worried about being taken off reperidal today. ASfter meeting with the physician, and having a chance to have her questios answered and then have the answers reinforced by nursing, she was visibly able to relax, quit ruminating and actually start thinking about feelign better today. A She was able to complete her daily assessment and on it she wrote she deneid SI today and she rated her depression, hopeelssness and anxeity " 07/24/09", repsectively. R Initially, after taking the first dose of abilify, she was uncomfortable, saying " I don't like how I'm feeling.Marland Kitchenibuprofen dont like this medicine". After about 1- 11/2 hrs, pt began to get more comfortable, stated " Im feeling better now..and " I want to take it at night"..." so I wan't feel sio tired in the day"> R Safety is in place and poc cont.

## 2017-01-16 NOTE — Plan of Care (Signed)
Problem: Activity: Goal: Sleeping patterns will improve Outcome: Progressing Pt slept 6.5 hours last night according to flowsheet.    

## 2017-01-16 NOTE — BHH Suicide Risk Assessment (Signed)
[facility] Admission Suicide Risk Assessment   Nursing information obtained from:  Patient Demographic factors:  Divorced or widowed, Caucasian, Low socioeconomic status, Unemployed Current Mental Status:  Suicidal ideation indicated by patient Loss Factors:  Loss of significant relationship Historical Factors:  Anniversary of important loss Risk Reduction Factors:  Responsible for children under 49 years of age  Total Time spent with patient: 20 minutes Principal Problem: Schizoaffective disorder, depressive type ([place]) Diagnosis:   Patient Active Problem List   Diagnosis Date Noted  . Schizoaffective disorder, depressive type ([place]) [F25.1] 01/16/2017  . PTSD (post-traumatic stress disorder) [F43.10] 01/16/2017  . Panic disorder [F41.0] 01/16/2017  . Acute diverticulitis [K57.92] 04/26/2016  . Diverticulitis of large intestine with abscess without bleeding [K57.20] 04/26/2016  . Intractable migraine [G43.919] 07/27/2014   Subjective Data: Please see H&P.   Continued Clinical Symptoms:  Alcohol Use Disorder Identification Test Final Score (AUDIT): 1 The "Alcohol Use Disorders Identification Test", Guidelines for Use in Primary Care, Second Edition.  World Pharmacologist [facility]). Score between 0-7:  no or low risk or alcohol related problems. Score between 8-15:  moderate risk of alcohol related problems. Score between 16-19:  high risk of alcohol related problems. Score 20 or above:  warrants further diagnostic evaluation for alcohol dependence and treatment.   CLINICAL FACTORS:   Severe Anxiety and/or Agitation Panic Attacks Depression:   Hopelessness Impulsivity Insomnia Currently Psychotic Previous Psychiatric Diagnoses and Treatments   Musculoskeletal: Strength & Muscle Tone: within normal limits Gait & Station: normal Patient leans: N/A  Psychiatric Specialty Exam: Physical Exam  Review of Systems  Psychiatric/Behavioral: Positive for depression and hallucinations.  The patient is nervous/anxious and has insomnia.   All other systems reviewed and are negative.   Blood pressure 98/68, pulse (!) 106, temperature 97.4 F (36.3 C), temperature source Oral, resp. rate 16, height 5\' 3"  (1.6 m), weight 89.3 kg (196 lb 14.4 oz), SpO2 100 %.Body mass index is 34.88 kg/m.                    Please see H&P.                                       COGNITIVE FEATURES THAT CONTRIBUTE TO RISK:  Closed-mindedness, Polarized thinking and Thought constriction (tunnel vision)    SUICIDE RISK:   Moderate:  Frequent suicidal ideation with limited intensity, and duration, some specificity in terms of plans, no associated intent, good self-control, limited dysphoria/symptomatology, some risk factors present, and identifiable protective factors, including available and accessible social support.  PLAN OF CARE: Please see H&P.   I certify that inpatient services furnished can reasonably be expected to improve the patient's condition.   Deborah Mellinger, MD 01/16/2017, 1:35 PM
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
## 2017-01-17 DIAGNOSIS — Z8379 Family history of other diseases of the digestive system: Secondary | ICD-10-CM

## 2017-01-17 DIAGNOSIS — Z87891 Personal history of nicotine dependence: Secondary | ICD-10-CM

## 2017-01-17 LAB — LIPID PANEL
CHOLESTEROL: 176 mg/dL (ref 0–200)
HDL: 61 mg/dL (ref >40)
LDL CALC: 88 mg/dL (ref 0–99)
Total CHOL/HDL Ratio: 2.9 RATIO
Triglycerides: 137 mg/dL (ref <150)
VLDL: 27 mg/dL (ref 0–40)

## 2017-01-17 LAB — COMPREHENSIVE METABOLIC PANEL
ALBUMIN: 3.8 g/dL (ref 3.5–5.0)
ALT: 24 U/L (ref 14–54)
AST: 20 U/L (ref 15–41)
Alkaline Phosphatase: 48 U/L (ref 38–126)
Anion gap: 8 (ref 5–15)
BUN: 12 mg/dL (ref 6–20)
CHLORIDE: 106 mmol/L (ref 101–111)
CO2: 28 mmol/L (ref 22–32)
Calcium: 9.3 mg/dL (ref 8.9–10.3)
Creatinine, Ser: 0.98 mg/dL (ref 0.44–1.00)
GFR calc Af Amer: >60 mL/min (ref >60)
GFR calc non Af Amer: >60 mL/min (ref >60)
GLUCOSE: 94 mg/dL (ref 65–99)
POTASSIUM: 3.9 mmol/L (ref 3.5–5.1)
Sodium: 142 mmol/L (ref 135–145)
Total Bilirubin: 0.5 mg/dL (ref 0.3–1.2)
Total Protein: 6.3 g/dL — ABNORMAL LOW (ref 6.5–8.1)

## 2017-01-17 LAB — TSH: TSH: 0.45 u[IU]/mL (ref 0.350–4.500)

## 2017-01-17 MED ORDER — SERTRALINE HCL 50 MG PO TABS
50.0000 mg | ORAL_TABLET | Freq: Every day | ORAL | Status: DC
  Administered 2017-01-18: 50 mg via ORAL
  Filled 2017-01-17 (×3): qty 1

## 2017-01-17 MED ORDER — CLONAZEPAM 0.5 MG PO TABS
0.5000 mg | ORAL_TABLET | Freq: Three times a day (TID) | ORAL | Status: DC | PRN
  Administered 2017-01-17 – 2017-01-18 (×3): 0.5 mg via ORAL
  Filled 2017-01-17 (×4): qty 1

## 2017-01-17 MED ORDER — ARIPIPRAZOLE 10 MG PO TABS
10.0000 mg | ORAL_TABLET | Freq: Every day | ORAL | Status: DC
  Administered 2017-01-17: 10 mg via ORAL
  Filled 2017-01-17 (×3): qty 1

## 2017-01-17 MED ORDER — ESZOPICLONE 2 MG PO TABS
2.0000 mg | ORAL_TABLET | Freq: Every evening | ORAL | Status: DC | PRN
  Administered 2017-01-17 – 2017-01-20 (×4): 2 mg via ORAL
  Filled 2017-01-17 (×4): qty 1

## 2017-01-17 NOTE — [facility] Group Notes (Signed)
Adult Psychoeducational Group Note  Date:  01/17/2017 Time:  10:45 AM  Group Topic/Focus:  Goals Group:   The focus of this group is to help patients establish daily goals to achieve during treatment and discuss how the patient can incorporate goal setting into their daily lives to aide in recovery.  Participation Level:  Active  Participation Quality:  Resistant  Affect:  Depressed and Tearful  Cognitive:  Appropriate  Insight: Appropriate and Good  Engagement in Group:  Resistant  Modes of Intervention:  Discussion  Additional Comments:  Pt stated her day was a 36 but dropped down to a two because she begin to think about her divorce. She stated that she felt very abandoned by her husband and he made her feel like it was her fault.  Deborah Pena A 01/17/2017, 10:45 AM

## 2017-01-17 NOTE — Plan of Care (Signed)
Problem: Education: Goal: Emotional status will improve Outcome: Not Progressing Patient is tearful and depressed.  She continues to ruminate on her ex-husband.

## 2017-01-17 NOTE — Progress Notes (Signed)
Recreation Therapy Notes  Date: 01/17/17 Time: 0930 Location: [address] Group Room  Group Topic: Stress Management  Goal Area(s) Addresses:  Patient will verbalize importance of using healthy stress management.  Patient will identify positive emotions associated with healthy stress management.   Intervention: Stress Management  Activity :  Daily Calm.  LRT introduced the stress management technique of meditation to the group.  LRT played a meditation from the Calm App to allow patients the opportunity to engage in the meditation.  Patients were to follow along with the meditation to fully engage in the technique.  Education:  Stress Management, Discharge Planning.   Education Outcome: Acknowledges edcuation/In group clarification offered/Needs additional education  Clinical Observations/Feedback: Pt did not attend group.    [name], LRT/CTRS         [name] A 01/17/2017 12:24 PM

## 2017-01-17 NOTE — Progress Notes (Signed)
D: Patient has been extremely tearful today.  She has been ruminating on her soon to be ex-husband.  Patient is going through a divorce and keeps stating, "he's going so well.  He's trying to call my daughter.  He got the house because I couldn't pay for it.  He makes six figures.  I stayed at home and home schooled our daughter.  I never worked."  Patient states that she was diagnosed with schizophrenia and states that she "hears a circus in her head."  Patient states that the ex-husband blames her "for everyone."  She denies any thoughts of self harm.  She rates her depression as a 5; hopelessness as an 8.  She is anxious and has sad, depressed mood.  Patient also states she thinks the medication is making her feel this way.  She states, "something's wrong.  I shouldn't be feeling this bad."   A: Continue to monitor medication management and MD orders.  Safety checks completed every 15 minutes per protocol.  Offer support and encouragement as needed. R: Patient is receptive to staff; her behavior is appropriate to situation.

## 2017-01-17 NOTE — Progress Notes (Signed)
[place] Group Notes:  (Nursing/MHT/Case Management/Adjunct)  Date:  01/17/2017  Time:  10:26 PM  Type of Therapy:  Psychoeducational Skills  Participation Level:  Active  Participation Quality:  Attentive  Affect:  Depressed  Cognitive:  Appropriate  Insight:  Improving  Engagement in Group:  Resistant  Modes of Intervention:  Education  Summary of Progress/Problems: Patient described her day as having been "okay" but then acknowledged that she isn't feeling well enough to be discharged. She stated that she needs to get her "head together" and to learn more from this hospitalization. In terms of the theme for the day, her wellness strategy will be to "stop ruminating" about her issues with her husband.  Archie Balboa S 01/17/2017, 10:26 PM

## 2017-01-17 NOTE — BHH Group Notes (Signed)
[place] LCSW Group Therapy  01/17/2017 2:30 PM  Type of Therapy:  Group Therapy  Participation Level:  Did not attend  Modes of Intervention:  Discussion, Education, Socialization and Support  Summary of Progress/Problems:Balance in life: Patients will discuss the concept of balance and how it looks and feels to be unbalanced. Pt will identify areas in their life that is unbalanced and ways to become more balanced.    [place] MSW, [place]  01/17/2017, 2:30 PM

## 2017-01-17 NOTE — Progress Notes (Addendum)
[facility] MD Progress Note  01/17/2017 10:46 AM Deborah Pena  MRN:  [id number] Subjective:  Patient states she is feeling significantly better than on admission. At this time denies medication side effects. Objective : I have discussed case with treatment team and have met with patient. As per staff, chart notes, patient has presented anxious , but has reported improvement of symptoms. She has tended to remain ruminative about psychosocial stressors, divorce.  She has been visible on unit, going to groups, no disruptive or agitated behaviors . Patient reports partial improvement compared to how she felt prior to admission. States she remains anxious ,but to a lesser degree of severity, and states she feels that her anxiety was partially related to akathisia from Risperidone, which has now been D/Cd. Of note, has been started on Abilify , which she is tolerating well - no current restlessness or akathisia described or noted. Denies any psychotic symptoms.Does not appear internally preoccupied . At this time she is not presenting with any symptoms of WDL- no tremors, no diaphoresis, vitals stable. Denies medication side effects at present . Principal Problem: Schizoaffective disorder, depressive type ([place]) Diagnosis:   Patient Active Problem List   Diagnosis Date Noted  . Schizoaffective disorder, depressive type ([place]) [F25.1] 01/16/2017  . PTSD (post-traumatic stress disorder) [F43.10] 01/16/2017  . Panic disorder [F41.0] 01/16/2017  . Acute diverticulitis [K57.92] 04/26/2016  . Diverticulitis of large intestine with abscess without bleeding [K57.20] 04/26/2016  . Intractable migraine [G43.919] 07/27/2014   Total Time spent with patient: 20 minutes  Past Medical History:  Past Medical History:  Diagnosis Date  . Anemia   . Anginal pain ([place])   . Anxiety   . Arthritis    "left hand" (04/26/2016)  . Asthma   . Chronic lower back pain   . Diverticulitis of colon   . GERD (gastroesophageal  reflux disease)   . Headache    'once or twice/week" (04/26/2016)  . Migraine    "twice/month" (04/26/2016)  . Stomach ulcer     Past Surgical History:  Procedure Laterality Date  . ABDOMINAL HYSTERECTOMY  2013   preformed at [facility] by Dr Jacqualyn Posey in April  . BREAST SURGERY Bilateral ~ 2000   "dual lateral duct removal"   . BUNIONECTOMY Right 2013  . CESAREAN SECTION  1999  . OOPHORECTOMY Right 2013  . TONSILLECTOMY     Family History:  Family History  Problem Relation Age of Onset  . Heart failure Father   . Diabetes Other   . Breast cancer Other   . Diverticulitis Other   . Mental illness Paternal Grandmother   . Mental illness Paternal Grandfather   . Schizophrenia Paternal Aunt   . Suicidality Maternal Aunt    Social History:  History  Alcohol Use No     History  Drug Use No    Social History   Social History  . Marital status: Married    Spouse name: N/A  . Number of children: N/A  . Years of education: N/A   Social History Main Topics  . Smoking status: Former Smoker    Packs/day: 2.00    Years: 30.00    Types: Cigarettes  . Smokeless tobacco: Former Systems developer    Types: Chew     Comment: "quit smoking cigarettes in ~ 2011; chewed when I was little"  . Alcohol use No  . Drug use: No  . Sexual activity: Not Currently   Other Topics Concern  . None   Social History Narrative  .  None   Additional Social History:   Sleep: improved   Appetite:  Good  Current Medications: Current Facility-Administered Medications  Medication Dose Route Frequency Provider Last Rate Last Dose  . acetaminophen (TYLENOL) tablet 650 mg  650 mg Oral Q6H PRN Patrecia Pour, NP   650 mg at 01/16/17 1949  . alum & mag hydroxide-simeth (MAALOX/MYLANTA) 200-200-20 MG/5ML suspension 30 mL  30 mL Oral Q4H PRN Patrecia Pour, NP      . ARIPiprazole (ABILIFY) tablet 10 mg  10 mg Oral QHS Myer Peer Travez Stancil, MD      . clonazePAM (KLONOPIN) tablet 1 mg  1 mg Oral TID PRN Ursula Alert, MD   1 mg at 01/17/17 0841  . eszopiclone (LUNESTA) tablet 2 mg  2 mg Oral QHS Jenne Campus, MD   2 mg at 01/16/17 2104  . gabapentin (NEURONTIN) capsule 300 mg  300 mg Oral TID Patrecia Pour, NP   300 mg at 01/17/17 0400  . hydrOXYzine (ATARAX/VISTARIL) tablet 25 mg  25 mg Oral TID PRN Patrecia Pour, NP   25 mg at 01/15/17 2047  . magnesium hydroxide (MILK OF MAGNESIA) suspension 30 mL  30 mL Oral Daily PRN Patrecia Pour, NP      . nicotine (NICODERM CQ - dosed in mg/24 hours) patch 21 mg  21 mg Transdermal Q0600 Jenne Campus, MD   21 mg at 01/17/17 0843  . OLANZapine zydis (ZYPREXA) disintegrating tablet 5 mg  5 mg Oral TID PRN Ursula Alert, MD      . Derrill Memo ON 01/18/2017] sertraline (ZOLOFT) tablet 50 mg  50 mg Oral Daily Jenne Campus, MD      . traZODone (DESYREL) tablet 100 mg  100 mg Oral QHS PRN Ursula Alert, MD        Lab Results:  Results for orders placed or performed during the hospital encounter of 01/15/17 (from the past 48 hour(s))  TSH     Status: None   Collection Time: 01/17/17  6:07 AM  Result Value Ref Range   TSH 0.450 0.350 - 4.500 uIU/mL    Comment: Performed by a 3rd Generation assay with a functional sensitivity of <=0.01 uIU/mL. Performed at [facility], [place] [address], [place], [place] [address]   Lipid panel     Status: None   Collection Time: 01/17/17  6:07 AM  Result Value Ref Range   Cholesterol 176 0 - 200 mg/dL   Triglycerides 137 <150 mg/dL   HDL 61 >40 mg/dL   Total CHOL/HDL Ratio 2.9 RATIO   VLDL 27 0 - 40 mg/dL   LDL Cholesterol 88 0 - 99 mg/dL    Comment:        Total Cholesterol/HDL:CHD Risk Coronary Heart Disease Risk Table                     Men   Women  1/2 Average Risk   3.4   3.3  Average Risk       5.0   4.4  2 X Average Risk   9.6   7.1  3 X Average Risk  23.4   11.0        Use the calculated Patient Ratio above and the CHD Risk Table to determine the patient's CHD Risk.        ATP  III CLASSIFICATION (LDL):  <100     mg/dL   Optimal  100-129  mg/dL  Near or Above                    Optimal  130-159  mg/dL   Borderline  160-189  mg/dL   High  >190     mg/dL   Very High Performed at [facility] [address], [place], [place] [address]   Comprehensive metabolic panel     Status: Abnormal   Collection Time: 01/17/17  6:07 AM  Result Value Ref Range   Sodium 142 135 - 145 mmol/L   Potassium 3.9 3.5 - 5.1 mmol/L   Chloride 106 101 - 111 mmol/L   CO2 28 22 - 32 mmol/L   Glucose, Bld 94 65 - 99 mg/dL   BUN 12 6 - 20 mg/dL   Creatinine, Ser 0.98 0.44 - 1.00 mg/dL   Calcium 9.3 8.9 - 10.3 mg/dL   Total Protein 6.3 (L) 6.5 - 8.1 g/dL   Albumin 3.8 3.5 - 5.0 g/dL   AST 20 15 - 41 U/L   ALT 24 14 - 54 U/L   Alkaline Phosphatase 48 38 - 126 U/L   Total Bilirubin 0.5 0.3 - 1.2 mg/dL   GFR calc non Af Amer >60 >60 mL/min   GFR calc Af Amer >60 >60 mL/min    Comment: (NOTE) The eGFR has been calculated using the CKD EPI equation. This calculation has not been validated in all clinical situations. eGFR's persistently <60 mL/min signify possible Chronic Kidney Disease.    Anion gap 8 5 - 15    Comment: Performed at [facility], [place] [address], [place],  [address]    Blood Alcohol level:  Lab Results  Component Value Date   ETH 48 (H) 01/14/2017   ETH <11 27/25/3664    Metabolic Disorder Labs: Lab Results  Component Value Date   HGBA1C 5.1 08/21/2012   No results found for: PROLACTIN Lab Results  Component Value Date   CHOL 176 01/17/2017   TRIG 137 01/17/2017   HDL 61 01/17/2017   CHOLHDL 2.9 01/17/2017   VLDL 27 01/17/2017   LDLCALC 88 01/17/2017    Physical Findings: AIMS: Facial and Oral Movements Muscles of Facial Expression: None, normal Lips and Perioral Area: None, normal Jaw: Minimal Tongue: Minimal,Extremity Movements Upper (arms, wrists, hands, fingers): None, normal Lower (legs, knees, ankles,  toes): Mild, Trunk Movements Neck, shoulders, hips: None, normal, Overall Severity Severity of abnormal movements (highest score from questions above): Minimal Incapacitation due to abnormal movements: None, normal Patient's awareness of abnormal movements (rate only patient's report): Aware, moderate distress, Dental Status Current problems with teeth and/or dentures?: No Does patient usually wear dentures?: No  CIWA:    COWS:     Musculoskeletal: Strength & Muscle Tone: within normal limits Gait & Station: normal Patient leans: N/A  Psychiatric Specialty Exam: Physical Exam  ROS no chest pain, no shortness of breath, no vomiting, no chills, no fever  Blood pressure 136/79, pulse 74, temperature 98.9 F (37.2 C), temperature source Oral, resp. rate 16, height 5' 3"  (1.6 m), weight 89.3 kg (196 lb 14.4 oz), SpO2 100 %.Body mass index is 34.88 kg/m.  General Appearance: improved grooming   Eye Contact:  Good  Speech:  Normal Rate  Volume:  Normal  Mood:  improving   Affect:  partially improved, less anxious, less labile  Thought Process:  Linear  Orientation:  Full (Time, Place, and Person)  Thought Content:  denies hallucinations, no delusions, not internally  preoccupied   Suicidal Thoughts:  No denies any suicidal or self injurious ideations, denies any homicidal or violent ideations and specifically also denies any violent ideations towards husband, from whom she is currently divorcing .  Homicidal Thoughts:  No  Memory:  recent and remote grossly intact  Judgement:  Other:  improving   Insight:  improving   Psychomotor Activity:  Normal- no tremors, no diaphoresis  Concentration:  Concentration: Good and Attention Span: Good  Recall:  Good  Fund of Knowledge:  Good  Language:  Good  Akathisia:  Negative  Handed:  Right  AIMS (if indicated):     Assets:  Financial Resources/Insurance Resilience  ADL's:  Intact  Cognition:  WNL  Sleep:  Number of Hours: 6.75    Assessment - 51 year old female, presented to ED due to worsening anxiety, depression, SI. Recent significant stressors, going through divorce.Of note , was on Ambien, Xanax, Adderall, Risperidone prior to admission , admission BAL was 48, but denies history of alcohol abuse . At this time significantly improved, feeling better, denies SI. Not presenting with any WDL symptoms at this time, more future oriented, denies medication side effects. On Abilify- no akathisia at present .Patient states she has been on Zoloft in the past, does not remember having had any side effects, feels that it helped   Treatment Plan Summary: Daily contact with patient to assess and evaluate symptoms and progress in treatment, Medication management, Plan inpatient admission and medications as below Encourage ongoing group and milieu participation to work on coping skills and symptom reduction Change Abilify to 10 mgrs QHS,for mood disorder, augmentation - patient states she prefers QHS dosing Continue Lunesta 2 mgrs QHS PRN for insomnia Decrease  Klonopin to 0.5  mgr TID PRN for anxiety- have reviewed rationale to minimize potentially habit forming medications and potential drug drug interactions.  Continue Neurontin 300 mgrs TID for anxiety Start Zoloft  50 mgrs QDAY for depression, anxiety D/C Trazodone   Treatment team working on disposition planning options   Neita Garnet, MD 01/17/2017, 10:46 AM

## 2017-01-18 LAB — HEMOGLOBIN A1C
HEMOGLOBIN A1C: 4.8 % (ref 4.8–5.6)
MEAN PLASMA GLUCOSE: 91 mg/dL

## 2017-01-18 LAB — PROLACTIN: PROLACTIN: 192 ng/mL — AB (ref 4.8–23.3)

## 2017-01-18 MED ORDER — LURASIDONE HCL 40 MG PO TABS
40.0000 mg | ORAL_TABLET | Freq: Every day | ORAL | Status: DC
Start: 2017-01-19 — End: 2017-01-18

## 2017-01-18 MED ORDER — LURASIDONE HCL 20 MG PO TABS
20.0000 mg | ORAL_TABLET | Freq: Every day | ORAL | Status: DC
  Administered 2017-01-19 – 2017-01-20 (×2): 20 mg via ORAL
  Filled 2017-01-18 (×4): qty 1

## 2017-01-18 MED ORDER — SERTRALINE HCL 25 MG PO TABS
75.0000 mg | ORAL_TABLET | Freq: Every day | ORAL | Status: DC
  Administered 2017-01-19 – 2017-01-21 (×3): 75 mg via ORAL
  Filled 2017-01-18 (×6): qty 3

## 2017-01-18 MED ORDER — LORAZEPAM 0.5 MG PO TABS
0.5000 mg | ORAL_TABLET | Freq: Four times a day (QID) | ORAL | Status: DC | PRN
  Administered 2017-01-18 – 2017-01-20 (×6): 0.5 mg via ORAL
  Filled 2017-01-18 (×6): qty 1

## 2017-01-18 NOTE — BHH Group Notes (Signed)
[place] LCSW Group Therapy 01/18/2017 1:15 PM  Type of Therapy: Group Therapy- Feelings about Diagnosis  Participation Level: Minimal  Participation Quality:  N/A  Affect:  Flat  Cognitive: Alert and Oriented   Insight: Unable to assess  Engagement in Therapy: Limited   Modes of Intervention: Clarification, Confrontation, Discussion, Education, Exploration, Limit-setting, Orientation, Problem-solving, Rapport Building, Art therapist, Socialization and Support  Description of Group:   This group will allow patients to explore their thoughts and feelings about diagnoses they have received. Patients will be guided to explore their level of understanding and acceptance of these diagnoses. Facilitator will encourage patients to process their thoughts and feelings about the reactions of others to their diagnosis, and will guide patients in identifying ways to discuss their diagnosis with significant others in their lives. This group will be process-oriented, with patients participating in exploration of their own experiences as well as giving and receiving support and challenge from other group members.  Summary of Progress/Problems:  Pt did not participate in group discussion but appeared to be attentive throughout.  Therapeutic Modalities:   Cognitive Behavioral Therapy Solution Focused Therapy Motivational Interviewing Relapse Prevention Therapy  Adriana Reams, LCSW 01/18/2017 3:36 PM

## 2017-01-18 NOTE — Tx Team (Signed)
Interdisciplinary Treatment and Diagnostic Plan Update  01/18/2017 Time of Session: 1:13 PM  Deborah Pena MRN: [id number]  Principal Diagnosis: Schizoaffective disorder, depressive type [facility])  Secondary Diagnoses: Principal Problem:   Schizoaffective disorder, depressive type ([place]) Active Problems:   PTSD (post-traumatic stress disorder)   Panic disorder   Current Medications:  Current Facility-Administered Medications  Medication Dose Route Frequency Provider Last Rate Last Dose  . acetaminophen (TYLENOL) tablet 650 mg  650 mg Oral Q6H PRN Patrecia Pour, NP   650 mg at 01/16/17 1949  . alum & mag hydroxide-simeth (MAALOX/MYLANTA) 200-200-20 MG/5ML suspension 30 mL  30 mL Oral Q4H PRN Patrecia Pour, NP      . ARIPiprazole (ABILIFY) tablet 10 mg  10 mg Oral QHS Jenne Campus, MD   10 mg at 01/17/17 2114  . clonazePAM (KLONOPIN) tablet 0.5 mg  0.5 mg Oral TID PRN Jenne Campus, MD   0.5 mg at 01/18/17 1303  . eszopiclone (LUNESTA) tablet 2 mg  2 mg Oral QHS PRN Jenne Campus, MD   2 mg at 01/17/17 2136  . gabapentin (NEURONTIN) capsule 300 mg  300 mg Oral TID Patrecia Pour, NP   300 mg at 01/18/17 1301  . hydrOXYzine (ATARAX/VISTARIL) tablet 25 mg  25 mg Oral TID PRN Patrecia Pour, NP   25 mg at 01/15/17 2047  . magnesium hydroxide (MILK OF MAGNESIA) suspension 30 mL  30 mL Oral Daily PRN Patrecia Pour, NP      . nicotine (NICODERM CQ - dosed in mg/24 hours) patch 21 mg  21 mg Transdermal Q0600 Jenne Campus, MD   21 mg at 01/18/17 7341  . OLANZapine zydis (ZYPREXA) disintegrating tablet 5 mg  5 mg Oral TID PRN Ursula Alert, MD      . sertraline (ZOLOFT) tablet 50 mg  50 mg Oral Daily Jenne Campus, MD   50 mg at 01/18/17 9379    PTA Medications: Prescriptions Prior to Admission  Medication Sig Dispense Refill Last Dose  . acetaminophen (TYLENOL) 325 MG tablet Take 650 mg by mouth every 6 (six) hours as needed.   Past Week at Unknown time  . risperiDONE  (RISPERDAL) 3 MG tablet Take 9 mg by mouth at bedtime.   01/13/2017 at Unknown time    Treatment Modalities: Medication Management, Group therapy, Case management,  1 to 1 session with clinician, Psychoeducation, Recreational therapy.  Patient Stressors: Financial difficulties Loss of  relationship  Patient Strengths: Average or above average intelligence Capable of independent living General fund of knowledge Motivation for treatment/growth  Physician Treatment Plan for Primary Diagnosis: Schizoaffective disorder, depressive type ([place]) Long Term Goal(s): Improvement in symptoms so as ready for discharge  Short Term Goals: Ability to verbalize feelings will improve Compliance with prescribed medications will improve Ability to verbalize feelings will improve Compliance with prescribed medications will improve  Medication Management: Evaluate patient's response, side effects, and tolerance of medication regimen.  Therapeutic Interventions: 1 to 1 sessions, Unit Group sessions and Medication administration.  Evaluation of Outcomes: Not Met  Physician Treatment Plan for Secondary Diagnosis: Principal Problem:   Schizoaffective disorder, depressive type ([place]) Active Problems:   PTSD (post-traumatic stress disorder)   Panic disorder   Long Term Goal(s): Improvement in symptoms so as ready for discharge  Short Term Goals: Ability to verbalize feelings will improve Compliance with prescribed medications will improve Ability to verbalize feelings will improve Compliance with prescribed medications will improve  Medication Management:  Evaluate patient's response, side effects, and tolerance of medication regimen.  Therapeutic Interventions: 1 to 1 sessions, Unit Group sessions and Medication administration.  Evaluation of Outcomes: Not Met   RN Treatment Plan for Primary Diagnosis: Schizoaffective disorder, depressive type ([place]) Long Term Goal(s): Knowledge of disease and  therapeutic regimen to maintain health will improve  Short Term Goals: Ability to verbalize feelings will improve, Ability to disclose and discuss suicidal ideas and Ability to identify and develop effective coping behaviors will improve  Medication Management: RN will administer medications as ordered by provider, will assess and evaluate patient's response and provide education to patient for prescribed medication. RN will report any adverse and/or side effects to prescribing provider.  Therapeutic Interventions: 1 on 1 counseling sessions, Psychoeducation, Medication administration, Evaluate responses to treatment, Monitor vital signs and CBGs as ordered, Perform/monitor CIWA, COWS, AIMS and Fall Risk screenings as ordered, Perform wound care treatments as ordered.  Evaluation of Outcomes: Not Met   LCSW Treatment Plan for Primary Diagnosis: Schizoaffective disorder, depressive type ([place]) Long Term Goal(s): Safe transition to appropriate next level of care at discharge, Engage patient in therapeutic group addressing interpersonal concerns.  Short Term Goals: Engage patient in aftercare planning with referrals and resources, Identify triggers associated with mental health/substance abuse issues and Increase skills for wellness and recovery  Therapeutic Interventions: Assess for all discharge needs, 1 to 1 time with Social worker, Explore available resources and support systems, Assess for adequacy in community support network, Educate family and significant other(s) on suicide prevention, Complete Psychosocial Assessment, Interpersonal group therapy.  Evaluation of Outcomes: Not Met   Progress in Treatment: Attending groups: Yes Participating in groups: Intermittently Taking medication as prescribed: Yes, MD continues to assess for medication changes as needed Toleration medication: Yes, no side effects reported at this time Family/Significant other contact made: No, CSW attempting to  make contact with daughter Patient understands diagnosis: Continuing to assess Discussing patient identified problems/goals with staff: Yes Medical problems stabilized or resolved: Yes Denies suicidal/homicidal ideation: Yes Issues/concerns per patient self-inventory: None Other: N/A  New problem(s) identified: None identified at this time.   New Short Term/Long Term Goal(s): None identified at this time.   Discharge Plan or Barriers: Pt will return home and follow-up with outpatient services.   Reason for Continuation of Hospitalization: Anxiety Depression Medication stabilization  Estimated Length of Stay: 2-4 days  Attendees: Patient: 01/18/2017  1:13 PM  Physician: Dr. Parke Poisson 01/18/2017  1:13 PM  Nursing: Mayra Neer, Desma Paganini, RN 01/18/2017  1:13 PM  RN Care Manager: Lars Pinks, RN 01/18/2017  1:13 PM  Social Worker: Adriana Reams, LCSW; [place], LCSW 01/18/2017  1:13 PM  Recreational Therapist:  01/18/2017  1:13 PM  Other: Lindell Spar, NP; Samuel Jester, NP 01/18/2017  1:13 PM  Other:  01/18/2017  1:13 PM  Other: 01/18/2017  1:13 PM    Scribe for Treatment Team: Gladstone Lighter, LCSW 01/18/2017 1:13 PM

## 2017-01-18 NOTE — Progress Notes (Signed)
D: Patient continues to ruminate on her marriage.  She wrote on her self inventory that she was "great!"  She reports that she is sleeping and eating well.  She talked at length with the chaplin.  She states, "he's very empathetic to my situation.  I finally got to tell the whole story to someone."  Patient denies any thoughts of self harm.  Her goal today is to "talk to my daughter and talk to Dr. Parke Poisson."  She reports her depression and anxiety as an 8; hopelessness as a 3. A: Continue to monitor medication management and MD orders.  Safety checks completed every 15 minutes per protocol.  Offer support and encouragement as needed. R: Patient is receptive to staff; her behavior is appropriate.

## 2017-01-18 NOTE — BHH Group Notes (Signed)
Adult Psychoeducational Group Note  Date:  01/18/2017 Time:  11:16 PM  Group Topic/Focus:  Wrap-Up Group:   The focus of this group is to help patients review their daily goal of treatment and discuss progress on daily workbooks.  Participation Level:  Active  Participation Quality:  Appropriate  Affect:  Appropriate  Cognitive:  Oriented  Insight: Good  Engagement in Group:  Engaged  Modes of Intervention:  Activity  Additional Comments:  Patient rated her day a 10. Goal is to work on communication with family and friends.  Gloriajean Dell Lareen Mullings 01/18/2017, 11:16 PM

## 2017-01-18 NOTE — Progress Notes (Signed)
D: Pt at the time of assessment was alert and oriented x 4. Pt was flat and withdrawn to self even while in the dayroom. Pt endorses severe anxiety with moderate depression; states, "I anxiety meds that I'm getting here is lower than what I take at home; they aren't working." Pt at this time however, denies SI, HI, pain or AVH. A: Medications offered as prescribed. Support, encouragement, and safe environment provided.  15-minute safety checks continue. R: Pt was med compliant.  Pt attended wrap-up group. Safety checks continue.

## 2017-01-18 NOTE — Progress Notes (Signed)
[facility] MD Progress Note  01/18/2017 2:54 PM Saryn Cherry  MRN:  [id number] Subjective:  Patient states that today she is having a worse day than yesterday. She is feeling more depressed. She also reports ongoing hallucinations, which she describes as "seeing and hearing people". Of note, states that these are not new, and have been occurring intermittently for years. Patient feels that Abilify is not well tolerated and " is actually making me feel worse ".  Of note, denies any suicidal ideations, contracts for safety on unit . Objective : I have discussed case with treatment team and have met with patient. Patient reports worsening mood, increased depression today. Yesterday had been feeling better. She cannot identify any specific stressor to explain why today she is feeling worse. She has been going to some groups, although as discussed with staff has tended to be passive, with limited participation. No disruptive or agitated behaviors on unit. Of note, patient denies any clear medication side effects but states she feels new medication ( Abilify) is causing her to feel worse. Of note, she had been on Zoloft in the past, and felt it did help partially and did not have side effects. Patient reports she had been on Risperidone 9 mgrs daily for years , up to recently , and that it " worked for a long time but then just stopped working" due to which she stopped it. Labs reviewed- of note, Prolactin is quite elevated ( 192). Patient denies galactorrhea, amenorrhea- has history of hysterectomy. Denies headaches, visual field changes. Likely associated with chronic antipsychotic management .  Principal Problem: Schizoaffective disorder, depressive type ([place]) Diagnosis:   Patient Active Problem List   Diagnosis Date Noted  . Schizoaffective disorder, depressive type ([place]) [F25.1] 01/16/2017  . PTSD (post-traumatic stress disorder) [F43.10] 01/16/2017  . Panic disorder [F41.0] 01/16/2017  . Acute  diverticulitis [K57.92] 04/26/2016  . Diverticulitis of large intestine with abscess without bleeding [K57.20] 04/26/2016  . Intractable migraine [G43.919] 07/27/2014   Total Time spent with patient: 20 minutes  Past Medical History:  Past Medical History:  Diagnosis Date  . Anemia   . Anginal pain ([place])   . Anxiety   . Arthritis    "left hand" (04/26/2016)  . Asthma   . Chronic lower back pain   . Diverticulitis of colon   . GERD (gastroesophageal reflux disease)   . Headache    'once or twice/week" (04/26/2016)  . Migraine    "twice/month" (04/26/2016)  . Stomach ulcer     Past Surgical History:  Procedure Laterality Date  . ABDOMINAL HYSTERECTOMY  2013   preformed at [facility] by Dr Jacqualyn Posey in April  . BREAST SURGERY Bilateral ~ 2000   "dual lateral duct removal"   . BUNIONECTOMY Right 2013  . CESAREAN SECTION  1999  . OOPHORECTOMY Right 2013  . TONSILLECTOMY     Family History:  Family History  Problem Relation Age of Onset  . Heart failure Father   . Diabetes Other   . Breast cancer Other   . Diverticulitis Other   . Mental illness Paternal Grandmother   . Mental illness Paternal Grandfather   . Schizophrenia Paternal Aunt   . Suicidality Maternal Aunt    Social History:  History  Alcohol Use No     History  Drug Use No    Social History   Social History  . Marital status: Married    Spouse name: N/A  . Number of children: N/A  . Years of education:  N/A   Social History Main Topics  . Smoking status: Former Smoker    Packs/day: 2.00    Years: 30.00    Types: Cigarettes  . Smokeless tobacco: Former Systems developer    Types: Chew     Comment: "quit smoking cigarettes in ~ 2011; chewed when I was little"  . Alcohol use No  . Drug use: No  . Sexual activity: Not Currently   Other Topics Concern  . None   Social History Narrative  . None   Additional Social History:   Sleep: improved   Appetite:  Good  Current Medications: Current  Facility-Administered Medications  Medication Dose Route Frequency Provider Last Rate Last Dose  . acetaminophen (TYLENOL) tablet 650 mg  650 mg Oral Q6H PRN Patrecia Pour, NP   650 mg at 01/16/17 1949  . alum & mag hydroxide-simeth (MAALOX/MYLANTA) 200-200-20 MG/5ML suspension 30 mL  30 mL Oral Q4H PRN Patrecia Pour, NP      . eszopiclone Johnnye Sima) tablet 2 mg  2 mg Oral QHS PRN Jenne Campus, MD   2 mg at 01/17/17 2136  . hydrOXYzine (ATARAX/VISTARIL) tablet 25 mg  25 mg Oral TID PRN Patrecia Pour, NP   25 mg at 01/15/17 2047  . LORazepam (ATIVAN) tablet 0.5 mg  0.5 mg Oral Q6H PRN Jenne Campus, MD      . Derrill Memo ON 01/19/2017] lurasidone (LATUDA) tablet 40 mg  40 mg Oral Q breakfast Myer Peer , MD      . magnesium hydroxide (MILK OF MAGNESIA) suspension 30 mL  30 mL Oral Daily PRN Patrecia Pour, NP      . nicotine (NICODERM CQ - dosed in mg/24 hours) patch 21 mg  21 mg Transdermal Q0600 Jenne Campus, MD   21 mg at 01/18/17 8768  . [START ON 01/19/2017] sertraline (ZOLOFT) tablet 75 mg  75 mg Oral Daily Jenne Campus, MD        Lab Results:  Results for orders placed or performed during the hospital encounter of 01/15/17 (from the past 48 hour(s))  TSH     Status: None   Collection Time: 01/17/17  6:07 AM  Result Value Ref Range   TSH 0.450 0.350 - 4.500 uIU/mL    Comment: Performed by a 3rd Generation assay with a functional sensitivity of <=0.01 uIU/mL. Performed at [facility], [place] [address] [place], [place] [address]   Lipid panel     Status: None   Collection Time: 01/17/17  6:07 AM  Result Value Ref Range   Cholesterol 176 0 - 200 mg/dL   Triglycerides 137 <150 mg/dL   HDL 61 >40 mg/dL   Total CHOL/HDL Ratio 2.9 RATIO   VLDL 27 0 - 40 mg/dL   LDL Cholesterol 88 0 - 99 mg/dL    Comment:        Total Cholesterol/HDL:CHD Risk Coronary Heart Disease Risk Table                     Men   Women  1/2 Average Risk   3.4   3.3  Average Risk        5.0   4.4  2 X Average Risk   9.6   7.1  3 X Average Risk  23.4   11.0        Use the calculated Patient Ratio above and the CHD Risk Table to determine the patient's CHD Risk.  ATP III CLASSIFICATION (LDL):  <100     mg/dL   Optimal  100-129  mg/dL   Near or Above                    Optimal  130-159  mg/dL   Borderline  160-189  mg/dL   High  >190     mg/dL   Very High Performed at [place] [address] [place], [place] [address]   Hemoglobin A1c     Status: None   Collection Time: 01/17/17  6:07 AM  Result Value Ref Range   Hgb A1c MFr Bld 4.8 4.8 - 5.6 %    Comment: (NOTE)         Pre-diabetes: 5.7 - 6.4         Diabetes: >6.4         Glycemic control for adults with diabetes: <7.0    Mean Plasma Glucose 91 mg/dL    Comment: (NOTE) Performed At: [facility] [place], [place] [id number] Lindon Romp MD [id number] Performed at [facility], [place] [address] [place], [place] [address]   Prolactin     Status: Abnormal   Collection Time: 01/17/17  6:07 AM  Result Value Ref Range   Prolactin 192.0 (H) 4.8 - 23.3 ng/mL    Comment: (NOTE) Performed At: [facility] [place], [place] [id number] Lindon Romp MD [id number] Performed at [facility], [place] [address], [place], [place] [address]   Comprehensive metabolic panel     Status: Abnormal   Collection Time: 01/17/17  6:07 AM  Result Value Ref Range   Sodium 142 135 - 145 mmol/L   Potassium 3.9 3.5 - 5.1 mmol/L   Chloride 106 101 - 111 mmol/L   CO2 28 22 - 32 mmol/L   Glucose, Bld 94 65 - 99 mg/dL   BUN 12 6 - 20 mg/dL   Creatinine, Ser 0.98 0.44 - 1.00 mg/dL   Calcium 9.3 8.9 - 10.3 mg/dL   Total Protein 6.3 (L) 6.5 - 8.1 g/dL   Albumin 3.8 3.5 - 5.0 g/dL   AST 20 15 - 41 U/L   ALT 24 14 - 54 U/L   Alkaline Phosphatase 48 38 - 126 U/L   Total Bilirubin 0.5 0.3 - 1.2 mg/dL   GFR calc non  Af Amer >60 >60 mL/min   GFR calc Af Amer >60 >60 mL/min    Comment: (NOTE) The eGFR has been calculated using the CKD EPI equation. This calculation has not been validated in all clinical situations. eGFR's persistently <60 mL/min signify possible Chronic Kidney Disease.    Anion gap 8 5 - 15    Comment: Performed at [facility], [place] [address], [place], [place] [address]    Blood Alcohol level:  Lab Results  Component Value Date   ETH 48 (H) 01/14/2017   ETH <11 91/79/1505    Metabolic Disorder Labs: Lab Results  Component Value Date   HGBA1C 4.8 01/17/2017   MPG 91 01/17/2017   Lab Results  Component Value Date   PROLACTIN 192.0 (H) 01/17/2017   Lab Results  Component Value Date   CHOL 176 01/17/2017   TRIG 137 01/17/2017   HDL 61 01/17/2017   CHOLHDL 2.9 01/17/2017   VLDL 27 01/17/2017   LDLCALC 88 01/17/2017    Physical Findings: AIMS: Facial and Oral Movements Muscles of Facial Expression: None, normal  Lips and Perioral Area: None, normal Jaw: Minimal Tongue: Minimal,Extremity Movements Upper (arms, wrists, hands, fingers): None, normal Lower (legs, knees, ankles, toes): Mild, Trunk Movements Neck, shoulders, hips: None, normal, Overall Severity Severity of abnormal movements (highest score from questions above): Minimal Incapacitation due to abnormal movements: None, normal Patient's awareness of abnormal movements (rate only patient's report): Aware, moderate distress, Dental Status Current problems with teeth and/or dentures?: No Does patient usually wear dentures?: No  CIWA:    COWS:     Musculoskeletal: Strength & Muscle Tone: within normal limits Gait & Station: normal Patient leans: N/A  Psychiatric Specialty Exam: Physical Exam  ROS no headache, no visual disturbances reported, no chest pain, no shortness of breath, no vomiting, no chills, no fever  Blood pressure 101/72, pulse (!) 111, temperature 98.9 F (37.2 C),  temperature source Oral, resp. rate 18, height 5' 3" (1.6 m), weight 89.3 kg (196 lb 14.4 oz), SpO2 100 %.Body mass index is 34.88 kg/m.  General Appearance: improved grooming   Eye Contact:  Good  Speech:  Normal Rate  Volume:  Normal  Mood: today reports worsening depression, sadness   Affect:  Constricted, not tearful  Thought Process:  Linear  Orientation:  Full (Time, Place, and Person)  Thought Content: reports chronic hallucinations, both visual and auditory,no delusions expressed, not internally preoccupied at this time  Suicidal Thoughts:  No denies any suicidal or self injurious plans or intentions and contracts for safety on unit   Homicidal Thoughts:  No  Memory:  recent and remote grossly intact  Judgement:  Other:  improving   Insight:  improving   Psychomotor Activity:  Normal- no tremors, no diaphoresis  Concentration:  Concentration: Good and Attention Span: Good  Recall:  Good  Fund of Knowledge:  Good  Language:  Good  Akathisia:  Negative  Handed:  Right  AIMS (if indicated):   no abnormal or involuntary movements noted or reported  Assets:  Financial Resources/Insurance Resilience  ADL's:  Intact  Cognition:  WNL  Sleep:  Number of Hours: 6.75   Assessment - patient reports a feeling of worsening depression today, which she attributes to new medications, mainly Abilify. She does report chronic , ongoing hallucinatory experiences, but does not appear internally preoccupied at this time. Denies feeling suicidal and contracts for safety at this time. Labs are remarkable for very elevated Prolactin, but denies symptoms associated with this, and gives a history of chronic and ongoing antipsychotic management  Treatment Plan Summary: Daily contact with patient to assess and evaluate symptoms and progress in treatment, Medication management, Plan inpatient admission and medications as below Encourage ongoing group and milieu participation to work on coping skills and  symptom reduction D/C Abilify- see above D/C Neurontin- as may be temporally related with feeling more depressed  We discussed options- patient does warrant antipsychotic management due to chronic mood and psychotic symptoms- she does not want medication often associated with weight gain. Agrees to LATUDA trial. Start Latuda 20 mgrs QDAY initially . Continue Lunesta 2 mgrs QHS PRN for insomnia D/C Klonopin PRNs Start Ativan 0.5 mgrs Q 6 hours PRN for anxiety as needed  Increase  Zoloft  To 75 mgrs QDAY for depression, anxiety Will discuss prolactin elevation with colleagues, treatment team, pharmacist, in order to determine if any medication addition or adjustment is warranted . As noted, patient denies associated symptoms at this time Treatment team working on disposition planning options   Neita Garnet, MD 01/18/2017, 2:54 PM  Patient ID: Avanti Jetter, female   DOB: 03-28-1966, 51 y.o.   MRN: [id number]

## 2017-01-18 NOTE — Plan of Care (Signed)
Problem: Self-Concept: Goal: Level of anxiety will decrease Outcome: Progressing Pt reports decreased anxiety after taking PRN medication for anxiety earlier tonight.

## 2017-01-18 NOTE — Progress Notes (Signed)
D: Pt presents with depressed, anxious affect and mood.  Upon initial approach, pt's eyes are red and it appears like she was crying earlier.  She reports her day was "good, I talked to the pastor and that was great, I was finally able to get my whole story out."  Pt reports she and peers had a "prayer session and that was awesome."  Her goal is to "to talk, to feel good about talking to people."  Denies SI/HI, denies hallucinations, reports back pain of 8/10.  She has been visible in milieu; attended evening group.  A: Met with pt and offered support and encouragement.  Actively listened to pt.  PRN medication administered for pain, sleep, and anxiety.  Q15 minute safety checks maintained.    R: Pt is compliant with medications.  She is safe on the unit and she verbally contracts for safety.

## 2017-01-19 MED ORDER — DIVALPROEX SODIUM ER 250 MG PO TB24
250.0000 mg | ORAL_TABLET | Freq: Every day | ORAL | Status: DC
  Administered 2017-01-19 – 2017-01-20 (×2): 250 mg via ORAL
  Filled 2017-01-19 (×5): qty 1

## 2017-01-19 NOTE — BHH Suicide Risk Assessment (Signed)
[place] INPATIENT:  Family/Significant Other Suicide Prevention Education  Suicide Prevention Education:  Contact Attempts: Laretta Mcanelly, Pt's daughter [phone], has been identified by the patient as the family member/significant other with whom the patient will be residing, and identified as the person(s) who will aid the patient in the event of a mental health crisis.  With written consent from the patient, two attempts were made to provide suicide prevention education, prior to and/or following the patient's discharge.  We were unsuccessful in providing suicide prevention education.  A suicide education pamphlet was given to the patient to share with family/significant other.  Date and time of first attempt: 01/18/17 @ 3:30pm Date and time of second attempt: 01/19/17 @ 4:00pm  [place] Lighter 01/19/2017, 4:00 PM

## 2017-01-19 NOTE — Progress Notes (Signed)
D: Pt was in bed in her room upon initial approach.  She presents with anxious, depressed affect and mood.  Pt reports her day was "okay" and her goal was to "talk to my daughter."  She reports she met her goal.  Pt states "I'm getting a little tired of being in here."  She report she would like to "just take my medication and sleep tonight."  Denies SI/HI, denies hallucinations, denies pain.  Pt has isolated to her room tonight and she did not attend evening group.   A: Met with pt and offered support and encouragement.  PRN medication administered for sleep and anxiety.  Q15 minute safety checks maintained.   R: Pt is compliant with medication.  She is safe on the unit and she verbally contracts for safety.

## 2017-01-19 NOTE — Progress Notes (Signed)
Psychoeducational Group Note  Date:  01/19/2017 Time:  2157  Group Topic/Focus:  Wrap-Up Group:   The focus of this group is to help patients review their daily goal of treatment and discuss progress on daily workbooks.  Participation Level: Did Not Attend  Participation Quality:  Not Applicable  Affect:  Not Applicable  Cognitive:  Not Applicable  Insight:  Not Applicable  Engagement in Group: Not Applicable  Additional Comments:  The patient did not attend group since she was asleep at that time.   Archie Balboa S 01/19/2017, 9:57 PM

## 2017-01-19 NOTE — BHH Group Notes (Signed)
Pt attended spiritual care group on grief and loss facilitated by chaplain Jerene Pitch   Group opened with brief discussion and psycho-social ed around grief and loss in relationships and in relation to self - identifying life patterns, circumstances, changes that cause losses. Established group norm of speaking from own life experience. Group goal of establishing open and affirming space for members to share loss and experience with grief, normalize grief experience and provide psycho social education and grief support.   Deborah Pena was present throughout group.  Alert and oriented x4.  Deborah Pena was attentive and showed affirmation throughout group by head nods.  As group was talking about "honoring" as a part of grief process  (both those they have lost and themselves after they have been through a transition), Deborah Pena stated "there are some people who do not need to be honored.  The group affirmed this.  Facilitator opened space for her to speak about her experience and she stated she was not able to.  She was tearful through the rest of group, but remained attentive.  Other group members continued to offer Deborah Pena affirmation and support.    After group, facilitator checked in with Deborah Pena and she asked to follow up in individual time.      [place], [place]

## 2017-01-19 NOTE — BHH Group Notes (Signed)
[place] LCSW Group Therapy 01/19/2017 1:15 PM  Type of Therapy: Group Therapy- Emotion Regulation  Participation Level: Minimal  Participation Quality:  N/A  Affect:  Flat  Cognitive: Alert and Oriented   Insight: Unable to assess  Engagement in Therapy: Limited   Modes of Intervention: Clarification, Confrontation, Discussion, Education, Exploration, Limit-setting, Orientation, Problem-solving, Rapport Building, Art therapist, Socialization and Support  Summary of Progress/Problems: The topic for group today was emotional regulation. This group focused on both positive and negative emotion identification and allowed group members to process ways to identify feelings, regulate negative emotions, and find healthy ways to manage internal/external emotions. Group members were asked to reflect on a time when their reaction to an emotion led to a negative outcome and explored how alternative responses using emotion regulation would have benefited them. Group members were also asked to discuss a time when emotion regulation was utilized when a negative emotion was experienced. Pt did not participate in group discussion but remained attentive throughout.    Adriana Reams, LCSW 01/19/2017 3:59 PM

## 2017-01-19 NOTE — Progress Notes (Addendum)
[facility] MD Progress Note  01/19/2017 4:52 PM Deborah Pena  MRN:  [id number] Subjective:  Patient reports ongoing symptoms of anxiety and mood disorder . She feels there has been partial improvement compared to admission, but states she still feels far from baseline and feels " right now I don't think I could function well if I were discharged ". Reports ongoing brief but subjectively significant mood swings. Denies suicidal ideations, also states her chronic visual hallucinations ( states she sees human figures, people) have decreased and has had none over the last couple of days. We have reviewed medication options, treatment. Patient states that in the past Depakote ER was very effective, and states " I felt better on it, and stopped it because I was better"( rather than due to a side effect) . Objective : I have discussed case with treatment team and have met with patient. Patient remains anxious, labile, apprehensive. She is medication focused. As above reports history of depression, chronic hallucinations, and today also stresses short lived but intense mood swings. She states symptoms exacerbated in the context of her severe psychosocial stressors, mainly divorce, and subsequent financial difficulties. As above, expresses interest in Depakote trial, which she states was helpful and well tolerated during a prior trial. She denies other medication side effects and is not presenting with akathisia or sedation. Prolactin very elevated, likely related to Risperidone, which she was taking up to 9 mgrs a day and which she recently stopped .  Some group participation, staff reports patient remains medication focused, anxious, needing reassurance and support from staff at times.   Principal Problem: Schizoaffective disorder, depressive type ([place]) Diagnosis:   Patient Active Problem List   Diagnosis Date Noted  . Schizoaffective disorder, depressive type ([place]) [F25.1] 01/16/2017  . PTSD (post-traumatic  stress disorder) [F43.10] 01/16/2017  . Panic disorder [F41.0] 01/16/2017  . Acute diverticulitis [K57.92] 04/26/2016  . Diverticulitis of large intestine with abscess without bleeding [K57.20] 04/26/2016  . Intractable migraine [G43.919] 07/27/2014   Total Time spent with patient: 20 minutes  Past Medical History:  Past Medical History:  Diagnosis Date  . Anemia   . Anginal pain ([place])   . Anxiety   . Arthritis    "left hand" (04/26/2016)  . Asthma   . Chronic lower back pain   . Diverticulitis of colon   . GERD (gastroesophageal reflux disease)   . Headache    'once or twice/week" (04/26/2016)  . Migraine    "twice/month" (04/26/2016)  . Stomach ulcer     Past Surgical History:  Procedure Laterality Date  . ABDOMINAL HYSTERECTOMY  2013   preformed at [facility] by Dr Jacqualyn Posey in April  . BREAST SURGERY Bilateral ~ 2000   "dual lateral duct removal"   . BUNIONECTOMY Right 2013  . CESAREAN SECTION  1999  . OOPHORECTOMY Right 2013  . TONSILLECTOMY     Family History:  Family History  Problem Relation Age of Onset  . Heart failure Father   . Diabetes Other   . Breast cancer Other   . Diverticulitis Other   . Mental illness Paternal Grandmother   . Mental illness Paternal Grandfather   . Schizophrenia Paternal Aunt   . Suicidality Maternal Aunt    Social History:  History  Alcohol Use No     History  Drug Use No    Social History   Social History  . Marital status: Married    Spouse name: N/A  . Number of children: N/A  . Years  of education: N/A   Social History Main Topics  . Smoking status: Former Smoker    Packs/day: 2.00    Years: 30.00    Types: Cigarettes  . Smokeless tobacco: Former Systems developer    Types: Chew     Comment: "quit smoking cigarettes in ~ 2011; chewed when I was little"  . Alcohol use No  . Drug use: No  . Sexual activity: Not Currently   Other Topics Concern  . None   Social History Narrative  . None   Additional Social History:    Sleep: improved   Appetite:  Good  Current Medications: Current Facility-Administered Medications  Medication Dose Route Frequency Provider Last Rate Last Dose  . acetaminophen (TYLENOL) tablet 650 mg  650 mg Oral Q6H PRN Patrecia Pour, NP   650 mg at 01/18/17 2103  . alum & mag hydroxide-simeth (MAALOX/MYLANTA) 200-200-20 MG/5ML suspension 30 mL  30 mL Oral Q4H PRN Patrecia Pour, NP      . divalproex (DEPAKOTE ER) 24 hr tablet 250 mg  250 mg Oral Daily Myer Peer , MD      . eszopiclone (LUNESTA) tablet 2 mg  2 mg Oral QHS PRN Jenne Campus, MD   2 mg at 01/18/17 2103  . LORazepam (ATIVAN) tablet 0.5 mg  0.5 mg Oral Q6H PRN Jenne Campus, MD   0.5 mg at 01/19/17 1427  . lurasidone (LATUDA) tablet 20 mg  20 mg Oral Q breakfast Jenne Campus, MD   20 mg at 01/19/17 0807  . magnesium hydroxide (MILK OF MAGNESIA) suspension 30 mL  30 mL Oral Daily PRN Patrecia Pour, NP      . nicotine (NICODERM CQ - dosed in mg/24 hours) patch 21 mg  21 mg Transdermal Q0600 Jenne Campus, MD   21 mg at 01/19/17 0808  . sertraline (ZOLOFT) tablet 75 mg  75 mg Oral Daily Jenne Campus, MD   75 mg at 01/19/17 9373    Lab Results:  No results found for this or any previous visit (from the past 48 hour(s)).  Blood Alcohol level:  Lab Results  Component Value Date   ETH 48 (H) 01/14/2017   ETH <11 42/87/6811    Metabolic Disorder Labs: Lab Results  Component Value Date   HGBA1C 4.8 01/17/2017   MPG 91 01/17/2017   Lab Results  Component Value Date   PROLACTIN 192.0 (H) 01/17/2017   Lab Results  Component Value Date   CHOL 176 01/17/2017   TRIG 137 01/17/2017   HDL 61 01/17/2017   CHOLHDL 2.9 01/17/2017   VLDL 27 01/17/2017   LDLCALC 88 01/17/2017    Physical Findings: AIMS: Facial and Oral Movements Muscles of Facial Expression: None, normal Lips and Perioral Area: None, normal Jaw: Minimal Tongue: Minimal,Extremity Movements Upper (arms, wrists, hands,  fingers): None, normal Lower (legs, knees, ankles, toes): Mild, Trunk Movements Neck, shoulders, hips: None, normal, Overall Severity Severity of abnormal movements (highest score from questions above): Minimal Incapacitation due to abnormal movements: None, normal Patient's awareness of abnormal movements (rate only patient's report): Aware, moderate distress, Dental Status Current problems with teeth and/or dentures?: No Does patient usually wear dentures?: No  CIWA:    COWS:     Musculoskeletal: Strength & Muscle Tone: within normal limits Gait & Station: normal Patient leans: N/A  Psychiatric Specialty Exam: Physical Exam  ROS no headache, no visual disturbances reported, no chest pain, no shortness of breath, no vomiting, no  chills, no fever  Blood pressure 124/83, pulse (!) 104, temperature 98.6 F (37 C), temperature source Oral, resp. rate 18, height 5' 3" (1.6 m), weight 89.3 kg (196 lb 14.4 oz), SpO2 100 %.Body mass index is 34.88 kg/m.  General Appearance: improved grooming   Eye Contact:  Good  Speech:  Normal Rate  Volume:  Normal  Mood: remains depressed, anxious   Affect:  Anxious, sad  Thought Process:  Linear  Orientation:  Full (Time, Place, and Person)  Thought Content: reports chronic hallucinations, both visual and auditory, but states none over the last 2 days and does not appear internally preoccupied at this time. Does not express delusional ideations at this time.  Suicidal Thoughts:  No denies any suicidal or self injurious plans or intentions and contracts for safety on unit   Homicidal Thoughts:  No  Memory:  recent and remote grossly intact  Judgement:  Other:  improving   Insight:  improving   Psychomotor Activity:  Normal- no tremors, no diaphoresis  Concentration:  Concentration: Good and Attention Span: Good  Recall:  Good  Fund of Knowledge:  Good  Language:  Good  Akathisia:  Negative  Handed:  Right  AIMS (if indicated):   no abnormal or  involuntary movements noted or reported  Assets:  Desire for Improvement Resilience  ADL's:  Intact  Cognition:  WNL  Sleep:  Number of Hours: 6.5   Assessment - patient remains depressed, labile, anxious, but denying any suicidal ideations, and reporting no further hallucinations over the last 2 days. She feels that [place] is well tolerated thus far, and reports a history of good response to Zoloft and to Depakote ER . In fact states she felt stable with Depakote management and stopped it because she was stable.  Prolactin very elevated, but no associated symptoms reported. Likely is result of antipsychotic medication ( high dose Risperidone prior to admission) , agrees to continue [place]. Due to how high Prolactin serum level is, I will order a head MRI, although she is denying any CNS symptoms ( to rule out potential hypophyseal etiology)   Treatment Plan Summary: Daily contact with patient to assess and evaluate symptoms and progress in treatment, Medication management, Plan inpatient admission and medications as below Encourage ongoing group and milieu participation to work on coping skills and symptom reduction Continue  Latuda 20 mgrs QDAY for mood disorder, psychosis. Continue Lunesta 2 mgrs QHS PRN for insomnia Continue  Ativan 0.5 mgrs Q 6 hours PRN for anxiety as needed  Continue  Zoloft 75 mgrs QDAY for depression, anxiety Start Depakote ER 250 mgrs QDAY for mood disorder, see above  Check Head MRI- see rationale above - patient denies any metallic implants , no pacemaker.   Neita Garnet, MD 01/19/2017, 4:52 PM   Patient ID: Deborah Pena, female   DOB: 09-Feb-1966, 51 y.o.   MRN: [id number]

## 2017-01-19 NOTE — Progress Notes (Signed)
Recreation Therapy Notes  Date: 01/19/17 Time: 0930 Location: 300 Hall Group Room  Group Topic: Stress Management  Goal Area(s) Addresses:  Patient will verbalize importance of using healthy stress management.  Patient will identify positive emotions associated with healthy stress management.   Intervention: Stress Management  Activity :  Guided Imagery.  LRT introduced the stress management technique of guided imagery.  LRT read a script to allow patients the opportunity to engage in the activity.  Patients were to follow along as LRT read script.  Education:  Stress Management, Discharge Planning.   Education Outcome: Acknowledges edcuation/In group clarification offered/Needs additional education  Clinical Observations/Feedback: Pt did not attend group.   [name], LRT/CTRS         [name] A 01/19/2017 3:04 PM

## 2017-01-19 NOTE — Plan of Care (Signed)
Problem: Activity: Goal: Interest or engagement in activities will improve Outcome: Not Progressing Pt has stayed in her room for the majority of the night.  She did not attend evening group and she reported she wanted to sleep instead.

## 2017-01-19 NOTE — Progress Notes (Addendum)
D: Patient appears anxious and on the verge of tears this morning.  She requested her latuda early stating, "I'm supposed to take it now."  Patient was also focused on her ativan.  Patient was hovering around the nurses station and was told that she could not stand at the counter.  Patient was anxious and was told that medications have not been pulled yet.  Patient has an intense gaze and has bizzare behaviors.  She denies any thoughts of self harm.  She continues to ruminate on her failed marriage. Patient also appears to be med seeking. A: Continue to monitor medication management and MD orders.  Safety checks completed every 15 minutes per protocol.  Offer support and encouragement as needed. R: Patient is receptive to staff; her behavior is appropriate to situation.

## 2017-01-20 MED ORDER — DIVALPROEX SODIUM ER 500 MG PO TB24
500.0000 mg | ORAL_TABLET | Freq: Every day | ORAL | Status: DC
Start: 2017-01-21 — End: 2017-01-21
  Filled 2017-01-20 (×2): qty 1

## 2017-01-20 MED ORDER — CLONAZEPAM 0.5 MG PO TABS
0.5000 mg | ORAL_TABLET | Freq: Two times a day (BID) | ORAL | Status: DC | PRN
  Administered 2017-01-20 – 2017-01-21 (×4): 0.5 mg via ORAL
  Filled 2017-01-20 (×4): qty 1

## 2017-01-20 MED ORDER — LURASIDONE HCL 20 MG PO TABS
20.0000 mg | ORAL_TABLET | Freq: Every day | ORAL | Status: DC
  Filled 2017-01-20 (×2): qty 1

## 2017-01-20 MED ORDER — CLONAZEPAM 0.5 MG PO TABS: 0.5000 mg | ORAL_TABLET | Freq: Three times a day (TID) | ORAL | Status: DC | PRN

## 2017-01-20 NOTE — Progress Notes (Signed)
D: Viveka denied SI, HI, and AVH. She's been anxious and concerned about her medications today, particularly antianxiety medication. On her self-inventory form, she reported poor sleep, good appetite, low energy, and poor concentration. She also rated her depression 7/10, hopelessness 6/10, and anxiety 10/10. She was discouraged that her MRI was rescheduled. She is hoping to discharge tomorrow.  A: Meds given as ordered, including PRNs. Pt said Ativan was not helpful, and Dr. Parke Poisson switched to Klonopin. Q15 safety checks maintained. Support/encouragement offered. R: Pt remains free from harm and continues with treatment. Will continue to monitor for needs/safety.

## 2017-01-20 NOTE — Progress Notes (Signed)
Adult Psychoeducational Group Note  Date:  01/20/2017 Time:  8:39 PM  Group Topic/Focus:  Wrap-Up Group:   The focus of this group is to help patients review their daily goal of treatment and discuss progress on daily workbooks.  Participation Level:  Active  Participation Quality:  Appropriate  Affect:  Appropriate  Cognitive:  Appropriate  Insight: Appropriate  Engagement in Group:  Engaged  Modes of Intervention:  Discussion  Additional Comments:  Pt stated she has been tired today. Pt stated she was able talk to the doctor and her daughter and she is supposed to be going home tomorrow. Pt was encouraged to make her needs known to staff.  Clint Bolder 01/20/2017, 8:39 PM

## 2017-01-20 NOTE — Progress Notes (Addendum)
[facility] MD Progress Note  01/20/2017 3:15 PM Deborah Pena  MRN:  [id number] Subjective:  Patient reports partial improvement compared to yesterday. States she feels vaguely anxious, but overall reports improvement . She feels medications are well tolerated, although worries that current  vague sense anxiety may be partially related to akathisia, which she has experienced with other antipsychotic medications in the past . As she improves she is focusing more on discharge, and states she is missing seeing her daughter and is hoping for discharge home soon. Objective : I have discussed case with treatment team and have met with patient. Patient presents significantly  improved compared to admission. She presents improved, affect seems more reactive  .  She does endorse feeling anxious. She reports that chronic hallucinations, which have been occurring for years, have subsided, and at this time denies active psychotic symptoms, no delusions are expressed, does not appear internally preoccupied. No disruptive or agitated behaviors on unit, going to some groups. At this time tolerating medications well - of note, no akathisia noted, was able to sit comfortably during session without psychomotor restlessness of fidgeting. Denies any suicidal ideations    Principal Problem: Schizoaffective disorder, depressive type ([place]) Diagnosis:   Patient Active Problem List   Diagnosis Date Noted  . Schizoaffective disorder, depressive type ([place]) [F25.1] 01/16/2017  . PTSD (post-traumatic stress disorder) [F43.10] 01/16/2017  . Panic disorder [F41.0] 01/16/2017  . Acute diverticulitis [K57.92] 04/26/2016  . Diverticulitis of large intestine with abscess without bleeding [K57.20] 04/26/2016  . Intractable migraine [G43.919] 07/27/2014   Total Time spent with patient: 20 minutes  Past Medical History:  Past Medical History:  Diagnosis Date  . Anemia   . Anginal pain ([place])   . Anxiety   . Arthritis    "left  hand" (04/26/2016)  . Asthma   . Chronic lower back pain   . Diverticulitis of colon   . GERD (gastroesophageal reflux disease)   . Headache    'once or twice/week" (04/26/2016)  . Migraine    "twice/month" (04/26/2016)  . Stomach ulcer     Past Surgical History:  Procedure Laterality Date  . ABDOMINAL HYSTERECTOMY  2013   preformed at [facility] by Dr Jacqualyn Posey in April  . BREAST SURGERY Bilateral ~ 2000   "dual lateral duct removal"   . BUNIONECTOMY Right 2013  . CESAREAN SECTION  1999  . OOPHORECTOMY Right 2013  . TONSILLECTOMY     Family History:  Family History  Problem Relation Age of Onset  . Heart failure Father   . Diabetes Other   . Breast cancer Other   . Diverticulitis Other   . Mental illness Paternal Grandmother   . Mental illness Paternal Grandfather   . Schizophrenia Paternal Aunt   . Suicidality Maternal Aunt    Social History:  History  Alcohol Use No     History  Drug Use No    Social History   Social History  . Marital status: Married    Spouse name: N/A  . Number of children: N/A  . Years of education: N/A   Social History Main Topics  . Smoking status: Former Smoker    Packs/day: 2.00    Years: 30.00    Types: Cigarettes  . Smokeless tobacco: Former Systems developer    Types: Chew     Comment: "quit smoking cigarettes in ~ 2011; chewed when I was little"  . Alcohol use No  . Drug use: No  . Sexual activity: Not Currently  Other Topics Concern  . None   Social History Narrative  . None   Additional Social History:   Sleep: improved   Appetite:  Good  Current Medications: Current Facility-Administered Medications  Medication Dose Route Frequency Provider Last Rate Last Dose  . acetaminophen (TYLENOL) tablet 650 mg  650 mg Oral Q6H PRN Patrecia Pour, NP   650 mg at 01/18/17 2103  . alum & mag hydroxide-simeth (MAALOX/MYLANTA) 200-200-20 MG/5ML suspension 30 mL  30 mL Oral Q4H PRN Patrecia Pour, NP      . Derrill Memo ON 01/21/2017] divalproex  (DEPAKOTE ER) 24 hr tablet 500 mg  500 mg Oral QHS Myer Peer Jadeyn Hargett, MD      . eszopiclone (LUNESTA) tablet 2 mg  2 mg Oral QHS PRN Jenne Campus, MD   2 mg at 01/19/17 2110  . LORazepam (ATIVAN) tablet 0.5 mg  0.5 mg Oral Q6H PRN Jenne Campus, MD   0.5 mg at 01/20/17 1208  . [START ON 01/21/2017] lurasidone (LATUDA) tablet 20 mg  20 mg Oral QHS Lunden Mcleish A Braxley Balandran, MD      . magnesium hydroxide (MILK OF MAGNESIA) suspension 30 mL  30 mL Oral Daily PRN Patrecia Pour, NP      . nicotine (NICODERM CQ - dosed in mg/24 hours) patch 21 mg  21 mg Transdermal Q0600 Jenne Campus, MD   21 mg at 01/20/17 6967  . sertraline (ZOLOFT) tablet 75 mg  75 mg Oral Daily Jenne Campus, MD   75 mg at 01/20/17 8938    Lab Results:  No results found for this or any previous visit (from the past 48 hour(s)).  Blood Alcohol level:  Lab Results  Component Value Date   ETH 48 (H) 01/14/2017   ETH <11 10/05/5101    Metabolic Disorder Labs: Lab Results  Component Value Date   HGBA1C 4.8 01/17/2017   MPG 91 01/17/2017   Lab Results  Component Value Date   PROLACTIN 192.0 (H) 01/17/2017   Lab Results  Component Value Date   CHOL 176 01/17/2017   TRIG 137 01/17/2017   HDL 61 01/17/2017   CHOLHDL 2.9 01/17/2017   VLDL 27 01/17/2017   LDLCALC 88 01/17/2017    Physical Findings: AIMS: Facial and Oral Movements Muscles of Facial Expression: None, normal Lips and Perioral Area: None, normal Jaw: Minimal Tongue: Minimal,Extremity Movements Upper (arms, wrists, hands, fingers): None, normal Lower (legs, knees, ankles, toes): None, normal, Trunk Movements Neck, shoulders, hips: None, normal, Overall Severity Severity of abnormal movements (highest score from questions above): Minimal Incapacitation due to abnormal movements: None, normal Patient's awareness of abnormal movements (rate only patient's report): Aware, moderate distress, Dental Status Current problems with teeth and/or dentures?:  No Does patient usually wear dentures?: No  CIWA:    COWS:     Musculoskeletal: Strength & Muscle Tone: within normal limits Gait & Station: normal Patient leans: N/A  Psychiatric Specialty Exam: Physical Exam  ROS no headache, no visual disturbances reported, no chest pain, no shortness of breath, no vomiting, no chills, no fever  Blood pressure (!) 132/92, pulse (!) 102, temperature 98.5 F (36.9 C), temperature source Oral, resp. rate 18, height 5' 3"  (1.6 m), weight 89.3 kg (196 lb 14.4 oz), SpO2 100 %.Body mass index is 34.88 kg/m.  General Appearance: improved grooming   Eye Contact:  Good  Speech:  Normal Rate  Volume:  Normal  Mood: partially improved mood and range of affect  Affect:  Improving, remains anxious  Thought Process:  Linear  Orientation:  Full (Time, Place, and Person)  Thought Content:at this time not internally preoccupied, no delusions, no current hallucinations  Suicidal Thoughts:  No denies any suicidal or self injurious plans or intentions and contracts for safety on unit   Homicidal Thoughts:  No  Memory:  recent and remote grossly intact  Judgement:  Other:  improving   Insight:  improving   Psychomotor Activity:  Normal- no tremors, no diaphoresis  Concentration:  Concentration: Good and Attention Span: Good  Recall:  Good  Fund of Knowledge:  Good  Language:  Good  Akathisia:  Negative  Handed:  Right  AIMS (if indicated):   no abnormal or involuntary movements noted or reported  Assets:  Desire for Improvement Resilience  ADL's:  Intact  Cognition:  WNL  Sleep:  Number of Hours: 4.5   Assessment - patient is presenting with overall improvement, and in particular mood is improved and reports hallucinations have resolved . As she improves she is becoming more future oriented, and is focusing more on discharging soon. She reports some increased anxiety. In the past has had akathisia with other antipsychotic trials, but at this time does not  present psycho-motorically agitated or restless . Patient requests to change Ativan to Klonopin, which she feels is more effective. Agrees to continue Latuda trial at present, as feels it is helping .  Head MRI has been ordered due to significantly elevated Prolactin- denies associated symptoms. Prolactin may likely be elevated from recent high dose Risperidone management, but head MRI warranted to rule out other etiology.  Treatment Plan Summary: Daily contact with patient to assess and evaluate symptoms and progress in treatment, Medication management, Plan inpatient admission and medications as below Encourage ongoing group and milieu participation to work on coping skills and symptom reduction Continue  Latuda 20 mgrs QDAY for mood disorder, psychosis.( Change dosing to QHS)  Continue Lunesta 2 mgrs QHS PRN for insomnia D/C Ativan - see above  Start Klonopin 0.5 mgrs Q 12 hours PRN for anxiety  Continue Zoloft 75 mgrs QDAY for depression, anxiety Increase Depakote ER to 500 mgrs QHS  for mood disorder, see above  Check Head MRI- currently scheduled for tomorrow - patient denies any metallic implants , no pacemaker.  Treatment team working on disposition planning options   Neita Garnet, MD 01/20/2017, 3:15 PM   Patient ID: Jory Welke, female   DOB: 06-18-1966, 51 y.o.

## 2017-01-20 NOTE — [facility] Group Notes (Signed)
[facility] Group Therapy 01/20/2017 1:15pm  Type of Therapy: [facility] Presentation  Pt did not attend, declined invitation.   Adriana Reams, LCSW 01/20/2017 1:25 PM

## 2017-01-20 NOTE — Progress Notes (Signed)
Pt's MRI was rescheduled for 2/2 at 1100.

## 2017-01-20 NOTE — Progress Notes (Signed)
D: Pt presents with anxious, depressed affect and mood.  She reports she is "tired."  Pt reports her goal was to "get an MRI but it'll be tomorrow at 1100."  She reports she plans to discharge after she gets MRI.  Pt reports she feels safe to discharge and she is "ready to go."  Denies SI/HI, denies hallucinations, denies pain.  Pt has been visible in milieu and she attended evening group.  A: Met with pt and offered support and encouragement.  PRN medication administered for sleep and anxiety.  Q15 minute safety checks maintained.  R: Pt is compliant with medications.  She is safe on the unit and she verbally contracts for safety.

## 2017-01-21 ENCOUNTER — Inpatient Hospital Stay (HOSPITAL_COMMUNITY): Payer: Managed Care, Other (non HMO)

## 2017-01-21 MED ORDER — SERTRALINE HCL 100 MG PO TABS
100.0000 mg | ORAL_TABLET | Freq: Every day | ORAL | Status: DC
  Filled 2017-01-21 (×2): qty 1

## 2017-01-21 MED ORDER — ACETAMINOPHEN 325 MG PO TABS: 650.0000 mg | ORAL_TABLET | Freq: Four times a day (QID) | ORAL | Status: DC | PRN

## 2017-01-21 MED ORDER — HYDROXYZINE HCL 25 MG PO TABS
ORAL_TABLET | ORAL | Status: AC
  Administered 2017-01-21: 25 mg via ORAL
  Filled 2017-01-21: qty 1

## 2017-01-21 MED ORDER — ESZOPICLONE 2 MG PO TABS: 2.0000 mg | ORAL_TABLET | Freq: Every evening | ORAL | 0 refills | Status: DC | PRN

## 2017-01-21 MED ORDER — CLONAZEPAM 0.5 MG PO TABS: 0.5000 mg | ORAL_TABLET | Freq: Two times a day (BID) | ORAL | 0 refills | Status: DC | PRN

## 2017-01-21 MED ORDER — GADOBENATE DIMEGLUMINE 529 MG/ML IV SOLN
10.0000 mL | Freq: Once | INTRAVENOUS | Status: AC | PRN
  Administered 2017-01-21: 9 mL via INTRAVENOUS
  Filled 2017-01-21: qty 10

## 2017-01-21 MED ORDER — NICOTINE 21 MG/24HR TD PT24: 21.0000 mg | MEDICATED_PATCH | Freq: Every day | TRANSDERMAL | 0 refills | Status: DC

## 2017-01-21 MED ORDER — SERTRALINE HCL 100 MG PO TABS: 100.0000 mg | ORAL_TABLET | Freq: Every day | ORAL | 0 refills | Status: DC

## 2017-01-21 MED ORDER — SERTRALINE HCL 25 MG PO TABS: 75.0000 mg | ORAL_TABLET | Freq: Every day | ORAL | 0 refills | Status: DC

## 2017-01-21 MED ORDER — HYDROXYZINE HCL 25 MG PO TABS: 25.0000 mg | ORAL_TABLET | Freq: Three times a day (TID) | ORAL | 0 refills | Status: DC | PRN

## 2017-01-21 MED ORDER — TRAZODONE HCL 50 MG PO TABS: 50.0000 mg | ORAL_TABLET | Freq: Every evening | ORAL | Status: DC | PRN

## 2017-01-21 MED ORDER — DIVALPROEX SODIUM ER 500 MG PO TB24: 500.0000 mg | ORAL_TABLET | Freq: Every day | ORAL | 0 refills | Status: DC

## 2017-01-21 MED ORDER — HYDROXYZINE HCL 25 MG PO TABS
25.0000 mg | ORAL_TABLET | Freq: Three times a day (TID) | ORAL | Status: DC | PRN
  Administered 2017-01-21: 25 mg via ORAL

## 2017-01-21 MED ORDER — TRAZODONE HCL 50 MG PO TABS: 50.0000 mg | ORAL_TABLET | Freq: Every evening | ORAL | 0 refills | Status: DC | PRN

## 2017-01-21 MED ORDER — LURASIDONE HCL 20 MG PO TABS: 20.0000 mg | ORAL_TABLET | Freq: Every day | ORAL | 0 refills | Status: DC

## 2017-01-21 NOTE — Progress Notes (Signed)
Writer and pt discussed pt dc instructions. Pt states verbal understanding and willingness to comply. And also is given cc of dc instructions reviewed ( AVS, SRA, transition record and SSP). ALl belongings are returned to pt. Pt completed daily assessment prior to dc and on it she wrote she has experienced SI today  But she contracts for safety willingly with  this Probation officer when she is questioned. She rates her depression, hopelessness and anxetiy " 07/24/09", respectively. She is worried about ehr medication regimen and Dr. Parke Poisson changed her lunesta; discontinuing it and replacing it with lunesta QHS and po vistaril, for anxiety. She is escorted to bldg entrance and dc'd amb.

## 2017-01-21 NOTE — [facility] Suicide Risk Assessment (Addendum)
[facility] Discharge Suicide Risk Assessment   Principal Problem: Schizoaffective disorder, depressive type [facility]) Discharge Diagnoses:  Patient Active Problem List   Diagnosis Date Noted  . Schizoaffective disorder, depressive type ([place]) [F25.1] 01/16/2017  . PTSD (post-traumatic stress disorder) [F43.10] 01/16/2017  . Panic disorder [F41.0] 01/16/2017  . Acute diverticulitis [K57.92] 04/26/2016  . Diverticulitis of large intestine with abscess without bleeding [K57.20] 04/26/2016  . Intractable migraine [G43.919] 07/27/2014    Total Time spent with patient: 30 minutes  Musculoskeletal: Strength & Muscle Tone: within normal limits Gait & Station: normal Patient leans: N/A  Psychiatric Specialty Exam: ROS mild headaches, no visual disturbances, no chest pain, no shortness of breath, no nausea, no vomiting , no fever, no chills  Blood pressure 125/89, pulse 99, temperature 98.2 F (36.8 C), temperature source Oral, resp. rate 18, height 5\' 3"  (1.6 m), weight 89.3 kg (196 lb 14.4 oz), SpO2 100 %.Body mass index is 34.88 kg/m.  General Appearance: improved grooming   Eye Contact::  Good  Speech:  Normal Rate409  Volume:  Normal  Mood:  improved mood , states " I am feeling OK"   Affect:  Appropriate and Full Range  Thought Process:  Linear  Orientation:  Full (Time, Place, and Person)  Thought Content:  no hallucinations, no delusions, not internally preoccupied   Suicidal Thoughts:  No denies any suicidal or self injurious ideations, denies any homicidal or violent ideations   Homicidal Thoughts:  No  Memory:  recent and remote grossly intact   Judgement:  Other:  improved  Insight:  improving  Psychomotor Activity:  Normal  Concentration:  Good  Recall:  recent and remote grossly intact   Fund of Knowledge:Good  Language: Good  Akathisia:  Negative  Handed:  Right  AIMS (if indicated):   no abnormal or involuntary movements noted or reported   Assets:  Communication  Skills Desire for Improvement Resilience  Sleep:  Number of Hours: 5.75  Cognition: WNL  ADL's:  Intact   Mental Status Per Nursing Assessment::   On Admission:  Suicidal ideation indicated by patient  Demographic Factors:  51 year old female, separated, lives with daughter   Loss Factors: Separation, divorce, financial difficulties   Historical Factors: History of depression, history of anxiety, long history of psychotic symptoms. Reported worsening symptoms in the context of psychosocial stressors, divorce.   Risk Reduction Factors:   Sense of responsibility to family, Living with another person, especially a relative and Positive coping skills or problem solving skills  Continued Clinical Symptoms:  Patient is presenting with improvement compared to admission.  At this time she is alert, attentive, well related, pleasant, reports she is feeling significantly better than on admission, and presents with an improved mood and range of affect. She is not thought disordered and currently denies any hallucinations, does not appear internally preoccupied. No delusions expressed . No suicidal or homicidal ideations . Oriented x 3. Behavior on unit in good control  Denies medication side effects- but requests to stop Lunesta as not working well for her insomnia, and starting Trazodone ,which she feels worked better for her during a past trial, without side effects.  We have reviewed medication side effects, to include risk of movement disorders, metabolic disturbances, increased prolactin on antipsychotics .  Of note, Head MRI was ordered due to elevated Prolactin level. 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  Of  note, patient has no associated neurological symptoms and no amenorrhea or galactorrhea. Cognitive Features That Contribute To Risk:  No gross cognitive deficits noted upon discharge. Is alert , attentive, and oriented x 3   Suicide Risk:  Mild:  Suicidal ideation of limited frequency, intensity, duration, and specificity.  There are no identifiable plans, no associated intent, mild dysphoria and related symptoms, good self-control (both objective and subjective assessment), few other risk factors, and identifiable protective factors, including available and accessible social support.  [facility].   Specialty:  Behavioral Health Why:  2/9 at 12:45pm for medication management with Noemi Chapel. 2/5 at 1:00pm for therapy with Celesta Gentile. Contact information: [place] [address] [phone]           Plan Of Care/Follow-up recommendations:  Activity:  as tolerated  Diet:  Regular Tests:  NA Other:  See below Patient is requesting discharge She is leaving unit in good spirits Plans to return home Plans to follow up as above  Plans to follow up with her PCP, at [facility] We have reviewed MRI findings , and patient plans to follow up withy PCP and to seek Neurology referral for ongoing monitoring as needed  Neita Garnet, MD 01/21/2017, 12:38 PM

## 2017-01-21 NOTE — Progress Notes (Signed)
Recreation Therapy Notes  Date:01/21/17 [id number] Location: [place]  Group Topic: Stress Management  Goal Area(s) Addresses:  Patient will verbalize importance of using healthy stress management.  Patient will identify positive emotions associated with healthy stress management.   Intervention: Stress Management  Activity: Guided Imagery. LRT introduced the stress management techniques of guided imagery. LRT read a script for patients to engage in the technique. Patients were to follow along as LRT read the script.  Education:Stress Management, Discharge Planning.   Education Outcome:Acknowledges edcuation/In group clarification offered/Needs additional education  Clinical Observations/Feedback:Pt did not attend group.    [name], LRT/CTRS         [name] A 01/21/2017 12:54 PM

## 2017-01-21 NOTE — Progress Notes (Signed)
  [facility] Adult Case Management Discharge Plan :  Will you be returning to the same living situation after discharge:  Yes,  Pt returning home At discharge, do you have transportation home?: Yes,  Pt daughter to pick up Do you have the ability to pay for your medications: Yes,  Pt provided with prescriptions  Release of information consent forms completed and in the chart;  Patient's signature needed at discharge.  Patient to Follow up at: [facility].   Specialty:  Behavioral Health Why:  2/9 at 12:45pm for medication management with Noemi Chapel. 2/5 at 1:00pm for therapy with Celesta Gentile. Contact information: [address] [place] [address] [phone]           Next level of care provider has access to [place] and Suicide Prevention discussed: Yes,  with Pt; 2 unsuccessful attempts with daughter  Have you used any form of tobacco in the last 30 days? (Cigarettes, Smokeless Tobacco, Cigars, and/or Pipes): Yes  Has patient been referred to the Quitline?: Patient refused referral  Patient has been referred for addiction treatment: Yes  Gladstone Lighter 01/21/2017, 12:28 PM

## 2017-01-21 NOTE — Tx Team (Signed)
Interdisciplinary Treatment and Diagnostic Plan Update  01/21/2017 Time of Session: 12:26 PM  Deborah Pena MRN: [id number]  Principal Diagnosis: Schizoaffective disorder, depressive type [facility])  Secondary Diagnoses: Principal Problem:   Schizoaffective disorder, depressive type ([place]) Active Problems:   PTSD (post-traumatic stress disorder)   Panic disorder   Current Medications:  Current Facility-Administered Medications  Medication Dose Route Frequency Provider Last Rate Last Dose  . acetaminophen (TYLENOL) tablet 650 mg  650 mg Oral Q6H PRN Patrecia Pour, NP   650 mg at 01/21/17 0219  . alum & mag hydroxide-simeth (MAALOX/MYLANTA) 200-200-20 MG/5ML suspension 30 mL  30 mL Oral Q4H PRN Patrecia Pour, NP      . clonazePAM Bobbye [place]) tablet 0.5 mg  0.5 mg Oral BID PRN Jenne Campus, MD   0.5 mg at 01/21/17 0814  . divalproex (DEPAKOTE ER) 24 hr tablet 500 mg  500 mg Oral QHS Myer Peer Cobos, MD      . eszopiclone (LUNESTA) tablet 2 mg  2 mg Oral QHS PRN Jenne Campus, MD   2 mg at 01/20/17 2105  . lurasidone (LATUDA) tablet 20 mg  20 mg Oral QHS Fernando A Cobos, MD      . magnesium hydroxide (MILK OF MAGNESIA) suspension 30 mL  30 mL Oral Daily PRN Patrecia Pour, NP      . nicotine (NICODERM CQ - dosed in mg/24 hours) patch 21 mg  21 mg Transdermal Q0600 Jenne Campus, MD   21 mg at 01/21/17 [id number]  . sertraline (ZOLOFT) tablet 75 mg  75 mg Oral Daily Jenne Campus, MD   75 mg at 01/21/17 0810    PTA Medications: Prescriptions Prior to Admission  Medication Sig Dispense Refill Last Dose  . risperiDONE (RISPERDAL) 3 MG tablet Take 9 mg by mouth at bedtime.   01/13/2017 at Unknown time  . [DISCONTINUED] acetaminophen (TYLENOL) 325 MG tablet Take 650 mg by mouth every 6 (six) hours as needed.   Past Week at Unknown time    Treatment Modalities: Medication Management, Group therapy, Case management,  1 to 1 session with clinician, Psychoeducation, Recreational  therapy.  Patient Stressors: Financial difficulties Loss of  relationship  Patient Strengths: Average or above average intelligence Capable of independent living General fund of knowledge Motivation for treatment/growth  Physician Treatment Plan for Primary Diagnosis: Schizoaffective disorder, depressive type ([place]) Long Term Goal(s): Improvement in symptoms so as ready for discharge  Short Term Goals: Ability to verbalize feelings will improve Compliance with prescribed medications will improve Ability to verbalize feelings will improve Compliance with prescribed medications will improve  Medication Management: Evaluate patient's response, side effects, and tolerance of medication regimen.  Therapeutic Interventions: 1 to 1 sessions, Unit Group sessions and Medication administration.  Evaluation of Outcomes: Adequate for Discharge  Physician Treatment Plan for Secondary Diagnosis: Principal Problem:   Schizoaffective disorder, depressive type ([place]) Active Problems:   PTSD (post-traumatic stress disorder)   Panic disorder   Long Term Goal(s): Improvement in symptoms so as ready for discharge  Short Term Goals: Ability to verbalize feelings will improve Compliance with prescribed medications will improve Ability to verbalize feelings will improve Compliance with prescribed medications will improve  Medication Management: Evaluate patient's response, side effects, and tolerance of medication regimen.  Therapeutic Interventions: 1 to 1 sessions, Unit Group sessions and Medication administration.  Evaluation of Outcomes: Adequate for Discharge   RN Treatment Plan for Primary Diagnosis: Schizoaffective disorder, depressive type ([place]) Long Term Goal(s): Knowledge  of disease and therapeutic regimen to maintain health will improve  Short Term Goals: Ability to verbalize feelings will improve, Ability to disclose and discuss suicidal ideas and Ability to identify and develop  effective coping behaviors will improve  Medication Management: RN will administer medications as ordered by provider, will assess and evaluate patient's response and provide education to patient for prescribed medication. RN will report any adverse and/or side effects to prescribing provider.  Therapeutic Interventions: 1 on 1 counseling sessions, Psychoeducation, Medication administration, Evaluate responses to treatment, Monitor vital signs and CBGs as ordered, Perform/monitor CIWA, COWS, AIMS and Fall Risk screenings as ordered, Perform wound care treatments as ordered.  Evaluation of Outcomes: Adequate for Discharge   LCSW Treatment Plan for Primary Diagnosis: Schizoaffective disorder, depressive type ([place]) Long Term Goal(s): Safe transition to appropriate next level of care at discharge, Engage patient in therapeutic group addressing interpersonal concerns.  Short Term Goals: Engage patient in aftercare planning with referrals and resources, Identify triggers associated with mental health/substance abuse issues and Increase skills for wellness and recovery  Therapeutic Interventions: Assess for all discharge needs, 1 to 1 time with Social worker, Explore available resources and support systems, Assess for adequacy in community support network, Educate family and significant other(s) on suicide prevention, Complete Psychosocial Assessment, Interpersonal group therapy.  Evaluation of Outcomes: Adequate for Discharge   Progress in Treatment: Attending groups: Yes Participating in groups: Intermittently Taking medication as prescribed: Yes, MD continues to assess for medication changes as needed Toleration medication: Yes, no side effects reported at this time Family/Significant other contact made: No, CSW attempted to make contact with daughter Patient understands diagnosis: Continuing to assess Discussing patient identified problems/goals with staff: Yes Medical problems stabilized or  resolved: Yes Denies suicidal/homicidal ideation: Yes Issues/concerns per patient self-inventory: None Other: N/A  New problem(s) identified: None identified at this time.   New Short Term/Long Term Goal(s): None identified at this time.   Discharge Plan or Barriers: Pt will return home and follow-up with outpatient services.   Reason for Continuation of Hospitalization: None identified at this time.   Estimated Length of Stay: 0 days  Attendees: Patient: 01/21/2017  12:26 PM  Physician: Dr. Parke Poisson 01/21/2017  12:26 PM  Nursing: Cassandria Santee, RN 01/21/2017  12:26 PM  RN Care Manager: Lars Pinks, RN 01/21/2017  12:26 PM  Social Worker: Adriana Reams, [place]; [place], [place] 01/21/2017  12:26 PM  Recreational Therapist:  01/21/2017  12:26 PM  Other: Lindell Spar, NP; Samuel Jester, NP 01/21/2017  12:26 PM  Other:  01/21/2017  12:26 PM  Other: 01/21/2017  12:26 PM    Scribe for Treatment Team: Gladstone Lighter, LCSW 01/21/2017 12:26 PM

## 2017-01-23 NOTE — Discharge Summary (Signed)
Physician Discharge Summary Note  Patient:  Deborah Pena is an 51 y.o., female MRN:  [id number] DOB:  01-01-1966 Patient phone:  [phone] (home)  Patient address:   [place] [address],  Total Time spent with patient: Greater than 30 minutes  Date of Admission:  01/15/2017 Date of Discharge: 01-21-17  Reason for Admission: Worsening symptoms of Schizoaffective disorder.  Principal Problem: Schizoaffective disorder, depressive type [facility])  Discharge Diagnoses: Patient Active Problem List   Diagnosis Date Noted  . Schizoaffective disorder, depressive type ([place]) [F25.1] 01/16/2017  . PTSD (post-traumatic stress disorder) [F43.10] 01/16/2017  . Panic disorder [F41.0] 01/16/2017  . Acute diverticulitis [K57.92] 04/26/2016  . Diverticulitis of large intestine with abscess without bleeding [K57.20] 04/26/2016  . Intractable migraine [G43.919] 07/27/2014   Past Psychiatric History: Schizoaffective disorder, depressive - type  Past Medical History:  Past Medical History:  Diagnosis Date  . Anemia   . Anginal pain ([place])   . Anxiety   . Arthritis    "left hand" (04/26/2016)  . Asthma   . Chronic lower back pain   . Diverticulitis of colon   . GERD (gastroesophageal reflux disease)   . Headache    'once or twice/week" (04/26/2016)  . Migraine    "twice/month" (04/26/2016)  . Stomach ulcer     Past Surgical History:  Procedure Laterality Date  . ABDOMINAL HYSTERECTOMY  2013   preformed at [facility] by Dr Jacqualyn Posey in April  . BREAST SURGERY Bilateral ~ 2000   "dual lateral duct removal"   . BUNIONECTOMY Right 2013  . CESAREAN SECTION  1999  . OOPHORECTOMY Right 2013  . TONSILLECTOMY     Family History:  Family History  Problem Relation Age of Onset  . Heart failure Father   . Diabetes Other   . Breast cancer Other   . Diverticulitis Other   . Mental illness Paternal Grandmother   . Mental illness Paternal Grandfather   . Schizophrenia  Paternal Aunt   . Suicidality Maternal Aunt    Family Psychiatric  History: See H&P  Social History:  History  Alcohol Use No     History  Drug Use No    Social History   Social History  . Marital status: Married    Spouse name: N/A  . Number of children: N/A  . Years of education: N/A   Social History Main Topics  . Smoking status: Former Smoker    Packs/day: 2.00    Years: 30.00    Types: Cigarettes  . Smokeless tobacco: Former Systems developer    Types: Chew     Comment: "quit smoking cigarettes in ~ 2011; chewed when I was little"  . Alcohol use No  . Drug use: No  . Sexual activity: Not Currently   Other Topics Concern  . None   Social History Narrative  . None   Hospital Course: Deborah Pena a 51 y.o.caucasian female, who is separated , lives in [place] with Deborah Pena daughter, has a hx of anxiety, psychosis, PTSD , depression, who presented to [facility] with SI and worsening anxiety symptoms.  Upon Deborah Pena admision to the [facility] adult unit, Deborah Pena was evaluated & Deborah Pena presenting symptoms identified. Deborah Pena lab results showed a blood alcohol level of 48. However, Deborah Pena was not presenting with any substance withdrawal symptoms. Deborah Pena did not receive any detoxification treatments as result, but was started on;Trazodone 50 mg for insomnia, nicotine patch 21 mg for nicotine withdrawal symptoms, Klonopin 0.5 mg for severe anxiety,  Depakote ER 500 mg for mood stabilization, Hydroxyzine 25 mg prn for anxiety, Latuda 20 mg for mood control & Sertraline 100 mg for depression. Deborah Pena was enrolled & encouraged to participate in the unit programming, Deborah Pena did & learned coping skills. Deborah Pena presented no other significant health issues that required treatment or monitoring.   During the course of Deborah Pena hospitalization, Deborah Pena was evaluated on daily basis by the clinical providers to assure Deborah Pena response to Deborah Pena treatment regimen.As Deborah Pena treatment progressed, improvement was noted as evidenced by Deborah Pena report of decreasing symptoms,  improved mood, presentation of good affect, medication tolerance & active participation in the unit programming.Deborah Pena was encouraged to update Deborah Pena providers on Deborah Pena progress by daily completion of a self inventory assessment, noting mood, mental status, any new symptoms, anxiety & or concerns.  Deborah Pena's symptoms responded well to Deborah Pena treatment regimen combined with a therapeutic and supportive environment . Deborah Pena was motivated for recovery as evidenced by a Deborah Pena positive/appropriate behavior & Deborah Pena interaction with the staff & fellow patients.Deborah Pena also worked closely with the treatment team & case manager to develop a discharge plan with appropriate goals to maintain mood stability after discharge.   Upon Deborah Pena hospital discharge, Deborah Pena was in much improved condition than upon admission.Deborah Pena symptoms were reported as significantly decreased or resolved completely. Deborah Pena adamantly denies any SI/HI,  AVH, delusional thoughts & or paranoia. Deborah Pena was motivated to continue taking medication with a goal of continued improvement in mental health. Deborah Pena will continue psychiatric care on an outpatient basis as noted below. Deborah Pena is provided with all the necessary information required to make this appointment without problems.  Deborah Pena left [facility] with all personal belongings in no apparent distress. Transportation per daughter.  Physical Findings: AIMS: Facial and Oral Movements Muscles of Facial Expression: None, normal Lips and Perioral Area: None, normal Jaw: Minimal Tongue: Minimal,Extremity Movements Upper (arms, wrists, hands, fingers): None, normal Lower (legs, knees, ankles, toes): None, normal, Trunk Movements Neck, shoulders, hips: None, normal, Overall Severity Severity of abnormal movements (highest score from questions above): Minimal Incapacitation due to abnormal movements: None, normal Patient's awareness of abnormal movements (rate only patient's report): Aware, moderate distress, Dental Status Current problems  with teeth and/or dentures?: No Does patient usually wear dentures?: No  CIWA:    COWS:     Musculoskeletal: Strength & Muscle Tone: within normal limits Gait & Station: normal Patient leans: N/A  Psychiatric Specialty Exam: Physical Exam  Constitutional: Deborah Pena appears well-developed.  HENT:  Head: Normocephalic.  Eyes: Pupils are equal, round, and reactive to light.  Neck: Normal range of motion.  Cardiovascular: Normal rate.   Respiratory: Effort normal.  GI: Soft.  Genitourinary:  Genitourinary Comments: Denies any issues in this area  Musculoskeletal: Normal range of motion.  Neurological: Deborah Pena is alert.  Skin: Skin is warm.    Review of Systems  Constitutional: Negative.   HENT: Negative.   Eyes: Negative.   Respiratory: Negative.   Cardiovascular: Negative.   Gastrointestinal: Negative.   Genitourinary: Negative.   Musculoskeletal: Negative.   Skin: Negative.   Neurological: Negative.   Endo/Heme/Allergies: Negative.   Psychiatric/Behavioral: Positive for depression (Stable) and substance abuse (Hx. Alcohol abuse). Negative for hallucinations, memory loss and suicidal ideas. The patient has insomnia (Stable). The patient is not nervous/anxious.     Blood pressure 125/89, pulse 99, temperature 98.2 F (36.8 C), temperature source Oral, resp. rate 18, height 5\' 3"  (1.6 m), weight 89.3 kg (196 lb 14.4 oz), SpO2 100 %.Body mass index  is 34.88 kg/m.  See Md's SRA  Have you used any form of tobacco in the last 30 days? (Cigarettes, Smokeless Tobacco, Cigars, and/or Pipes): Yes  Has this patient used any form of tobacco in the last 30 days? (Cigarettes, Smokeless Tobacco, Cigars, and/or Pipes): Yes, provided with a nicotine patch prescriptine  Blood Alcohol level:  Lab Results  Component Value Date   ETH 48 (H) 01/14/2017   ETH <11 [id number]   Metabolic Disorder Labs:  Lab Results  Component Value Date   HGBA1C 4.8 01/17/2017   MPG 91 01/17/2017   Lab Results   Component Value Date   PROLACTIN 192.0 (H) 01/17/2017   Lab Results  Component Value Date   CHOL 176 01/17/2017   TRIG 137 01/17/2017   HDL 61 01/17/2017   CHOLHDL 2.9 01/17/2017   VLDL 27 01/17/2017   [place] 88 01/17/2017   See Psychiatric Specialty Exam and Suicide Risk Assessment completed by Attending Physician prior to discharge.  Discharge destination:  Home  Is patient on multiple antipsychotic therapies at discharge:  No   Has Patient had three or more failed trials of antipsychotic monotherapy by history:  No  Recommended Plan for Multiple Antipsychotic Therapies: NA  Allergies as of 01/21/2017      Reactions   Buprenorphine Hcl Other (See Comments)   "Becomes psycotic." --per notes from another healthcare network.    Ciprofloxacin Itching   Sore throat, hoarseness   Codeine Itching   Itches from inside out. PEr pt Deborah Pena can tolerate now.   Morphine And Related Other (See Comments)   Becomes phsycotic.Per pt Deborah Pena is able to tolerate now.   Propranolol    Syncope.       Medication List    STOP taking these medications   risperiDONE 3 MG tablet Commonly known as:  RISPERDAL     TAKE these medications     Indication  acetaminophen 325 MG tablet Commonly known as:  TYLENOL Take 2 tablets (650 mg total) by mouth every 6 (six) hours as needed. For fever/pain What changed:  additional instructions  Indication:  Fever, Pain   clonazePAM 0.5 MG tablet Commonly known as:  KLONOPIN Take 1 tablet (0.5 mg total) by mouth 2 (two) times daily as needed (severe anxiety).  Indication:  Anxiety   divalproex 500 MG 24 hr tablet Commonly known as:  DEPAKOTE ER Take 1 tablet (500 mg total) by mouth at bedtime. For mood stabilization  Indication:  Mood stabilization   hydrOXYzine 25 MG tablet Commonly known as:  ATARAX/VISTARIL Take 1 tablet (25 mg total) by mouth 3 (three) times daily as needed for anxiety.  Indication:  Anxiety Neurosis   lurasidone 20 MG Tabs  tablet Commonly known as:  LATUDA Take 1 tablet (20 mg total) by mouth at bedtime. For mood control  Indication:  Mood control   nicotine 21 mg/24hr patch Commonly known as:  NICODERM CQ - dosed in mg/24 hours Place 1 patch (21 mg total) onto the skin daily at 6 (six) AM. For smoking cessation  Indication:  Nicotine Addiction   sertraline 100 MG tablet Commonly known as:  ZOLOFT Take 1 tablet (100 mg total) by mouth daily. For depression  Indication:  Major Depressive Disorder   traZODone 50 MG tablet Commonly known as:  DESYREL Take 1 tablet (50 mg total) by mouth at bedtime as needed for sleep.  Indication:  Trouble Sleeping      Follow-up Information    [facility] Follow up.   Specialty:  Behavioral Health Why:  2/9 at 12:45pm for medication management with Noemi Chapel. 2/5 at 1:00pm for therapy with Celesta Gentile. Contact information: [address] [place] [address] [phone]          Follow-up recommendations: Activity:  As tolerated Diet: As recommended by your primary care doctor. Keep all scheduled follow-up appointments as recommended.   Comments: Patient is instructed prior to discharge to: Take all medications as prescribed by his/Deborah Pena mental healthcare provider. Report any adverse effects and or reactions from the medicines to his/Deborah Pena outpatient provider promptly. Patient has been instructed & cautioned: To not engage in alcohol and or illegal drug use while on prescription medicines. In the event of worsening symptoms, patient is instructed to call the crisis hotline, 911 and or go to the nearest ED for appropriate evaluation and treatment of symptoms. To follow-up with his/Deborah Pena primary care provider for your other medical issues, concerns and or health care needs.   Signed: Encarnacion Slates, NP, PMHNP, FNP-BC 01/23/2017, 3:23 PM   Patient seen, Suicide Assessment Completed.  Disposition Plan Reviewed

## 2017-02-03 ENCOUNTER — Emergency Department (HOSPITAL_COMMUNITY): Payer: Managed Care, Other (non HMO)

## 2017-02-03 ENCOUNTER — Encounter (HOSPITAL_COMMUNITY): Payer: Self-pay | Admitting: Emergency Medicine

## 2017-02-03 ENCOUNTER — Emergency Department (HOSPITAL_COMMUNITY)
Admission: EM | Admit: 2017-02-03 | Discharge: 2017-02-04 | Disposition: A | Discharge status: Home / Self Care | Payer: Managed Care, Other (non HMO) | Attending: Emergency Medicine | Admitting: Emergency Medicine

## 2017-02-03 ENCOUNTER — Other Ambulatory Visit: Payer: Self-pay

## 2017-02-03 DIAGNOSIS — R42 Dizziness and giddiness: Secondary | ICD-10-CM | POA: Diagnosis not present

## 2017-02-03 DIAGNOSIS — Z87891 Personal history of nicotine dependence: Secondary | ICD-10-CM | POA: Insufficient documentation

## 2017-02-03 DIAGNOSIS — R0789 Other chest pain: Secondary | ICD-10-CM | POA: Insufficient documentation

## 2017-02-03 DIAGNOSIS — R079 Chest pain, unspecified: Secondary | ICD-10-CM

## 2017-02-03 DIAGNOSIS — J45909 Unspecified asthma, uncomplicated: Secondary | ICD-10-CM | POA: Insufficient documentation

## 2017-02-03 DIAGNOSIS — R531 Weakness: Secondary | ICD-10-CM | POA: Insufficient documentation

## 2017-02-03 DIAGNOSIS — Z79899 Other long term (current) drug therapy: Secondary | ICD-10-CM | POA: Insufficient documentation

## 2017-02-03 LAB — URINALYSIS, ROUTINE W REFLEX MICROSCOPIC
Bilirubin Urine: NEGATIVE
Glucose, UA: NEGATIVE mg/dL
Ketones, ur: NEGATIVE mg/dL
Leukocytes, UA: NEGATIVE
NITRITE: NEGATIVE
PH: 6 (ref 5.0–8.0)
Protein, ur: NEGATIVE mg/dL
SPECIFIC GRAVITY, URINE: 1.005 (ref 1.005–1.030)

## 2017-02-03 LAB — RAPID URINE DRUG SCREEN, HOSP PERFORMED
AMPHETAMINES: POSITIVE — AB
BARBITURATES: NOT DETECTED
Benzodiazepines: POSITIVE — AB
COCAINE: NOT DETECTED
OPIATES: NOT DETECTED
TETRAHYDROCANNABINOL: NOT DETECTED

## 2017-02-03 LAB — I-STAT TROPONIN, ED
TROPONIN I, POC: 0 ng/mL (ref 0.00–0.08)
Troponin i, poc: 0 ng/mL (ref 0.00–0.08)

## 2017-02-03 LAB — BASIC METABOLIC PANEL
Anion gap: 8 (ref 5–15)
BUN: 7 mg/dL (ref 6–20)
CHLORIDE: 106 mmol/L (ref 101–111)
CO2: 25 mmol/L (ref 22–32)
Calcium: 9.5 mg/dL (ref 8.9–10.3)
Creatinine, Ser: 0.95 mg/dL (ref 0.44–1.00)
Glucose, Bld: 99 mg/dL (ref 65–99)
POTASSIUM: 3.9 mmol/L (ref 3.5–5.1)
SODIUM: 139 mmol/L (ref 135–145)

## 2017-02-03 LAB — CBC
HEMATOCRIT: 39.9 % (ref 36.0–46.0)
Hemoglobin: 13.6 g/dL (ref 12.0–15.0)
MCH: 30.2 pg (ref 26.0–34.0)
MCHC: 34.1 g/dL (ref 30.0–36.0)
MCV: 88.7 fL (ref 78.0–100.0)
PLATELETS: 291 10*3/uL (ref 150–400)
RBC: 4.5 MIL/uL (ref 3.87–5.11)
RDW: 11.9 % (ref 11.5–15.5)
WBC: 5.9 10*3/uL (ref 4.0–10.5)

## 2017-02-03 LAB — POC URINE PREG, ED: Preg Test, Ur: NEGATIVE

## 2017-02-03 LAB — ACETAMINOPHEN LEVEL: Acetaminophen (Tylenol), Serum: <10 ug/mL — ABNORMAL LOW (ref 10–30)

## 2017-02-03 LAB — ETHANOL: Alcohol, Ethyl (B): <5 mg/dL (ref <5)

## 2017-02-03 LAB — SALICYLATE LEVEL: Salicylate Lvl: <7 mg/dL (ref 2.8–30.0)

## 2017-02-03 MED ORDER — SODIUM CHLORIDE 0.9 % IV BOLUS (SEPSIS)
1000.0000 mL | Freq: Once | INTRAVENOUS | Status: AC
  Administered 2017-02-03: 1000 mL via INTRAVENOUS

## 2017-02-03 NOTE — ED Provider Notes (Signed)
[facility] DEPT Provider Note   CSN: [id number] Arrival date & time: 02/03/17 1633     History    Chief Complaint  Patient presents with  . Chest Pain  . Dizziness     HPI Deborah Pena is a 51 y.o. female.  51yo F w/ PMH including schizoaffective disorder, PTSD, GERD who p/w Multiple complaints including chest pain, weakness, dizziness. Patient reports a 2 week history of constant, central chest pressure associated with lightheadedness and shortness of breath. Nothing makes the pain better or worse. She states that she has been progressively weaker and has both dizziness described as room spinning sensation as well as lightheadedness. Associated symptoms including "it hurts to talk," blurry vision, nausea, and difficulty functioning at home. She states that she took herself off of multiple medications after she was discharged from psychiatric hospitalization on 2/2, including stopping Zoloft, Depakote, Klonopin, and Latuda. She currently takes Focalin, Xanax, and trazodone. She states that her depressive symptoms have been under decent control and she denies any SI, HI, or hallucinations.  No recent travel, leg swelling, history of blood clots, history of cancer, or OCP use.   Past Medical History:  Diagnosis Date  . Anemia   . Anginal pain ([place])   . Anxiety   . Arthritis    "left hand" (04/26/2016)  . Asthma   . Chronic lower back pain   . Diverticulitis of colon   . GERD (gastroesophageal reflux disease)   . Headache    'once or twice/week" (04/26/2016)  . Migraine    "twice/month" (04/26/2016)  . Stomach ulcer      Patient Active Problem List   Diagnosis Date Noted  . Schizoaffective disorder, depressive type ([place]) 01/16/2017  . PTSD (post-traumatic stress disorder) 01/16/2017  . Panic disorder 01/16/2017  . Acute diverticulitis 04/26/2016  . Diverticulitis of large intestine with abscess without bleeding 04/26/2016  . Intractable migraine 07/27/2014    Past  Surgical History:  Procedure Laterality Date  . ABDOMINAL HYSTERECTOMY  2013   preformed at [facility] by Dr Jacqualyn Posey in April  . BREAST SURGERY Bilateral ~ 2000   "dual lateral duct removal"   . BUNIONECTOMY Right 2013  . CESAREAN SECTION  1999  . OOPHORECTOMY Right 2013  . TONSILLECTOMY      OB History    No data available        Home Medications    Prior to Admission medications   Medication Sig Start Date End Date Taking? Authorizing Provider  acetaminophen (TYLENOL) 325 MG tablet Take 2 tablets (650 mg total) by mouth every 6 (six) hours as needed. For fever/pain 01/21/17  Yes Encarnacion Slates, NP  ALPRAZolam Duanne Moron) 1 MG tablet Take 1 mg by mouth 3 (three) times daily.   Yes Historical Provider, MD  Dexmethylphenidate HCl (FOCALIN XR) 40 MG CP24 Take 40 mg by mouth daily.   Yes Historical Provider, MD  traZODone (DESYREL) 50 MG tablet Take 1 tablet (50 mg total) by mouth at bedtime as needed for sleep. 01/21/17  Yes Encarnacion Slates, NP  clonazePAM (KLONOPIN) 0.5 MG tablet Take 1 tablet (0.5 mg total) by mouth 2 (two) times daily as needed (severe anxiety). Patient not taking: Reported on 02/03/2017 01/21/17   Encarnacion Slates, NP  divalproex (DEPAKOTE ER) 500 MG 24 hr tablet Take 1 tablet (500 mg total) by mouth at bedtime. For mood stabilization Patient not taking: Reported on 02/03/2017 01/21/17   Encarnacion Slates, NP  hydrOXYzine (ATARAX/VISTARIL) 25 MG tablet  Take 1 tablet (25 mg total) by mouth 3 (three) times daily as needed for anxiety. Patient not taking: Reported on 02/03/2017 01/21/17   Encarnacion Slates, NP  lurasidone (LATUDA) 20 MG TABS tablet Take 1 tablet (20 mg total) by mouth at bedtime. For mood control Patient not taking: Reported on 02/03/2017 01/21/17   Encarnacion Slates, NP  nicotine (NICODERM CQ - DOSED IN MG/24 HOURS) 21 mg/24hr patch Place 1 patch (21 mg total) onto the skin daily at 6 (six) AM. For smoking cessation Patient not taking: Reported on 02/03/2017 01/22/17   Encarnacion Slates, NP    sertraline (ZOLOFT) 100 MG tablet Take 1 tablet (100 mg total) by mouth daily. For depression Patient not taking: Reported on 02/03/2017 01/22/17   Encarnacion Slates, NP      Family History  Problem Relation Age of Onset  . Heart failure Father   . Diabetes Other   . Breast cancer Other   . Diverticulitis Other   . Mental illness Paternal Grandmother   . Mental illness Paternal Grandfather   . Schizophrenia Paternal Aunt   . Suicidality Maternal Aunt      Social History  Substance Use Topics  . Smoking status: Former Smoker    Packs/day: 2.00    Years: 30.00    Types: Cigarettes  . Smokeless tobacco: Former Systems developer    Types: Chew     Comment: "quit smoking cigarettes in ~ 2011; chewed when I was Damarri Rampy"  . Alcohol use No     Allergies     Buprenorphine hcl; Ciprofloxacin; Codeine; Morphine and related; and Propranolol    Review of Systems  10 Systems reviewed and are negative for acute change except as noted in the HPI.   Physical Exam Updated Vital Signs BP 114/77   Pulse 81   Temp 98.1 F (36.7 C) (Oral)   Resp 14   Ht 5\' 2"  (1.575 m)   Wt 197 lb 6.4 oz (89.5 kg)   SpO2 100%   BMI 36.10 kg/m   Physical Exam  Constitutional: She is oriented to person, place, and time. She appears well-developed and well-nourished. No distress.  Awake, alert  HENT:  Head: Normocephalic and atraumatic.  Eyes: Conjunctivae and EOM are normal. Pupils are equal, round, and reactive to light.  Neck: Neck supple.  Cardiovascular: Normal rate, regular rhythm and normal heart sounds.   No murmur heard. Pulmonary/Chest: Effort normal and breath sounds normal. No respiratory distress.  Abdominal: Soft. Bowel sounds are normal. She exhibits no distension. There is no tenderness.  Musculoskeletal: She exhibits no edema.  Neurological: She is alert and oriented to person, place, and time. She has normal reflexes. No cranial nerve deficit. She exhibits normal muscle tone.  Slowed but  Fluent speech, normal finger-to-nose testing, negative pronator drift, no clonus 5/5 strength and normal sensation x all 4 extremities  Skin: Skin is warm and dry.  Psychiatric:  Bizarre, flat affect  Nursing note and vitals reviewed.     ED Treatments / Results  Labs (all labs ordered are listed, but only abnormal results are displayed) Labs Reviewed  URINALYSIS, ROUTINE W REFLEX MICROSCOPIC - Abnormal; Notable for the following:       Result Value   APPearance CLOUDY (*)    Hgb urine dipstick SMALL (*)    Bacteria, UA MANY (*)    Squamous Epithelial / LPF 0-5 (*)    All other components within normal limits  RAPID URINE DRUG SCREEN, HOSP  PERFORMED - Abnormal; Notable for the following:    Benzodiazepines POSITIVE (*)    Amphetamines POSITIVE (*)    All other components within normal limits  ACETAMINOPHEN LEVEL - Abnormal; Notable for the following:    Acetaminophen (Tylenol), Serum <10 (*)    All other components within normal limits  BASIC METABOLIC PANEL  CBC  ETHANOL  SALICYLATE LEVEL  I-STAT TROPOININ, ED  I-STAT TROPOININ, ED  POC URINE PREG, ED  POC URINE PREG, ED     EKG  EKG Interpretation  Date/Time:  Thursday February 03 2017 16:54:58 EST Ventricular Rate:  115 PR Interval:    QRS Duration: 77 QT Interval:  360 QTC Calculation: 498 R Axis:   17 Text Interpretation:  Sinus tachycardia Anterior infarct, age indeterminate No significant change since last tracing Confirmed by Stiles Maxcy MD, Ashtian Villacis [phone]) on 02/03/2017 11:35:42 PM         Radiology Dg Chest 2 View  Result Date: 02/03/2017 CLINICAL DATA:  Shortness of breath and chest pain EXAM: CHEST  2 VIEW COMPARISON:  Chest radiograph 04/19/2016 FINDINGS: The lungs are well inflated. Cardiomediastinal contours are normal. No pneumothorax or pleural effusion. No focal airspace consolidation or pulmonary edema. IMPRESSION: Clear lungs. Electronically Signed   By: Ulyses Jarred M.D.   On: 02/03/2017 17:36     Procedures Procedures (including critical care time) Procedures  Medications Ordered in ED  Medications  sodium chloride 0.9 % bolus 1,000 mL (1,000 mLs Intravenous New Bag/Given 02/03/17 2257)     Initial Impression / Assessment and Plan / ED Course  I have reviewed the triage vital signs and the nursing notes.  Pertinent labs & imaging results that were available during my care of the patient were reviewed by me and considered in my medical decision making (see chart for details).    PT p/w multiple ongoing complaints including 2 weeks of constant chest pain associated with dizziness, lightheadedness, shortness of breath, generalized weakness. She was awake and alert, comfortable on exam with normal vital signs. Although she endorsed progressively worsening weakness, she had normal strength and normal neurologic exam. She stated that she had been unable to walk due to weakness at home but was able to ambulate without assistance to the bathroom. Obtained above lab work including serial troponins. All labwork was unremarkable. No evidence of dehydration or infection. Chest x-ray negative acute. Patient is PERC negative therefore PE very unlikely. I discussed her mental health at length as I am concerned that she also discontinued multiple medications that the patient states that she tapered off of these medications and has kept her psychiatrist up-to-date. She denies any SI or HI today and I do not feel she requires any acute psychiatric evaluation at this time. It is possible, however, that her current complaints may be related to her mental health and I have encouraged her to contact her psychiatrist for follow-up appointment. I reviewed her chart which does show a recent MRI brain noting a pituitary microadenoma. I do not feel that this is causing neurologic symptoms from mass effect but it is possible that some of her symptoms are related to endocrine process. She has been given an  endocrinologist follow up with and I encouraged her to schedule a next available appointment. Also instructed to follow-up with PCP regarding the symptoms. Patient voiced understanding and was discharged in satisfactory condition.  Final Clinical Impressions(s) / ED Diagnoses   Final diagnoses:  Chest pain, unspecified type  Dizziness  Weakness  New Prescriptions   No medications on file       Sharlett Iles, MD 02/04/17 [phone]

## 2017-02-03 NOTE — ED Triage Notes (Signed)
Pt reports CP and dizziness for the past 2 weeks that is worse with movement. Also endorses SOB. Pt reports it hurts to talk

## 2017-02-03 NOTE — ED Notes (Signed)
Pt ambulatory to restroom

## 2017-02-03 NOTE — ED Notes (Signed)
Patient transported to X-ray 

## 2017-02-04 NOTE — ED Notes (Signed)
Pt left paperwork in room.  Stated she did not want it.  Ambulatory to lobby.

## 2017-02-07 ENCOUNTER — Encounter (HOSPITAL_COMMUNITY): Payer: Self-pay | Admitting: Emergency Medicine

## 2017-02-07 ENCOUNTER — Emergency Department (HOSPITAL_COMMUNITY): Payer: Managed Care, Other (non HMO)

## 2017-02-07 DIAGNOSIS — R531 Weakness: Secondary | ICD-10-CM | POA: Insufficient documentation

## 2017-02-07 DIAGNOSIS — Z87891 Personal history of nicotine dependence: Secondary | ICD-10-CM | POA: Insufficient documentation

## 2017-02-07 DIAGNOSIS — J45909 Unspecified asthma, uncomplicated: Secondary | ICD-10-CM | POA: Diagnosis not present

## 2017-02-07 NOTE — ED Triage Notes (Signed)
Pt presents from home with [facility] for chills, subjective fever, productive cough, n/v, left sided abd pain x 1 week; also reporting decreased PO intake

## 2017-02-08 ENCOUNTER — Emergency Department (HOSPITAL_COMMUNITY)
Admission: EM | Admit: 2017-02-08 | Discharge: 2017-02-08 | Disposition: A | Discharge status: Home / Self Care | Payer: Managed Care, Other (non HMO) | Attending: Emergency Medicine | Admitting: Emergency Medicine

## 2017-02-08 DIAGNOSIS — R531 Weakness: Secondary | ICD-10-CM

## 2017-02-08 LAB — COMPREHENSIVE METABOLIC PANEL
ALBUMIN: 4 g/dL (ref 3.5–5.0)
ALK PHOS: 34 U/L — AB (ref 38–126)
ALT: 24 U/L (ref 14–54)
ANION GAP: 9 (ref 5–15)
AST: 18 U/L (ref 15–41)
BILIRUBIN TOTAL: 0.9 mg/dL (ref 0.3–1.2)
BUN: 5 mg/dL — AB (ref 6–20)
CALCIUM: 9.3 mg/dL (ref 8.9–10.3)
CO2: 26 mmol/L (ref 22–32)
CREATININE: 1.05 mg/dL — AB (ref 0.44–1.00)
Chloride: 104 mmol/L (ref 101–111)
GFR calc Af Amer: >60 mL/min (ref >60)
GFR calc non Af Amer: >60 mL/min (ref >60)
GLUCOSE: 99 mg/dL (ref 65–99)
Potassium: 4 mmol/L (ref 3.5–5.1)
Sodium: 139 mmol/L (ref 135–145)
Total Protein: 6.2 g/dL — ABNORMAL LOW (ref 6.5–8.1)

## 2017-02-08 LAB — CBC WITH DIFFERENTIAL/PLATELET
BASOS PCT: 0 %
Basophils Absolute: 0 10*3/uL (ref 0.0–0.1)
Eosinophils Absolute: 0.2 10*3/uL (ref 0.0–0.7)
Eosinophils Relative: 3 %
HEMATOCRIT: 39.6 % (ref 36.0–46.0)
HEMOGLOBIN: 13.6 g/dL (ref 12.0–15.0)
Lymphocytes Relative: 43 %
Lymphs Abs: 2.4 10*3/uL (ref 0.7–4.0)
MCH: 31 pg (ref 26.0–34.0)
MCHC: 34.3 g/dL (ref 30.0–36.0)
MCV: 90.2 fL (ref 78.0–100.0)
MONOS PCT: 10 %
Monocytes Absolute: 0.6 10*3/uL (ref 0.1–1.0)
NEUTROS ABS: 2.4 10*3/uL (ref 1.7–7.7)
NEUTROS PCT: 44 %
Platelets: 231 10*3/uL (ref 150–400)
RBC: 4.39 MIL/uL (ref 3.87–5.11)
RDW: 12 % (ref 11.5–15.5)
WBC: 5.5 10*3/uL (ref 4.0–10.5)

## 2017-02-08 LAB — MAGNESIUM: Magnesium: 1.8 mg/dL (ref 1.7–2.4)

## 2017-02-08 LAB — ETHANOL: Alcohol, Ethyl (B): <5 mg/dL (ref <5)

## 2017-02-08 NOTE — ED Notes (Signed)
Offered patient warm blanket.  Patient sleeping in wheelchair.  Patient, when awoken, states "how much longer is it going to be?".  Apologized for delays.  Patient agitated and states "I would have never come here if I knew it was going to take this long".  Patient walked to elevators and left while RN was handing out other warm blankets.

## 2017-02-08 NOTE — ED Notes (Signed)
Patient now in corner in lobby charging phone and sitting on the floor.

## 2017-02-08 NOTE — ED Notes (Signed)
This RN rounded on patient and asked pt if she could give a urine sample. Pt stated that she was hooked up to too many wires, in which I replied that we could unhook and I would help her to the restroom 2 doors down. Patient then stated, "I'd rather not. I was here before and we did all of that stuff and it was fine. I'm here for many different things that they need to figure out instead because it's ridiculous." Explained to patient that if she changes her mind to let us know.

## 2017-02-08 NOTE — ED Provider Notes (Signed)
[facility] DEPT Provider Note   CSN: [id number] Arrival date & time: 02/07/17  2134   By signing my name below, I, Evelene Croon, attest that this documentation has been prepared under the direction and in the presence of Deborah Balls, MD . Electronically Signed: Evelene Croon, Scribe. 02/08/2017. 1:59 AM.   History   Chief Complaint Chief Complaint  Patient presents with  . Influenza   The history is provided by the patient. No language interpreter was used.     HPI Comments:  Deborah Pena is a 51 y.o. female who presents to the Emergency Department via EMS complaining of generalized weakness and fatigue x 2 weeks. She reports associated lightheadedness, chills, decreased appetite and LLQ abdominal pain; notes h/o diverticulitis. No alleviating factors noted.  She has an appointment scheduled with her PCP for today (02/08/2017). Denies recent changes to daily meds.   Past Medical History:  Diagnosis Date  . Anemia   . Anginal pain ([place])   . Anxiety   . Arthritis    "left hand" (04/26/2016)  . Asthma   . Chronic lower back pain   . Diverticulitis of colon   . GERD (gastroesophageal reflux disease)   . Headache    'once or twice/week" (04/26/2016)  . Migraine    "twice/month" (04/26/2016)  . Stomach ulcer     Patient Active Problem List   Diagnosis Date Noted  . Schizoaffective disorder, depressive type ([place]) 01/16/2017  . PTSD (post-traumatic stress disorder) 01/16/2017  . Panic disorder 01/16/2017  . Acute diverticulitis 04/26/2016  . Diverticulitis of large intestine with abscess without bleeding 04/26/2016  . Intractable migraine 07/27/2014    Past Surgical History:  Procedure Laterality Date  . ABDOMINAL HYSTERECTOMY  2013   preformed at [facility] by Dr Jacqualyn Posey in April  . BREAST SURGERY Bilateral ~ 2000   "dual lateral duct removal"   . BUNIONECTOMY Right 2013  . CESAREAN SECTION  1999  . OOPHORECTOMY Right 2013  . TONSILLECTOMY      OB History    No  data available       Home Medications    Prior to Admission medications   Medication Sig Start Date End Date Taking? Authorizing Provider  acetaminophen (TYLENOL) 325 MG tablet Take 2 tablets (650 mg total) by mouth every 6 (six) hours as needed. For fever/pain 01/21/17   Encarnacion Slates, NP  ALPRAZolam Duanne Moron) 1 MG tablet Take 1 mg by mouth 3 (three) times daily.    Historical Provider, MD  clonazePAM (KLONOPIN) 0.5 MG tablet Take 1 tablet (0.5 mg total) by mouth 2 (two) times daily as needed (severe anxiety). Patient not taking: Reported on 02/03/2017 01/21/17   Encarnacion Slates, NP  Dexmethylphenidate HCl (FOCALIN XR) 40 MG CP24 Take 40 mg by mouth daily.    Historical Provider, MD  divalproex (DEPAKOTE ER) 500 MG 24 hr tablet Take 1 tablet (500 mg total) by mouth at bedtime. For mood stabilization Patient not taking: Reported on 02/03/2017 01/21/17   Encarnacion Slates, NP  hydrOXYzine (ATARAX/VISTARIL) 25 MG tablet Take 1 tablet (25 mg total) by mouth 3 (three) times daily as needed for anxiety. Patient not taking: Reported on 02/03/2017 01/21/17   Encarnacion Slates, NP  lurasidone (LATUDA) 20 MG TABS tablet Take 1 tablet (20 mg total) by mouth at bedtime. For mood control Patient not taking: Reported on 02/03/2017 01/21/17   Encarnacion Slates, NP  nicotine (NICODERM CQ - DOSED IN MG/24 HOURS) 21 mg/24hr patch Place  1 patch (21 mg total) onto the skin daily at 6 (six) AM. For smoking cessation Patient not taking: Reported on 02/03/2017 01/22/17   Encarnacion Slates, NP  sertraline (ZOLOFT) 100 MG tablet Take 1 tablet (100 mg total) by mouth daily. For depression Patient not taking: Reported on 02/03/2017 01/22/17   Encarnacion Slates, NP  traZODone (DESYREL) 50 MG tablet Take 1 tablet (50 mg total) by mouth at bedtime as needed for sleep. 01/21/17   Encarnacion Slates, NP    Family History Family History  Problem Relation Age of Onset  . Heart failure Father   . Diabetes Other   . Breast cancer Other   . Diverticulitis Other   .  Mental illness Paternal Grandmother   . Mental illness Paternal Grandfather   . Schizophrenia Paternal Aunt   . Suicidality Maternal Aunt     Social History Social History  Substance Use Topics  . Smoking status: Former Smoker    Packs/day: 2.00    Years: 30.00    Types: Cigarettes  . Smokeless tobacco: Former Systems developer    Types: Chew     Comment: "quit smoking cigarettes in ~ 2011; chewed when I was little"  . Alcohol use No     Allergies   Buprenorphine hcl; Ciprofloxacin; Codeine; Morphine and related; and Propranolol   Review of Systems Review of Systems  10 systems reviewed and all are negative for acute change except as noted in the HPI.   Physical Exam Updated Vital Signs BP 105/73   Pulse 87   Temp 98.8 F (37.1 C) (Oral)   Resp 13   SpO2 99%   Physical Exam  Constitutional: She is oriented to person, place, and time. She appears well-developed and well-nourished. No distress.  HENT:  Head: Normocephalic and atraumatic.  Nose: Nose normal.  Mouth/Throat: Oropharynx is clear and moist. No oropharyngeal exudate.  Eyes: Conjunctivae and EOM are normal. Pupils are equal, round, and reactive to light. No scleral icterus.  Neck: Normal range of motion. Neck supple. No JVD present. No tracheal deviation present. No thyromegaly present.  Cardiovascular: Normal rate, regular rhythm and normal heart sounds.  Exam reveals no gallop and no friction rub.   No murmur heard. Pulmonary/Chest: Effort normal and breath sounds normal. No respiratory distress. She has no wheezes. She exhibits no tenderness.  Abdominal: Soft. Bowel sounds are normal. She exhibits no distension and no mass. There is no tenderness. There is no rebound and no guarding.  Musculoskeletal: Normal range of motion. She exhibits no edema or tenderness.  Lymphadenopathy:    She has no cervical adenopathy.  Neurological: She is alert and oriented to person, place, and time. No cranial nerve deficit. She  exhibits normal muscle tone.  Skin: Skin is warm and dry. No rash noted. No erythema. No pallor.  Psychiatric: Her mood appears anxious.  Nursing note and vitals reviewed.    ED Treatments / Results  DIAGNOSTIC STUDIES:  Oxygen Saturation is 99% on RA, normal by my interpretation.    COORDINATION OF CARE:  1:55 AM Discussed treatment plan with pt at bedside and pt agreed to plan.  Labs (all labs ordered are listed, but only abnormal results are displayed) Labs Reviewed  COMPREHENSIVE METABOLIC PANEL - Abnormal; Notable for the following:       Result Value   BUN 5 (*)    Creatinine, Ser 1.05 (*)    Total Protein 6.2 (*)    Alkaline Phosphatase 34 (*)  All other components within normal limits  CBC WITH DIFFERENTIAL/PLATELET  MAGNESIUM  ETHANOL    EKG  EKG Interpretation  Date/Time:  Tuesday February 08 2017 02:25:56 EST Ventricular Rate:  88 PR Interval:    QRS Duration: 106 QT Interval:  367 QTC Calculation: 444 R Axis:   24 Text Interpretation:  Sinus rhythm Low voltage, precordial leads rate slower since last tracing Confirmed by Glynn Octave [phone]) on 02/08/2017 2:35:27 AM       Radiology Dg Chest 2 View  Result Date: 02/07/2017 CLINICAL DATA:  51 y/o F; productive cough, chest pain, and dizziness. EXAM: CHEST  2 VIEW COMPARISON:  02/03/2017 chest radiograph. FINDINGS: Stable heart size and mediastinal contours are within normal limits. Both lungs are clear. Mild degenerative changes of the thoracic spine. IMPRESSION: No active cardiopulmonary disease. Electronically Signed   By: Kristine Garbe M.D.   On: 02/07/2017 22:22    Procedures Procedures (including critical care time)  Medications Ordered in ED Medications - No data to display   Initial Impression / Assessment and Plan / ED Course  I have reviewed the triage vital signs and the nursing notes.  Pertinent labs & imaging results that were available during my care of the  patient were reviewed by me and considered in my medical decision making (see chart for details).     Patient presents to the ED for multiple complaints.  Her physical exam and VS are all normal. She was seen at [facility] for this recently as well.  Will repeat labs for any acute changes.  CXR is neg.  Patient may be developing depression as she states she has no interest doing things and stays at home sleeping.  She denies SI and was advised to follow up with PCP regarding this.   3:24 AM LAbs are all normal.  She continues to appear well with normal VS.  PCP fu strongly encouraged as she has had 2 neg ED visit. She demonstrates good understanding. She is in NAD.  Patient safe for DC.  Final Clinical Impressions(s) / ED Diagnoses   Final diagnoses:  None    New Prescriptions New Prescriptions   No medications on file   I personally performed the services described in this documentation, which was scribed in my presence. The recorded information has been reviewed and is accurate.       Deborah Balls, MD 02/08/17 [phone]

## 2017-02-26 ENCOUNTER — Emergency Department (HOSPITAL_COMMUNITY)
Admission: EM | Admit: 2017-02-26 | Discharge: 2017-02-27 | Disposition: A | Discharge status: Home / Self Care | Payer: 59 | Attending: Emergency Medicine | Admitting: Emergency Medicine

## 2017-02-26 ENCOUNTER — Encounter (HOSPITAL_COMMUNITY): Payer: Self-pay | Admitting: Emergency Medicine

## 2017-02-26 DIAGNOSIS — J45909 Unspecified asthma, uncomplicated: Secondary | ICD-10-CM | POA: Insufficient documentation

## 2017-02-26 DIAGNOSIS — Z79899 Other long term (current) drug therapy: Secondary | ICD-10-CM | POA: Insufficient documentation

## 2017-02-26 DIAGNOSIS — Z87891 Personal history of nicotine dependence: Secondary | ICD-10-CM | POA: Diagnosis not present

## 2017-02-26 DIAGNOSIS — F251 Schizoaffective disorder, depressive type: Secondary | ICD-10-CM | POA: Diagnosis not present

## 2017-02-26 DIAGNOSIS — T50904A Poisoning by unspecified drugs, medicaments and biological substances, undetermined, initial encounter: Secondary | ICD-10-CM

## 2017-02-26 DIAGNOSIS — F4310 Post-traumatic stress disorder, unspecified: Secondary | ICD-10-CM | POA: Diagnosis present

## 2017-02-26 DIAGNOSIS — T424X4A Poisoning by benzodiazepines, undetermined, initial encounter: Secondary | ICD-10-CM | POA: Insufficient documentation

## 2017-02-26 LAB — COMPREHENSIVE METABOLIC PANEL
ALBUMIN: 3.6 g/dL (ref 3.5–5.0)
ALT: 47 U/L (ref 14–54)
ANION GAP: 4 — AB (ref 5–15)
AST: 43 U/L — ABNORMAL HIGH (ref 15–41)
Alkaline Phosphatase: 42 U/L (ref 38–126)
BUN: 17 mg/dL (ref 6–20)
CHLORIDE: 108 mmol/L (ref 101–111)
CO2: 26 mmol/L (ref 22–32)
Calcium: 8.7 mg/dL — ABNORMAL LOW (ref 8.9–10.3)
Creatinine, Ser: 0.83 mg/dL (ref 0.44–1.00)
GFR calc Af Amer: >60 mL/min (ref >60)
GFR calc non Af Amer: >60 mL/min (ref >60)
GLUCOSE: 95 mg/dL (ref 65–99)
POTASSIUM: 4 mmol/L (ref 3.5–5.1)
SODIUM: 138 mmol/L (ref 135–145)
Total Bilirubin: 0.4 mg/dL (ref 0.3–1.2)
Total Protein: 6.4 g/dL — ABNORMAL LOW (ref 6.5–8.1)

## 2017-02-26 LAB — CBG MONITORING, ED: Glucose-Capillary: 93 mg/dL (ref 65–99)

## 2017-02-26 LAB — CBC
HEMATOCRIT: 35 % — AB (ref 36.0–46.0)
HEMOGLOBIN: 11.8 g/dL — AB (ref 12.0–15.0)
MCH: 30.6 pg (ref 26.0–34.0)
MCHC: 33.7 g/dL (ref 30.0–36.0)
MCV: 90.9 fL (ref 78.0–100.0)
Platelets: 197 10*3/uL (ref 150–400)
RBC: 3.85 MIL/uL — ABNORMAL LOW (ref 3.87–5.11)
RDW: 12.4 % (ref 11.5–15.5)
WBC: 8.1 10*3/uL (ref 4.0–10.5)

## 2017-02-26 NOTE — ED Notes (Signed)
Attempted to get urine but it was contaminated
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
## 2017-02-26 NOTE — ED Notes (Signed)
Bed: WA09 Expected date:  Expected time:  Means of arrival:  Comments: EMS "taking too much medication"

## 2017-02-26 NOTE — ED Provider Notes (Signed)
[facility] DEPT Provider Note   CSN: [id number] Arrival date & time: 02/26/17  2215  By signing my name below, I, Deborah Pena, attest that this documentation has been prepared under the direction and in the presence of Deborah Greek, MD. Electronically Signed: Margit Pena, ED Scribe. 02/26/17. 11:59 AM.    History   Chief Complaint Chief Complaint  Patient presents with  . Drug Overdose  . IVC    HPI Deborah Pena is a 51 y.o. female who presents to the Emergency Department due to potential drug overdose earlier today. Pt states she took 2 Xanax throughout the day; however, pt's daughter reports she took ~41 tables because her Rx bottle is nearly empty after being filled yesterday. Per nursing note, pts daughter also believes pt took the rest of her 25 mg Hydroxyzine and 150 mg Trazodone, as these bottles are also nearly empty. Pt denies taking this quantity of medication when asked on interview and states "I took two, my daughter hid the bottle". Pt reports feeling weak and tired. Pt denies abusing her medication and any SI.  The history is provided by the patient, the EMS personnel and medical records. No language interpreter was used.    Past Medical History:  Diagnosis Date  . Anemia   . Anginal pain ([place])   . Anxiety   . Arthritis    "left hand" (04/26/2016)  . Asthma   . Chronic lower back pain   . Diverticulitis of colon   . GERD (gastroesophageal reflux disease)   . Headache    'once or twice/week" (04/26/2016)  . Migraine    "twice/month" (04/26/2016)  . Stomach ulcer     Patient Active Problem List   Diagnosis Date Noted  . Schizoaffective disorder, depressive type ([place]) 01/16/2017  . PTSD (post-traumatic stress disorder) 01/16/2017  . Panic disorder 01/16/2017  . Acute diverticulitis 04/26/2016  . Diverticulitis of large intestine with abscess without bleeding 04/26/2016  . Intractable migraine 07/27/2014    Past Surgical History:    Procedure Laterality Date  . ABDOMINAL HYSTERECTOMY  2013   preformed at [facility] by Dr Jacqualyn Posey in April  . BREAST SURGERY Bilateral ~ 2000   "dual lateral duct removal"   . BUNIONECTOMY Right 2013  . CESAREAN SECTION  1999  . OOPHORECTOMY Right 2013  . TONSILLECTOMY      OB History    No data available       Home Medications    Prior to Admission medications   Medication Sig Start Date End Date Taking? Authorizing Provider  acetaminophen (TYLENOL) 325 MG tablet Take 2 tablets (650 mg total) by mouth every 6 (six) hours as needed. For fever/pain 01/21/17  Yes Encarnacion Slates, NP  ALPRAZolam Duanne Moron) 1 MG tablet Take 1 mg by mouth 3 (three) times daily.   Yes Historical Provider, MD  traZODone (DESYREL) 50 MG tablet Take 1 tablet (50 mg total) by mouth at bedtime as needed for sleep. 01/21/17  Yes Encarnacion Slates, NP  clonazePAM (KLONOPIN) 0.5 MG tablet Take 1 tablet (0.5 mg total) by mouth 2 (two) times daily as needed (severe anxiety). Patient not taking: Reported on 02/03/2017 01/21/17   Encarnacion Slates, NP  divalproex (DEPAKOTE ER) 500 MG 24 hr tablet Take 1 tablet (500 mg total) by mouth at bedtime. For mood stabilization Patient not taking: Reported on 02/03/2017 01/21/17   Encarnacion Slates, NP  hydrOXYzine (ATARAX/VISTARIL) 25 MG tablet Take 1 tablet (25 mg total) by mouth 3 (  three) times daily as needed for anxiety. Patient not taking: Reported on 02/03/2017 01/21/17   Encarnacion Slates, NP  lurasidone (LATUDA) 20 MG TABS tablet Take 1 tablet (20 mg total) by mouth at bedtime. For mood control Patient not taking: Reported on 02/03/2017 01/21/17   Encarnacion Slates, NP  nicotine (NICODERM CQ - DOSED IN MG/24 HOURS) 21 mg/24hr patch Place 1 patch (21 mg total) onto the skin daily at 6 (six) AM. For smoking cessation Patient not taking: Reported on 02/03/2017 01/22/17   Encarnacion Slates, NP  sertraline (ZOLOFT) 100 MG tablet Take 1 tablet (100 mg total) by mouth daily. For depression Patient not taking: Reported  on 02/03/2017 01/22/17   Encarnacion Slates, NP    Family History Family History  Problem Relation Age of Onset  . Heart failure Father   . Diabetes Other   . Breast cancer Other   . Diverticulitis Other   . Mental illness Paternal Grandmother   . Mental illness Paternal Grandfather   . Schizophrenia Paternal Aunt   . Suicidality Maternal Aunt     Social History Social History  Substance Use Topics  . Smoking status: Former Smoker    Packs/day: 2.00    Years: 30.00    Types: Cigarettes  . Smokeless tobacco: Former Systems developer    Types: Chew     Comment: "quit smoking cigarettes in ~ 2011; chewed when I was little"  . Alcohol use No     Allergies   Buprenorphine hcl; Ciprofloxacin; Codeine; Morphine and related; and Propranolol   Review of Systems Review of Systems  Neurological: Positive for weakness.  Psychiatric/Behavioral: Negative for suicidal ideas.  All other systems reviewed and are negative.   Physical Exam Updated Vital Signs BP 130/56   Pulse 75   Temp 98.3 F (36.8 C) (Oral)   Resp 19   Ht 5\' 2"  (1.575 m)   Wt 195 lb (88.5 kg)   SpO2 100%   BMI 35.67 kg/m   Physical Exam  Constitutional: She is oriented to person, place, and time. She appears well-developed and well-nourished. No distress.  HENT:  Head: Normocephalic and atraumatic.  Right Ear: Hearing normal.  Left Ear: Hearing normal.  Nose: Nose normal.  Mouth/Throat: Oropharynx is clear and moist and mucous membranes are normal.  Eyes: Conjunctivae and EOM are normal. Pupils are equal, round, and reactive to light.  Neck: Normal range of motion. Neck supple.  Cardiovascular: Regular rhythm, S1 normal and S2 normal.  Exam reveals no gallop and no friction rub.   No murmur heard. Pulmonary/Chest: Effort normal and breath sounds normal. No respiratory distress. She exhibits no tenderness.  Abdominal: Soft. Normal appearance and bowel sounds are normal. There is no hepatosplenomegaly. There is no  tenderness. There is no rebound, no guarding, no tenderness at McBurney's point and negative Murphy's sign. No hernia.  Musculoskeletal: Normal range of motion.  Neurological: She is alert and oriented to person, place, and time. She has normal strength. No cranial nerve deficit or sensory deficit. Coordination normal. GCS eye subscore is 4. GCS verbal subscore is 5. GCS motor subscore is 6.  Skin: Skin is warm, dry and intact. No rash noted. No cyanosis.  Psychiatric: Thought content normal. Her speech is slurred. She is slowed and withdrawn. She exhibits a depressed mood. She expresses no suicidal ideation.  Slow to respond.   Nursing note and vitals reviewed.    ED Treatments / Results  DIAGNOSTIC STUDIES: Oxygen Saturation is 94%  on RA, adequate by my interpretation.   COORDINATION OF CARE: 11:18 PM-Discussed next steps with pt which includes labs. Pt verbalized understanding and is agreeable with the plan.    Labs (all labs ordered are listed, but only abnormal results are displayed) Labs Reviewed  COMPREHENSIVE METABOLIC PANEL - Abnormal; Notable for the following:       Result Value   Calcium 8.7 (*)    Total Protein 6.4 (*)    AST 43 (*)    Anion gap 4 (*)    All other components within normal limits  ACETAMINOPHEN LEVEL - Abnormal; Notable for the following:    Acetaminophen (Tylenol), Serum <10 (*)    All other components within normal limits  CBC - Abnormal; Notable for the following:    RBC 3.85 (*)    Hemoglobin 11.8 (*)    HCT 35.0 (*)    All other components within normal limits  RAPID URINE DRUG SCREEN, HOSP PERFORMED - Abnormal; Notable for the following:    Benzodiazepines POSITIVE (*)    All other components within normal limits  URINALYSIS, ROUTINE W REFLEX MICROSCOPIC - Abnormal; Notable for the following:    Color, Urine STRAW (*)    All other components within normal limits  ETHANOL  SALICYLATE LEVEL  CBG MONITORING, ED    EKG  EKG  Interpretation  Date/Time:  Saturday February 26 2017 23:01:31 EST Ventricular Rate:  75 PR Interval:    QRS Duration: 96 QT Interval:  385 QTC Calculation: 430 R Axis:   51 Text Interpretation:  Sinus rhythm Normal ECG Confirmed by POLLINA  MD, CHRISTOPHER ([id number]) on 02/27/2017 12:37:49 AM       Radiology No results found.  Procedures Procedures (including critical care time)  Medications Ordered in ED Medications - No data to display   Initial Impression / Assessment and Plan / ED Course  I have reviewed the triage vital signs and the nursing notes.  Pertinent labs & imaging results that were available during my care of the patient were reviewed by me and considered in my medical decision making (see chart for details).     Patient presents to the emergency department after family became concerned about possible overdose. Patient reportedly had 90 Xanax tablets filled yesterday and has only a few tablets left. She also appears to have less of the Vistaril and trazodone that she should based on her prescription. Patient is obviously showing signs of intoxication at arrival. She denies, however, any possibility of overdose. Family has initiated involuntary commitment paperwork. Please control recommended. Of observation which has been performed. At this point she is medically clear for psychiatric evaluation.  Final Clinical Impressions(s) / ED Diagnoses   Final diagnoses:  Drug overdose, undetermined intent, initial encounter    New Prescriptions New Prescriptions   No medications on file   I personally performed the services described in this documentation, which was scribed in my presence. The recorded information has been reviewed and is accurate.     Deborah Greek, MD 02/27/17 [phone]

## 2017-02-26 NOTE — ED Triage Notes (Signed)
Brought in by [facility] from home with c/o drug overdose.  Pt's daughter called [facility] and reported that "Mom took a lot of medications and I wanted her to be checked out".  Pt's daughter reported that pt has been "dizzy and almost fell tonight".  It was further reported that pt's Alprazolam 1 mg was filled to 90 tablets yesterday but now, there are only 7 tablets left.  Pt is also on Hydroxyzine 25 mg and Trazodone 150 mg--- both Rx bottles are now almost empty which, per daughter, "is too soon to get filled".  Pt ambulatory to room, alert and awake.

## 2017-02-27 LAB — URINALYSIS, ROUTINE W REFLEX MICROSCOPIC
BILIRUBIN URINE: NEGATIVE
GLUCOSE, UA: NEGATIVE mg/dL
HGB URINE DIPSTICK: NEGATIVE
KETONES UR: NEGATIVE mg/dL
LEUKOCYTES UA: NEGATIVE
Nitrite: NEGATIVE
PH: 6 (ref 5.0–8.0)
Protein, ur: NEGATIVE mg/dL
Specific Gravity, Urine: 1.008 (ref 1.005–1.030)

## 2017-02-27 LAB — RAPID URINE DRUG SCREEN, HOSP PERFORMED
AMPHETAMINES: NOT DETECTED
BENZODIAZEPINES: POSITIVE — AB
Barbiturates: NOT DETECTED
Cocaine: NOT DETECTED
OPIATES: NOT DETECTED
TETRAHYDROCANNABINOL: NOT DETECTED

## 2017-02-27 LAB — ETHANOL: Alcohol, Ethyl (B): <5 mg/dL (ref <5)

## 2017-02-27 LAB — ACETAMINOPHEN LEVEL: Acetaminophen (Tylenol), Serum: <10 ug/mL — ABNORMAL LOW (ref 10–30)

## 2017-02-27 LAB — SALICYLATE LEVEL: Salicylate Lvl: <7 mg/dL (ref 2.8–30.0)

## 2017-02-27 MED ORDER — ONDANSETRON HCL 4 MG PO TABS: 4.0000 mg | ORAL_TABLET | Freq: Three times a day (TID) | ORAL | Status: DC | PRN

## 2017-02-27 MED ORDER — IBUPROFEN 200 MG PO TABS
600.0000 mg | ORAL_TABLET | Freq: Three times a day (TID) | ORAL | Status: DC | PRN
  Administered 2017-02-27: 600 mg via ORAL
  Filled 2017-02-27: qty 3

## 2017-02-27 MED ORDER — ALUM & MAG HYDROXIDE-SIMETH 200-200-20 MG/5ML PO SUSP: 30.0000 mL | ORAL | Status: DC | PRN

## 2017-02-27 MED ORDER — ACETAMINOPHEN 325 MG PO TABS
650.0000 mg | ORAL_TABLET | ORAL | Status: DC | PRN
  Administered 2017-02-27: 650 mg via ORAL
  Filled 2017-02-27: qty 2

## 2017-02-27 MED ORDER — NICOTINE 21 MG/24HR TD PT24: 21.0000 mg | MEDICATED_PATCH | Freq: Every day | TRANSDERMAL | Status: DC

## 2017-02-27 NOTE — ED Notes (Signed)
Per TTS:  Pt meets in-patient criteria.

## 2017-02-27 NOTE — Progress Notes (Signed)
Per Lindon Romp, NP meets inpatient criteria [facility] K. Nash Shearer, LPC-A, [facility]  Counselor 02/27/2017 5:09 AM

## 2017-02-27 NOTE — Progress Notes (Signed)
Contacted ED RN order cart for TTS Bertine Schlottman K. Nash Shearer, LPC-A, [facility]  Counselor 02/27/2017 4:34 AM
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
## 2017-02-27 NOTE — ED Notes (Signed)
TTS consult in process at bedside at this time via telepsych.

## 2017-02-27 NOTE — ED Notes (Signed)
TTS PRESENT 

## 2017-02-27 NOTE — ED Notes (Signed)
Bus pass . Pt declined. Pt states her husband will pick her up. IVC papers removed and pt discharged home

## 2017-02-27 NOTE — BHH Suicide Risk Assessment (Addendum)
Suicide Risk Assessment  Discharge Assessment   [facility] Discharge Suicide Risk Assessment   Principal Problem: Schizoaffective disorder, depressive type [facility]) Discharge Diagnoses:  Patient Active Problem List   Diagnosis Date Noted  . Schizoaffective disorder, depressive type ([place]) [F25.1] 01/16/2017    Priority: High  . PTSD (post-traumatic stress disorder) [F43.10] 01/16/2017    Priority: High  . Panic disorder [F41.0] 01/16/2017  . Acute diverticulitis [K57.92] 04/26/2016  . Diverticulitis of large intestine with abscess without bleeding [K57.20] 04/26/2016  . Intractable migraine [G43.919] 07/27/2014    Total Time spent with patient: 45 minutes  Musculoskeletal: Strength & Muscle Tone: within normal limits Gait & Station: normal Patient leans: Right  Psychiatric Specialty Exam:   Blood pressure (!) 112/49, pulse 78, temperature 98.3 F (36.8 C), temperature source Oral, resp. rate 16, height 5\' 2"  (1.575 m), weight 88.5 kg (195 lb), SpO2 98 %.Body mass index is 35.67 kg/m.  General Appearance: Casual  Eye Contact::  Good  Speech:  Normal Rate409  Volume:  Normal  Mood:  Euthymic  Affect:  Congruent  Thought Process:  Coherent and Descriptions of Associations: Intact  Orientation:  Full (Time, Place, and Person)  Thought Content:  WDL and Logical  Suicidal Thoughts:  No  Homicidal Thoughts:  No  Memory:  Immediate;   Good Recent;   Good Remote;   Good  Judgement:  Fair  Insight:  Fair  Psychomotor Activity:  Normal  Concentration:  Good  Recall:  Good  Fund of Knowledge:Fair  Language: Good  Akathisia:  No  Handed:  Right  AIMS (if indicated):     Assets:  Leisure Time Physical Health Resilience Social Support  Sleep:     Cognition: WNL  ADL's:  Intact   Mental Status Per Nursing Assessment::   On Admission:   IVC'd by her daughter who she has guardianship of due to her psychiatric issues.  Evidently the daughter wanted to party with her mother's alcohol  and IVC'd her.  Demographic Factors:  NA  Loss Factors: NA  Historical Factors: NA  Risk Reduction Factors:   Sense of responsibility to family, Living with another person, especially a relative, Positive social support and Positive therapeutic relationship  Continued Clinical Symptoms:  None  Cognitive Features That Contribute To Risk:  None    Suicide Risk:  Minimal: No identifiable suicidal ideation.  Patients presenting with no risk factors but with morbid ruminations; may be classified as minimal risk based on the severity of the depressive symptoms    Plan Of Care/Follow-up recommendations:  Activity:  as tolerated Diet:  heart healthy diet  Tylon Kemmerling, NP 02/27/2017, 11:02 AM

## 2017-02-27 NOTE — ED Notes (Signed)
IVC PAPERS PRESENT

## 2017-03-04 ENCOUNTER — Emergency Department (HOSPITAL_COMMUNITY): Payer: Managed Care, Other (non HMO)

## 2017-03-04 ENCOUNTER — Emergency Department (HOSPITAL_COMMUNITY)
Admission: EM | Admit: 2017-03-04 | Discharge: 2017-03-04 | Disposition: A | Discharge status: Home / Self Care | Payer: Managed Care, Other (non HMO) | Source: Home / Self Care

## 2017-03-04 ENCOUNTER — Emergency Department (HOSPITAL_COMMUNITY)
Admission: EM | Admit: 2017-03-04 | Discharge: 2017-03-04 | Disposition: A | Discharge status: Home / Self Care | Payer: Managed Care, Other (non HMO) | Attending: Emergency Medicine | Admitting: Emergency Medicine

## 2017-03-04 ENCOUNTER — Encounter (HOSPITAL_COMMUNITY): Payer: Self-pay | Admitting: Emergency Medicine

## 2017-03-04 ENCOUNTER — Encounter (HOSPITAL_COMMUNITY): Payer: Self-pay

## 2017-03-04 DIAGNOSIS — Z79899 Other long term (current) drug therapy: Secondary | ICD-10-CM | POA: Diagnosis not present

## 2017-03-04 DIAGNOSIS — J45909 Unspecified asthma, uncomplicated: Secondary | ICD-10-CM | POA: Diagnosis not present

## 2017-03-04 DIAGNOSIS — Z5321 Procedure and treatment not carried out due to patient leaving prior to being seen by health care provider: Secondary | ICD-10-CM

## 2017-03-04 DIAGNOSIS — Y939 Activity, unspecified: Secondary | ICD-10-CM | POA: Insufficient documentation

## 2017-03-04 DIAGNOSIS — W1830XA Fall on same level, unspecified, initial encounter: Secondary | ICD-10-CM

## 2017-03-04 DIAGNOSIS — Z87891 Personal history of nicotine dependence: Secondary | ICD-10-CM | POA: Diagnosis not present

## 2017-03-04 DIAGNOSIS — R4182 Altered mental status, unspecified: Secondary | ICD-10-CM | POA: Diagnosis present

## 2017-03-04 DIAGNOSIS — R1110 Vomiting, unspecified: Secondary | ICD-10-CM | POA: Insufficient documentation

## 2017-03-04 DIAGNOSIS — Y999 Unspecified external cause status: Secondary | ICD-10-CM | POA: Insufficient documentation

## 2017-03-04 DIAGNOSIS — W19XXXA Unspecified fall, initial encounter: Secondary | ICD-10-CM | POA: Insufficient documentation

## 2017-03-04 DIAGNOSIS — Y929 Unspecified place or not applicable: Secondary | ICD-10-CM

## 2017-03-04 DIAGNOSIS — R51 Headache: Secondary | ICD-10-CM

## 2017-03-04 LAB — CBC WITH DIFFERENTIAL/PLATELET
BASOS ABS: 0 10*3/uL (ref 0.0–0.1)
BASOS PCT: 0 %
EOS ABS: 0.2 10*3/uL (ref 0.0–0.7)
Eosinophils Relative: 1 %
HCT: 43.7 % (ref 36.0–46.0)
HEMOGLOBIN: 15.1 g/dL — AB (ref 12.0–15.0)
Lymphocytes Relative: 49 %
Lymphs Abs: 5.9 10*3/uL — ABNORMAL HIGH (ref 0.7–4.0)
MCH: 31.5 pg (ref 26.0–34.0)
MCHC: 34.6 g/dL (ref 30.0–36.0)
MCV: 91.2 fL (ref 78.0–100.0)
Monocytes Absolute: 0.8 10*3/uL (ref 0.1–1.0)
Monocytes Relative: 7 %
NEUTROS PCT: 43 %
Neutro Abs: 5.1 10*3/uL (ref 1.7–7.7)
PLATELETS: 444 10*3/uL — AB (ref 150–400)
RBC: 4.79 MIL/uL (ref 3.87–5.11)
RDW: 12.8 % (ref 11.5–15.5)
WBC: 12 10*3/uL — AB (ref 4.0–10.5)

## 2017-03-04 LAB — CBG MONITORING, ED: GLUCOSE-CAPILLARY: 100 mg/dL — AB (ref 65–99)

## 2017-03-04 LAB — I-STAT CHEM 8, ED
BUN: 15 mg/dL (ref 6–20)
CALCIUM ION: 1.09 mmol/L — AB (ref 1.15–1.40)
CREATININE: 1 mg/dL (ref 0.44–1.00)
Chloride: 105 mmol/L (ref 101–111)
GLUCOSE: 88 mg/dL (ref 65–99)
HEMATOCRIT: 43 % (ref 36.0–46.0)
Hemoglobin: 14.6 g/dL (ref 12.0–15.0)
Potassium: 3.7 mmol/L (ref 3.5–5.1)
SODIUM: 141 mmol/L (ref 135–145)
TCO2: 28 mmol/L (ref 0–100)

## 2017-03-04 MED ORDER — IBUPROFEN 400 MG PO TABS
600.0000 mg | ORAL_TABLET | Freq: Once | ORAL | Status: AC
  Administered 2017-03-04: 23:00:00 600 mg via ORAL
  Filled 2017-03-04: qty 1

## 2017-03-04 NOTE — ED Triage Notes (Signed)
Pt POV, brought in by family member for possible LOC and "seizure like activity." Pt not responding to ED staff, eyes open but fluttering. No body convulsions noted. Pt brought to trauma C, responded to ammonia inhalant.  VSS.

## 2017-03-04 NOTE — ED Provider Notes (Signed)
[facility] DEPT Provider Note   CSN: [id number] Arrival date & time: 03/04/17  2110     History   Chief Complaint Chief Complaint  Patient presents with  . Altered Mental Status    HPI Deborah Pena is a 51 y.o. female PMH PTSD, panic disorder, schizoaffective disorder presents for AMS. Mother reports patient fell one week (3/9) and has been experiencing HA since fall. Mother brought her to ED today for CT head to r/o brain tumor, as patient was told she had one in the past. Pt became unresponsive in the lobby and fluttering her eyelids. Pt told mother that she has been experiencing some memory loss and thinks she may be having seizures. Mother reports increased   Pt was recently at [facility] (3/10) for intentional drug overdose and IVC'd by family for SI. Mother states that the pt's daughter removed all medication from the house. No potential overdose today.   The history is provided by a relative and medical records.  Altered Mental Status   This is a new problem. The current episode started less than 1 hour ago. The problem has not changed since onset.Associated symptoms include unresponsiveness. Pertinent negatives include no seizures and no weakness. Her past medical history is significant for depression and head trauma. Her past medical history does not include diabetes, seizures, liver disease, CVA, TIA, hypertension, COPD, dementia or heart disease.    Past Medical History:  Diagnosis Date  . Anemia   . Anginal pain ([place])   . Anxiety   . Arthritis    "left hand" (04/26/2016)  . Asthma   . Chronic lower back pain   . Diverticulitis of colon   . GERD (gastroesophageal reflux disease)   . Headache    'once or twice/week" (04/26/2016)  . Migraine    "twice/month" (04/26/2016)  . Stomach ulcer     Patient Active Problem List   Diagnosis Date Noted  . Schizoaffective disorder, depressive type ([place]) 01/16/2017  . PTSD (post-traumatic stress disorder) 01/16/2017  .  Panic disorder 01/16/2017  . Acute diverticulitis 04/26/2016  . Diverticulitis of large intestine with abscess without bleeding 04/26/2016  . Intractable migraine 07/27/2014    Past Surgical History:  Procedure Laterality Date  . ABDOMINAL HYSTERECTOMY  2013   preformed at [facility] by Dr Jacqualyn Posey in April  . BREAST SURGERY Bilateral ~ 2000   "dual lateral duct removal"   . BUNIONECTOMY Right 2013  . CESAREAN SECTION  1999  . OOPHORECTOMY Right 2013  . TONSILLECTOMY      OB History    No data available       Home Medications    Prior to Admission medications   Medication Sig Start Date End Date Taking? Authorizing Provider  acetaminophen (TYLENOL) 325 MG tablet Take 2 tablets (650 mg total) by mouth every 6 (six) hours as needed. For fever/pain Patient taking differently: Take 650 mg by mouth every 6 (six) hours as needed (for headaches or pain). For fever/pain 01/21/17  Yes Encarnacion Slates, NP  ALPRAZolam Duanne Moron) 1 MG tablet Take 1 mg by mouth 3 (three) times daily as needed for anxiety.    Yes Historical Provider, MD  aspirin EC 81 MG tablet Take 81 mg by mouth daily.   Yes Historical Provider, MD  ibuprofen (ADVIL) 200 MG tablet Take 400 mg by mouth every 6 (six) hours as needed (for headaches).   Yes Historical Provider, MD  clonazePAM (KLONOPIN) 0.5 MG tablet Take 1 tablet (0.5 mg total) by  mouth 2 (two) times daily as needed (severe anxiety). Patient not taking: Reported on 03/04/2017 01/21/17   Encarnacion Slates, NP  divalproex (DEPAKOTE ER) 500 MG 24 hr tablet Take 1 tablet (500 mg total) by mouth at bedtime. For mood stabilization Patient not taking: Reported on 03/04/2017 01/21/17   Encarnacion Slates, NP  hydrOXYzine (ATARAX/VISTARIL) 25 MG tablet Take 1 tablet (25 mg total) by mouth 3 (three) times daily as needed for anxiety. Patient not taking: Reported on 03/04/2017 01/21/17   Encarnacion Slates, NP  lurasidone (LATUDA) 20 MG TABS tablet Take 1 tablet (20 mg total) by mouth at bedtime. For  mood control Patient not taking: Reported on 03/04/2017 01/21/17   Encarnacion Slates, NP  nicotine (NICODERM CQ - DOSED IN MG/24 HOURS) 21 mg/24hr patch Place 1 patch (21 mg total) onto the skin daily at 6 (six) AM. For smoking cessation Patient not taking: Reported on 03/04/2017 01/22/17   Encarnacion Slates, NP  sertraline (ZOLOFT) 100 MG tablet Take 1 tablet (100 mg total) by mouth daily. For depression Patient not taking: Reported on 03/04/2017 01/22/17   Encarnacion Slates, NP  traZODone (DESYREL) 50 MG tablet Take 1 tablet (50 mg total) by mouth at bedtime as needed for sleep. Patient not taking: Reported on 03/04/2017 01/21/17   Encarnacion Slates, NP    Family History Family History  Problem Relation Age of Onset  . Heart failure Father   . Diabetes Other   . Breast cancer Other   . Diverticulitis Other   . Mental illness Paternal Grandmother   . Mental illness Paternal Grandfather   . Schizophrenia Paternal Aunt   . Suicidality Maternal Aunt     Social History Social History  Substance Use Topics  . Smoking status: Former Smoker    Packs/day: 2.00    Years: 30.00    Types: Cigarettes  . Smokeless tobacco: Former Systems developer    Types: Chew     Comment: "quit smoking cigarettes in ~ 2011; chewed when I was little"  . Alcohol use No     Allergies   Buprenorphine hcl; Ciprofloxacin; Codeine; and Propranolol   Review of Systems Review of Systems  Unable to perform ROS: Patient unresponsive  Constitutional: Negative for fever.  Gastrointestinal: Negative for abdominal pain, diarrhea, nausea and vomiting.  Neurological: Positive for syncope and headaches. Negative for seizures, facial asymmetry, speech difficulty, weakness and light-headedness.  All other systems reviewed and are negative.    Physical Exam Updated Vital Signs BP (!) 141/61   Pulse (!) 101   Temp 98.6 F (37 C)   Resp (!) 22   SpO2 100%   Physical Exam  Constitutional: She appears well-developed and well-nourished. No  distress.  HENT:  Head: Normocephalic and atraumatic.  Eyes: Conjunctivae are normal. Pupils are equal, round, and reactive to light.  Neck: Neck supple.  Cardiovascular: Regular rhythm.  Tachycardia present.   No murmur heard. Pulmonary/Chest: Effort normal and breath sounds normal. No respiratory distress.  Abdominal: Soft. She exhibits no distension.  Musculoskeletal: She exhibits no edema.  Neurological: GCS eye subscore is 4. GCS verbal subscore is 1. GCS motor subscore is 6.  Skin: Skin is warm and dry. Capillary refill takes less than 2 seconds.  Nursing note and vitals reviewed.    ED Treatments / Results  Labs (all labs ordered are listed, but only abnormal results are displayed) Labs Reviewed  CBC WITH DIFFERENTIAL/PLATELET - Abnormal; Notable for the following:  Result Value   WBC 12.0 (*)    Hemoglobin 15.1 (*)    Platelets 444 (*)    Lymphs Abs 5.9 (*)    All other components within normal limits  CBG MONITORING, ED - Abnormal; Notable for the following:    Glucose-Capillary 100 (*)    All other components within normal limits  I-STAT CHEM 8, ED - Abnormal; Notable for the following:    Calcium, Ion 1.09 (*)    All other components within normal limits    EKG  EKG Interpretation None       Radiology Ct Head Wo Contrast  Result Date: 03/04/2017 CLINICAL DATA:  51 y/o  F; altered mental status and headache. EXAM: CT HEAD WITHOUT CONTRAST TECHNIQUE: Contiguous axial images were obtained from the base of the skull through the vertex without intravenous contrast. COMPARISON:  01/21/2017 MRI head.  07/01/2016 CT head. FINDINGS: Brain: No evidence of acute infarction, hemorrhage, hydrocephalus, extra-axial collection or mass lesion/mass effect. Subcentimeter pituitary lesion on prior MRI is not appreciable on CT. Vascular: No hyperdense vessel or unexpected calcification. Skull: Normal. Negative for fracture or focal lesion. Sinuses/Orbits: No acute finding.  Other: None. IMPRESSION: 1. No acute intracranial abnormality. 2. Subcentimeter pituitary lesion on prior MRI is not appreciable on CT. 3. Otherwise unremarkable stable CT of the head. Electronically Signed   By: Kristine Garbe M.D.   On: 03/04/2017 22:29    Procedures Procedures (including critical care time)  Medications Ordered in ED Medications  ibuprofen (ADVIL,MOTRIN) tablet 600 mg (600 mg Oral Given 03/04/17 2307)     Initial Impression / Assessment and Plan / ED Course  I have reviewed the triage vital signs and the nursing notes.  Pertinent labs & imaging results that were available during my care of the patient were reviewed by me and considered in my medical decision making (see chart for details).    51 y.o. 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 On arrival to room, patient fluttering her eyelids and unresponsive to painful stimuli, mild tachycardia 101, sating 100%. Responsive to ammonia. Following commands during episode. CBG 100.  - CT head NACIA - Chem 8 and CBC unremarkable Pt returned to baseline w/o post-ictal state. No focal neuro deficits. Doubt seizures or other emergent pathology. Pt has appropriate out-patient Psych follow-up. Denies SI/HI or hallucinations. Advised close follow-up with PCP, therapist and given resources to establish care with Neurology. Pt and family voiced understanding and agreement with plan.   Discussed with my attending physician, Dr Reather Converse.   Final Clinical Impressions(s) / ED Diagnoses   Final diagnoses:  Fall, initial encounter    New Prescriptions Discharge Medication List as of 03/04/2017 11:02 PM       Monico Blitz, MD 03/04/17 [place], MD 03/05/17 [phone]

## 2017-03-04 NOTE — ED Notes (Signed)
Pt had episode of altered consciousness when patient was sitting on bench waiting to be registered; pts head fell back, eye lids were fluttering, would not respond to verbal stimuli, extremities limp; RN alerted for help over radio; RN and visitor assisted patient to wheelchair and patient registered and taken to ER room for eval

## 2017-03-04 NOTE — ED Notes (Signed)
Pt not found in lobby x 3 calls

## 2017-03-04 NOTE — ED Triage Notes (Signed)
Pt was seen 4 days ago for fall, co headache since then. Also reports Emesis. Per EMS Hx brain tumor. sts light sensitivity. Alert and oriented x 4.

## 2017-03-04 NOTE — ED Notes (Signed)
CBG CHECKED 100, RN INFORMED

## 2017-03-12 ENCOUNTER — Emergency Department (HOSPITAL_COMMUNITY)
Admission: EM | Admit: 2017-03-12 | Discharge: 2017-03-14 | Disposition: A | Discharge status: Other Acute Inpatient Hospital | Payer: 59 | Attending: Emergency Medicine | Admitting: Emergency Medicine

## 2017-03-12 ENCOUNTER — Encounter (HOSPITAL_COMMUNITY): Payer: Self-pay | Admitting: Emergency Medicine

## 2017-03-12 ENCOUNTER — Emergency Department (HOSPITAL_COMMUNITY): Payer: 59

## 2017-03-12 DIAGNOSIS — F258 Other schizoaffective disorders: Secondary | ICD-10-CM | POA: Insufficient documentation

## 2017-03-12 DIAGNOSIS — Z79899 Other long term (current) drug therapy: Secondary | ICD-10-CM | POA: Diagnosis not present

## 2017-03-12 DIAGNOSIS — F10129 Alcohol abuse with intoxication, unspecified: Secondary | ICD-10-CM | POA: Diagnosis not present

## 2017-03-12 DIAGNOSIS — Z87891 Personal history of nicotine dependence: Secondary | ICD-10-CM | POA: Insufficient documentation

## 2017-03-12 DIAGNOSIS — Z7982 Long term (current) use of aspirin: Secondary | ICD-10-CM | POA: Insufficient documentation

## 2017-03-12 DIAGNOSIS — R404 Transient alteration of awareness: Secondary | ICD-10-CM | POA: Insufficient documentation

## 2017-03-12 DIAGNOSIS — T50904A Poisoning by unspecified drugs, medicaments and biological substances, undetermined, initial encounter: Secondary | ICD-10-CM

## 2017-03-12 DIAGNOSIS — F251 Schizoaffective disorder, depressive type: Secondary | ICD-10-CM | POA: Diagnosis not present

## 2017-03-12 DIAGNOSIS — T43504A Poisoning by unspecified antipsychotics and neuroleptics, undetermined, initial encounter: Secondary | ICD-10-CM | POA: Insufficient documentation

## 2017-03-12 DIAGNOSIS — F10929 Alcohol use, unspecified with intoxication, unspecified: Secondary | ICD-10-CM | POA: Diagnosis not present

## 2017-03-12 DIAGNOSIS — F22 Delusional disorders: Secondary | ICD-10-CM | POA: Insufficient documentation

## 2017-03-12 DIAGNOSIS — J45909 Unspecified asthma, uncomplicated: Secondary | ICD-10-CM | POA: Insufficient documentation

## 2017-03-12 DIAGNOSIS — F4310 Post-traumatic stress disorder, unspecified: Secondary | ICD-10-CM | POA: Diagnosis present

## 2017-03-12 DIAGNOSIS — T43594A Poisoning by other antipsychotics and neuroleptics, undetermined, initial encounter: Secondary | ICD-10-CM | POA: Diagnosis not present

## 2017-03-12 DIAGNOSIS — F1010 Alcohol abuse, uncomplicated: Secondary | ICD-10-CM | POA: Diagnosis present

## 2017-03-12 DIAGNOSIS — T426X4A Poisoning by other antiepileptic and sedative-hypnotic drugs, undetermined, initial encounter: Secondary | ICD-10-CM | POA: Diagnosis not present

## 2017-03-12 LAB — COMPREHENSIVE METABOLIC PANEL
ALT: 26 U/L (ref 14–54)
AST: 23 U/L (ref 15–41)
Albumin: 4 g/dL (ref 3.5–5.0)
Alkaline Phosphatase: 38 U/L (ref 38–126)
Anion gap: 9 (ref 5–15)
BUN: 6 mg/dL (ref 6–20)
CHLORIDE: 109 mmol/L (ref 101–111)
CO2: 23 mmol/L (ref 22–32)
CREATININE: 0.61 mg/dL (ref 0.44–1.00)
Calcium: 8.7 mg/dL — ABNORMAL LOW (ref 8.9–10.3)
GFR calc Af Amer: >60 mL/min (ref >60)
GFR calc non Af Amer: >60 mL/min (ref >60)
Glucose, Bld: 103 mg/dL — ABNORMAL HIGH (ref 65–99)
Potassium: 3 mmol/L — ABNORMAL LOW (ref 3.5–5.1)
Sodium: 141 mmol/L (ref 135–145)
Total Bilirubin: 0.3 mg/dL (ref 0.3–1.2)
Total Protein: 6.6 g/dL (ref 6.5–8.1)

## 2017-03-12 LAB — CBC
HCT: 38 % (ref 36.0–46.0)
Hemoglobin: 13.3 g/dL (ref 12.0–15.0)
MCH: 30.9 pg (ref 26.0–34.0)
MCHC: 35 g/dL (ref 30.0–36.0)
MCV: 88.2 fL (ref 78.0–100.0)
PLATELETS: 278 10*3/uL (ref 150–400)
RBC: 4.31 MIL/uL (ref 3.87–5.11)
RDW: 12.9 % (ref 11.5–15.5)
WBC: 4.6 10*3/uL (ref 4.0–10.5)

## 2017-03-12 LAB — RAPID URINE DRUG SCREEN, HOSP PERFORMED
Amphetamines: NOT DETECTED
BARBITURATES: NOT DETECTED
Benzodiazepines: NOT DETECTED
Cocaine: NOT DETECTED
OPIATES: NOT DETECTED
TETRAHYDROCANNABINOL: NOT DETECTED

## 2017-03-12 LAB — I-STAT TROPONIN, ED: Troponin i, poc: 0 ng/mL (ref 0.00–0.08)

## 2017-03-12 LAB — PREGNANCY, URINE: Preg Test, Ur: NEGATIVE

## 2017-03-12 LAB — SALICYLATE LEVEL

## 2017-03-12 LAB — ETHANOL: Alcohol, Ethyl (B): 85 mg/dL — ABNORMAL HIGH (ref <5)

## 2017-03-12 LAB — ACETAMINOPHEN LEVEL: Acetaminophen (Tylenol), Serum: <10 ug/mL — ABNORMAL LOW (ref 10–30)

## 2017-03-12 MED ORDER — AMMONIA AROMATIC IN INHA
RESPIRATORY_TRACT | Status: AC
  Filled 2017-03-12: qty 10

## 2017-03-12 MED ORDER — NALOXONE HCL 2 MG/2ML IJ SOSY
PREFILLED_SYRINGE | INTRAMUSCULAR | Status: AC
  Filled 2017-03-12: qty 2

## 2017-03-12 MED ORDER — AMMONIA AROMATIC IN INHA
0.3000 mL | Freq: Once | RESPIRATORY_TRACT | Status: AC
  Administered 2017-03-12: 0.3 mL via RESPIRATORY_TRACT
  Filled 2017-03-12: qty 20

## 2017-03-12 NOTE — BH Assessment (Signed)
Per Lindon Romp, NP pt meets criteria for inpt treatment. TTS to seek placement. Report given to Arman Bogus, RN.  Lind Covert, LCSWA TTS Specialist

## 2017-03-12 NOTE — ED Triage Notes (Signed)
Pt via EMS from home, found unresponsive by daughter in bed.  EMS arrived, pt had pulse and was breathing but not responsive  Gave 1 mg Narcan IN, pt woke up.  Denies SI but hx anxiety.  A&Ox4.  Reports taking 4 Risperdal tablets and unknown amount of Zolpidem Tartrate tablets as well as some alcohol 'because I wanted to sleep'.  [place] VS: 128/72, 108 bpm, 134 CBG, 18 RR, 100% RA

## 2017-03-12 NOTE — ED Notes (Signed)
Bernice from [facility] called back to check up on pt.  Gave report.

## 2017-03-12 NOTE — ED Notes (Signed)
Pt ambulatory w/steady independent gait.  A&Ox4.

## 2017-03-12 NOTE — ED Notes (Signed)
Pt would not respond to CT tech's commands.  Pt given 0.3 ml ammonia IN by Alphonse Guild RN.  Pt woke up and followed CT's commands.  Pt still A&Ox4 and VS stable.

## 2017-03-12 NOTE — ED Provider Notes (Signed)
[facility] DEPT Provider Note   CSN: [id number] Arrival date & time: 03/12/17  1936   By signing my name below, I, Deborah Pena, attest that this documentation has been prepared under the direction and in the presence of Deborah Tremaine Fuhriman, PA-C Electronically Signed: [place], ED Scribe. 03/12/17. 8:00 PM.  History   Chief Complaint Chief Complaint  Patient presents with  . Drug Overdose   Level V caveat: AMS   HPI Deborah Pena is a 51 y.o. female with a PMHx of schizoaffective disorder, panic disorder, panic disorder, who presents to the Emergency Department BIB EMS complaining of drug overdose onset PTA. I was called to bedside by nursing staff who report the patient is unresponsive. Patient responded to a pinch to her finger. Pt notes that she took 4 risperidone, an unknown amount of Ambien, and consumed ETOH today PTA. When asked why she consumed the risperidone, she states "I took it to help me sleep, I don't sleep well." Pt reports that she didn't take any other medications. Per EMS report, pt was found unresponsive in bed by her daughter. EMS gave the pt 1 mg of internasal narcan to which the pt responded somewhat with some "shaking."  She was breathing spontaneously and had a pulse per EMS.  EMS also reports they believe her current responsiveness is related to behavioral issues.  Pt states associated CP x 2 weeks that is not radiating and denies SOB.  Denies vomiting, nausea, pain, and any other symptoms. She cooperates with some ROS questions and then refuses other questions.    The history is provided by the patient, medical records and the EMS personnel. No language interpreter was used.    Past Medical History:  Diagnosis Date  . Anemia   . Anginal pain ([place])   . Anxiety   . Arthritis    "left hand" (04/26/2016)  . Asthma   . Chronic lower back pain   . Diverticulitis of colon   . GERD (gastroesophageal reflux disease)   . Headache    'once or twice/week" (04/26/2016)    . Migraine    "twice/month" (04/26/2016)  . Stomach ulcer     Patient Active Problem List   Diagnosis Date Noted  . Schizoaffective disorder, depressive type ([place]) 01/16/2017  . PTSD (post-traumatic stress disorder) 01/16/2017  . Panic disorder 01/16/2017  . Acute diverticulitis 04/26/2016  . Diverticulitis of large intestine with abscess without bleeding 04/26/2016  . Intractable migraine 07/27/2014    Past Surgical History:  Procedure Laterality Date  . ABDOMINAL HYSTERECTOMY  2013   preformed at [facility] by Dr Jacqualyn Posey in April  . BREAST SURGERY Bilateral ~ 2000   "dual lateral duct removal"   . BUNIONECTOMY Right 2013  . CESAREAN SECTION  1999  . OOPHORECTOMY Right 2013  . TONSILLECTOMY      OB History    No data available       Home Medications    Prior to Admission medications   Medication Sig Start Date End Date Taking? Authorizing Provider  ALPRAZolam Duanne Moron) 1 MG tablet Take 1 mg by mouth 3 (three) times daily as needed for anxiety.    Yes Historical Provider, MD  aspirin EC 81 MG tablet Take 81 mg by mouth daily.   Yes Historical Provider, MD  clonazePAM (KLONOPIN) 0.5 MG tablet Take 1 tablet (0.5 mg total) by mouth 2 (two) times daily as needed (severe anxiety). 01/21/17  Yes Encarnacion Slates, NP  divalproex (DEPAKOTE ER) 500 MG 24 hr  tablet Take 1 tablet (500 mg total) by mouth at bedtime. For mood stabilization 01/21/17  Yes Encarnacion Slates, NP  ibuprofen (ADVIL) 200 MG tablet Take 400 mg by mouth every 6 (six) hours as needed (for headaches).   Yes Historical Provider, MD  Multiple Vitamin (MULTIVITAMIN WITH MINERALS) TABS tablet Take 1 tablet by mouth daily.   Yes Historical Provider, MD  sertraline (ZOLOFT) 100 MG tablet Take 1 tablet (100 mg total) by mouth daily. For depression 01/22/17  Yes Encarnacion Slates, NP  traZODone (DESYREL) 50 MG tablet Take 1 tablet (50 mg total) by mouth at bedtime as needed for sleep. 01/21/17  Yes Encarnacion Slates, NP  acetaminophen (TYLENOL)  325 MG tablet Take 2 tablets (650 mg total) by mouth every 6 (six) hours as needed. For fever/pain Patient not taking: Reported on 03/12/2017 01/21/17   Encarnacion Slates, NP  hydrOXYzine (ATARAX/VISTARIL) 25 MG tablet Take 1 tablet (25 mg total) by mouth 3 (three) times daily as needed for anxiety. Patient not taking: Reported on 03/04/2017 01/21/17   Encarnacion Slates, NP  lurasidone (LATUDA) 20 MG TABS tablet Take 1 tablet (20 mg total) by mouth at bedtime. For mood control Patient not taking: Reported on 03/04/2017 01/21/17   Encarnacion Slates, NP  nicotine (NICODERM CQ - DOSED IN MG/24 HOURS) 21 mg/24hr patch Place 1 patch (21 mg total) onto the skin daily at 6 (six) AM. For smoking cessation Patient not taking: Reported on 03/04/2017 01/22/17   Encarnacion Slates, NP    Family History Family History  Problem Relation Age of Onset  . Heart failure Father   . Diabetes Other   . Breast cancer Other   . Diverticulitis Other   . Mental illness Paternal Grandmother   . Mental illness Paternal Grandfather   . Schizophrenia Paternal Aunt   . Suicidality Maternal Aunt     Social History Social History  Substance Use Topics  . Smoking status: Former Smoker    Packs/day: 2.00    Years: 30.00    Types: Cigarettes  . Smokeless tobacco: Former Systems developer    Types: Chew     Comment: "quit smoking cigarettes in ~ 2011; chewed when I was little"  . Alcohol use No     Allergies   Buprenorphine hcl; Ciprofloxacin; Codeine; and Propranolol   Review of Systems Review of Systems  Unable to perform ROS: Mental status change  Constitutional: Negative for fever.  Eyes: Negative for visual disturbance.  Respiratory: Negative for shortness of breath.   Cardiovascular: Positive for chest pain.  Gastrointestinal: Negative for nausea and vomiting.  Musculoskeletal: Negative for arthralgias and myalgias.  Psychiatric/Behavioral: Positive for behavioral problems, hallucinations and suicidal ideas.     Physical  Exam Updated Vital Signs BP 120/72 (BP Location: Left Arm)   Pulse 100   Resp 14   Ht 5\' 2"  (1.575 m)   Wt 88.5 kg   SpO2 99%   BMI 35.67 kg/m   Physical Exam  Constitutional: She is oriented to person, place, and time. She appears well-developed and well-nourished. No distress.  Nontoxic appearing.  HENT:  Head: Normocephalic and atraumatic.  Right Ear: External ear normal.  Left Ear: External ear normal.  Mouth/Throat: Oropharynx is clear and moist.  Eyes: Conjunctivae and EOM are normal. Pupils are equal, round, and reactive to light. Right eye exhibits no discharge. Left eye exhibits no discharge.  Pinpoint pupils bilaterally  Neck: Normal range of motion. Neck supple. No JVD  present. No tracheal deviation present.  Cardiovascular: Normal rate, regular rhythm, normal heart sounds and intact distal pulses.  Exam reveals no gallop and no friction rub.   No murmur heard. Pulmonary/Chest: Effort normal and breath sounds normal. No stridor. No respiratory distress. She has no wheezes. She has no rales.  Lungs clear auscultation bilaterally.  Abdominal: Soft. There is no tenderness.  Abdomen is soft and nontender to palpation.  Musculoskeletal: She exhibits no edema.  Lymphadenopathy:    She has no cervical adenopathy.  Neurological: She is alert and oriented to person, place, and time. No cranial nerve deficit or sensory deficit. She exhibits normal muscle tone. Coordination normal.  Initially, patient found in resuscitation bay lying with her eyes closed and not responding to verbal stimuli. With a pinch to her finger she began to respond to myself. Pupils are pinpoint. Briefly she flops her arms around like they are flaccid and then pulls herself up right in the bed without difficulty so I can listen to her lungs. Patient is spontaneously moving all extremities in a coordinated fashion exhibiting good strength. She opens her eyes completely and responds coherently to my questioning  until she refuses to answer more questions. She follows my commands after awaking and generally cooperates with my exam. When cooperating her speech is clear and coherent and she does not appear intoxicated.   Skin: Skin is warm and dry. Capillary refill takes less than 2 seconds. No rash noted. She is not diaphoretic. No erythema. No pallor.  Psychiatric: She exhibits a depressed mood.  She appears depressed and endorses depressed mood. She tells me she feels she is going to die for the past 2 weeks. She tells me she feels like her breathing and heart is stopping. She endorses delusions of herself dying. She endorses command auditory hallucinations to kill herself. She does not appear to be responding to internal stimuli. Her speech is clear and coherent and she does not appear intoxicated.   Nursing note and vitals reviewed.    ED Treatments / Results  DIAGNOSTIC STUDIES:   COORDINATION OF CARE:    Labs (all labs ordered are listed, but only abnormal results are displayed) Labs Reviewed  COMPREHENSIVE METABOLIC PANEL - Abnormal; Notable for the following:       Result Value   Potassium 3.0 (*)    Glucose, Bld 103 (*)    Calcium 8.7 (*)    All other components within normal limits  ETHANOL - Abnormal; Notable for the following:    Alcohol, Ethyl (B) 85 (*)    All other components within normal limits  ACETAMINOPHEN LEVEL - Abnormal; Notable for the following:    Acetaminophen (Tylenol), Serum <10 (*)    All other components within normal limits  CBC  RAPID URINE DRUG SCREEN, HOSP PERFORMED  PREGNANCY, URINE  SALICYLATE LEVEL  I-STAT TROPOININ, ED    EKG  EKG Interpretation  Date/Time:  Saturday March 12 2017 19:55:27 EDT Ventricular Rate:  102 PR Interval:    QRS Duration: 92 QT Interval:  349 QTC Calculation: 455 R Axis:   19 Text Interpretation:  Sinus tachycardia Probable left atrial enlargement No acute changes  Confirmed by LIU MD, DANA [phone]) on 03/13/2017  12:15:04 AM       Radiology Dg Chest 2 View  Result Date: 03/12/2017 CLINICAL DATA:  51 year old female found unresponsive at home. Initial encounter. EXAM: CHEST  2 VIEW COMPARISON:  02/07/2017 and earlier. FINDINGS: Semi upright AP and lateral views of the  chest. Low lung volumes with mild crowding of markings. No pneumothorax, pulmonary edema, pleural effusion or other confluent pulmonary opacity. Normal cardiac size and mediastinal contours. Visualized tracheal air column is within normal limits. No acute osseous abnormality identified. Negative visible bowel gas pattern. IMPRESSION: Low lung volumes with mild atelectasis. No other acute cardiopulmonary abnormality. Electronically Signed   By: Genevie Ann M.D.   On: 03/12/2017 21:00    Procedures Procedures (including critical care time)  Medications Ordered in ED Medications  ammonia inhalant (not administered)  alum & mag hydroxide-simeth (MAALOX/MYLANTA) 200-200-20 MG/5ML suspension 30 mL (not administered)  ondansetron (ZOFRAN) tablet 4 mg (not administered)  nicotine (NICODERM CQ - dosed in mg/24 hours) patch 21 mg (not administered)  ibuprofen (ADVIL,MOTRIN) tablet 600 mg (not administered)  acetaminophen (TYLENOL) tablet 650 mg (not administered)  LORazepam (ATIVAN) tablet 1 mg (not administered)  aspirin EC tablet 81 mg (not administered)  divalproex (DEPAKOTE ER) 24 hr tablet 500 mg (not administered)  lurasidone (LATUDA) tablet 20 mg (not administered)  sertraline (ZOLOFT) tablet 100 mg (not administered)  traZODone (DESYREL) tablet 50 mg (not administered)  ammonia inhalant 0.3 mL (0.3 mLs Inhalation Given 03/12/17 2100)     Initial Impression / Assessment and Plan / ED Course  I have reviewed the triage vital signs and the nursing notes.  Pertinent labs & imaging results that were available during my care of the patient were reviewed by me and considered in my medical decision making (see chart for details).    This   is a 51 y.o. female with a PMHx of schizoaffective disorder, panic disorder, panic disorder, who presents to the Emergency Department BIB EMS Has altered mental status. Concern for overdose. I was called to bedside by nursing staff for an unresponsive patient. On my exam patient is lying on the stretcher with her eye shut. Pulses are intact and she is breathing spontaneously. With a pinch to her finger she awakes and answers most of my questions.   During the initial interview patient has a period where she pretends to be flaccid and flops backwards onto the bed. When I explained to her needed her to sit upright second listen to her lungs and I explained to her that I knew she could pull herself up she quickly pulled herself upright and opened her eyes. Her transient alteration in awareness appears to be behavioral. Her speech is clear and coherent and she does not appear intoxicated during evaluation. She endorses depressed mood and auditory hallucinations. She reports feeling this way for about 2 weeks. She endorses some chest pain for 2 weeks that is nonradiating and denies shortness of breath. She reports taking for Risperdal tablets, and she reports being prescribed 3 Risperdal tablets. She denies taking Ambien to myself. She tells me she also drinks some alcohol, because she wanted to sleep. Patient reports she is voluntary and willing to stand to the hospital if behavioral health deems this appropriate treatment. Poison control recommended to monitor for CNS depression, check acetaminophen and salicylate level and observe for at least 6 hours.  Later, I was called to radiology department where the report there was an unresponsive patient and the radiology department. Patient was found lying supine on the stretcher with her eyes shut and unresponsive to x-ray technicians commands or questioning. She reacted to Dr. Wilma Flavin stimulation and when I spoke to her she sat up and spoke coherently immediately.  No evidence of syncope. This appears to be behavioral.   Blood work  here is remarkable only for an alcohol level of 85. Troponin is not elevated. EKG is without evidence of study. Chest x-ray shows no acute cardiopulmonary disease.  Behavioral Health recommends inpatient admission. They are awaiting placement.   Patient is medically clear for Behavioral Health disposition. She is voluntary. Psych holding orders placed and home medications reordered.      Final Clinical Impressions(s) / ED Diagnoses   Final diagnoses:  Drug overdose, undetermined intent, initial encounter  Alcoholic intoxication with complication ([place])  Delusions ([place])  Other schizoaffective disorders ([place])  Transient alteration of awareness    New Prescriptions New Prescriptions   No medications on file   I personally performed the services described in this documentation, which was scribed in my presence. The recorded information has been reviewed and is accurate.      Waynetta Pean, PA-C 03/13/17 [id number]    Forde Dandy, MD 03/13/17 [phone]

## 2017-03-12 NOTE — ED Notes (Addendum)
Called PC Monitor for CNS depression Check Tylenol level & usual SI screenings Watch for 6 hours

## 2017-03-12 NOTE — ED Notes (Signed)
Pt aware that we need urine specimen and said she could not void at this time. Said she will let staff know when she is able to void.
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
## 2017-03-13 DIAGNOSIS — Z79899 Other long term (current) drug therapy: Secondary | ICD-10-CM | POA: Diagnosis not present

## 2017-03-13 DIAGNOSIS — T43594A Poisoning by other antipsychotics and neuroleptics, undetermined, initial encounter: Secondary | ICD-10-CM

## 2017-03-13 DIAGNOSIS — Z87891 Personal history of nicotine dependence: Secondary | ICD-10-CM

## 2017-03-13 DIAGNOSIS — F1010 Alcohol abuse, uncomplicated: Secondary | ICD-10-CM | POA: Diagnosis present

## 2017-03-13 DIAGNOSIS — R45851 Suicidal ideations: Secondary | ICD-10-CM

## 2017-03-13 DIAGNOSIS — Z818 Family history of other mental and behavioral disorders: Secondary | ICD-10-CM

## 2017-03-13 DIAGNOSIS — T50901A Poisoning by unspecified drugs, medicaments and biological substances, accidental (unintentional), initial encounter: Secondary | ICD-10-CM | POA: Insufficient documentation

## 2017-03-13 DIAGNOSIS — F10929 Alcohol use, unspecified with intoxication, unspecified: Secondary | ICD-10-CM | POA: Insufficient documentation

## 2017-03-13 DIAGNOSIS — T426X4A Poisoning by other antiepileptic and sedative-hypnotic drugs, undetermined, initial encounter: Secondary | ICD-10-CM

## 2017-03-13 DIAGNOSIS — F251 Schizoaffective disorder, depressive type: Secondary | ICD-10-CM | POA: Diagnosis not present

## 2017-03-13 MED ORDER — SERTRALINE HCL 50 MG PO TABS
100.0000 mg | ORAL_TABLET | Freq: Every day | ORAL | Status: DC
  Administered 2017-03-13 – 2017-03-14 (×2): 100 mg via ORAL
  Filled 2017-03-13 (×2): qty 2

## 2017-03-13 MED ORDER — HYDROXYZINE HCL 25 MG PO TABS
25.0000 mg | ORAL_TABLET | Freq: Four times a day (QID) | ORAL | Status: DC | PRN
  Administered 2017-03-13 – 2017-03-14 (×2): 25 mg via ORAL
  Filled 2017-03-13 (×2): qty 1

## 2017-03-13 MED ORDER — NICOTINE 21 MG/24HR TD PT24
21.0000 mg | MEDICATED_PATCH | Freq: Every day | TRANSDERMAL | Status: DC
  Administered 2017-03-13 – 2017-03-14 (×2): 21 mg via TRANSDERMAL
  Filled 2017-03-13 (×2): qty 1

## 2017-03-13 MED ORDER — LORAZEPAM 1 MG PO TABS: 1.0000 mg | ORAL_TABLET | Freq: Three times a day (TID) | ORAL | Status: DC | PRN

## 2017-03-13 MED ORDER — DIVALPROEX SODIUM ER 500 MG PO TB24
500.0000 mg | ORAL_TABLET | Freq: Two times a day (BID) | ORAL | Status: DC
  Administered 2017-03-13 – 2017-03-14 (×2): 500 mg via ORAL
  Filled 2017-03-13 (×2): qty 1

## 2017-03-13 MED ORDER — TRAZODONE HCL 50 MG PO TABS
50.0000 mg | ORAL_TABLET | Freq: Every evening | ORAL | Status: DC | PRN
  Administered 2017-03-13: 50 mg via ORAL
  Filled 2017-03-13: qty 1

## 2017-03-13 MED ORDER — ACETAMINOPHEN 325 MG PO TABS: 650.0000 mg | ORAL_TABLET | ORAL | Status: DC | PRN

## 2017-03-13 MED ORDER — DIVALPROEX SODIUM ER 500 MG PO TB24
500.0000 mg | ORAL_TABLET | Freq: Every day | ORAL | Status: DC
  Administered 2017-03-13: 500 mg via ORAL
  Filled 2017-03-13: qty 1

## 2017-03-13 MED ORDER — ASPIRIN EC 81 MG PO TBEC
81.0000 mg | DELAYED_RELEASE_TABLET | Freq: Every day | ORAL | Status: DC
  Administered 2017-03-13 – 2017-03-14 (×2): 81 mg via ORAL
  Filled 2017-03-13 (×3): qty 1

## 2017-03-13 MED ORDER — ONDANSETRON HCL 4 MG PO TABS: 4.0000 mg | ORAL_TABLET | Freq: Three times a day (TID) | ORAL | Status: DC | PRN

## 2017-03-13 MED ORDER — IBUPROFEN 200 MG PO TABS: 600.0000 mg | ORAL_TABLET | Freq: Three times a day (TID) | ORAL | Status: DC | PRN

## 2017-03-13 MED ORDER — ALUM & MAG HYDROXIDE-SIMETH 200-200-20 MG/5ML PO SUSP: 30.0000 mL | ORAL | Status: DC | PRN

## 2017-03-13 MED ORDER — LURASIDONE HCL 20 MG PO TABS
20.0000 mg | ORAL_TABLET | Freq: Every day | ORAL | Status: DC
  Administered 2017-03-13 (×2): 20 mg via ORAL
  Filled 2017-03-13 (×2): qty 1

## 2017-03-13 MED ORDER — GABAPENTIN 100 MG PO CAPS
100.0000 mg | ORAL_CAPSULE | Freq: Two times a day (BID) | ORAL | Status: DC
  Administered 2017-03-13 – 2017-03-14 (×3): 100 mg via ORAL
  Filled 2017-03-13 (×3): qty 1

## 2017-03-13 NOTE — ED Notes (Signed)
Patient ate 75% of lunch

## 2017-03-13 NOTE — ED Notes (Signed)
Bed: [facility] Expected date:  Expected time:  Means of arrival:  Comments: Deborah Pena
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
## 2017-03-13 NOTE — ED Notes (Signed)
Pt forwards little with this nurse.Compliant with prescribed medication regimen. Pt presents with sad affect, denies SI/HI. Endorsing AH. Encouragement and support provided. PO intake encouraged.Special checks q 15 mins in place for safety. Video monitoring in place. Will continue to monitor.

## 2017-03-13 NOTE — ED Notes (Signed)
Called EDP rapid pulse, EDP recommended to encourage fluids and food and keep track.

## 2017-03-13 NOTE — BH Assessment (Signed)
Faxed clinical information to the following facilities for placement:  [facility]   [address] [place], [facility], [facility], [facility] Triage Specialist [phone]

## 2017-03-13 NOTE — ED Notes (Signed)
Pt arrived to unit. SBAR Report received from previous nurse. Pt received calm and visible on unit. Pt denies current SI/ HI, A/V H, depression, anxiety, or pain at this time, and appears otherwise stable and free of distress but is tired. Pt reminded of camera surveillance, q 15 min rounds, and rules of the milieu. Will continue to assess.

## 2017-03-14 NOTE — ED Notes (Signed)
Patient has bizarre affect and intense stare.  Patient states, "I just took a few pills, that's all."  Patient does not feel she needs to go to [facility].  She states, "I was just there 3 weeks ago and they never get my medication right.  Patient denies any thoughts of self harm.  She denies any HI/AVH at this time.  Patient is currently cooperative and states she will bo to [facility] rather than be involuntarily committed.

## 2017-03-14 NOTE — BH Assessment (Signed)
Roderic Palau from [facility] called to report the pt has been accepted to [facility]. Accepting is Dr. Kathlene Cote, MD. Pt able to be transported after 10am to bed 351. call to report [phone].   Lind Covert, LCSWA TTS Specialist

## 2017-03-14 NOTE — ED Notes (Signed)
[facility] transportation called for transport to [facility].  Patient is cooperative and agrees to go.

## 2017-03-14 NOTE — Progress Notes (Signed)
03/14/17 1357:  LRT went to pt room to offer activities, pt was sleep.  [name], LRT/CTRS

## 2017-03-14 NOTE — ED Notes (Addendum)
Patient was sleeping heavily when the [facility] driver arrived.  Patient was slow to get up and had to be prompted a couple of times.  Patient finally agreed to come with the driver to go to [facility].  Patient is agreeable to go at this time.  She left ambulatory with the driver from [place].

## 2017-07-17 ENCOUNTER — Emergency Department (HOSPITAL_COMMUNITY)
Admission: EM | Admit: 2017-07-17 | Discharge: 2017-07-17 | Disposition: A | Discharge status: Home / Self Care | Payer: 59 | Attending: Emergency Medicine | Admitting: Emergency Medicine

## 2017-07-17 DIAGNOSIS — F445 Conversion disorder with seizures or convulsions: Secondary | ICD-10-CM

## 2017-07-17 DIAGNOSIS — Z79899 Other long term (current) drug therapy: Secondary | ICD-10-CM | POA: Insufficient documentation

## 2017-07-17 DIAGNOSIS — Z87891 Personal history of nicotine dependence: Secondary | ICD-10-CM | POA: Diagnosis not present

## 2017-07-17 DIAGNOSIS — R55 Syncope and collapse: Secondary | ICD-10-CM | POA: Diagnosis present

## 2017-07-17 LAB — CBC WITH DIFFERENTIAL/PLATELET
Basophils Absolute: 0 10*3/uL (ref 0.0–0.1)
Basophils Relative: 0 %
Eosinophils Absolute: 0 10*3/uL (ref 0.0–0.7)
Eosinophils Relative: 0 %
HEMATOCRIT: 39.5 % (ref 36.0–46.0)
Hemoglobin: 13.7 g/dL (ref 12.0–15.0)
LYMPHS PCT: 29 %
Lymphs Abs: 1 10*3/uL (ref 0.7–4.0)
MCH: 31.8 pg (ref 26.0–34.0)
MCHC: 34.7 g/dL (ref 30.0–36.0)
MCV: 91.6 fL (ref 78.0–100.0)
Monocytes Absolute: 0.4 10*3/uL (ref 0.1–1.0)
Monocytes Relative: 11 %
NEUTROS ABS: 2.1 10*3/uL (ref 1.7–7.7)
Neutrophils Relative %: 60 %
Platelets: 242 10*3/uL (ref 150–400)
RBC: 4.31 MIL/uL (ref 3.87–5.11)
RDW: 13.1 % (ref 11.5–15.5)
WBC: 3.5 10*3/uL — AB (ref 4.0–10.5)

## 2017-07-17 LAB — COMPREHENSIVE METABOLIC PANEL
ALK PHOS: 53 U/L (ref 38–126)
ALT: 32 U/L (ref 14–54)
ANION GAP: 11 (ref 5–15)
AST: 24 U/L (ref 15–41)
Albumin: 4.3 g/dL (ref 3.5–5.0)
BILIRUBIN TOTAL: 0.2 mg/dL — AB (ref 0.3–1.2)
BUN: 6 mg/dL (ref 6–20)
CALCIUM: 9.2 mg/dL (ref 8.9–10.3)
CO2: 21 mmol/L — ABNORMAL LOW (ref 22–32)
CREATININE: 0.68 mg/dL (ref 0.44–1.00)
Chloride: 111 mmol/L (ref 101–111)
GFR calc Af Amer: >60 mL/min (ref >60)
GFR calc non Af Amer: >60 mL/min (ref >60)
GLUCOSE: 121 mg/dL — AB (ref 65–99)
Potassium: 4.3 mmol/L (ref 3.5–5.1)
Sodium: 143 mmol/L (ref 135–145)
TOTAL PROTEIN: 7.4 g/dL (ref 6.5–8.1)

## 2017-07-17 LAB — VALPROIC ACID LEVEL: Valproic Acid Lvl: <10 ug/mL — ABNORMAL LOW (ref 50.0–100.0)

## 2017-07-17 MED ORDER — VALPROATE SODIUM 500 MG/5ML IV SOLN
500.0000 mg | Freq: Once | INTRAVENOUS | Status: AC
  Administered 2017-07-17: 500 mg via INTRAVENOUS
  Filled 2017-07-17: qty 5

## 2017-07-17 MED ORDER — LORAZEPAM 2 MG/ML IJ SOLN
1.0000 mg | Freq: Once | INTRAMUSCULAR | Status: AC
  Administered 2017-07-17: 1 mg via INTRAVENOUS
  Filled 2017-07-17: qty 1

## 2017-07-17 NOTE — ED Notes (Signed)
Bed: OM76 Expected date:  Expected time:  Means of arrival:  Comments: 51 yo syncope, pseudoseizures

## 2017-07-17 NOTE — ED Notes (Signed)
Pt states that she continues to feel shaky. MD made aware who stated that he would reevaluate after depakote infusion finishes.

## 2017-07-17 NOTE — ED Provider Notes (Signed)
[facility] DEPT Provider Note   CSN: [id number] Arrival date & time: 07/17/17  1518     History   Chief Complaint Chief Complaint  Patient presents with  . Loss of Consciousness  . Pseudoseizure    HPI Deborah Pena is a 51 y.o. female.  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 She ran out 4 days ago because she used to much of it. Patient has pseudoseizure today seizure   The history is provided by the patient. No language interpreter was used.  Seizures   This is a new problem. The current episode started 1 to 2 hours ago. The problem has been resolved. There was 1 seizure. Pertinent negatives include no headaches, no chest pain, no cough and no diarrhea. Characteristics do not include eye blinking. The episode was witnessed. There was the sensation of an aura present. The seizures did not continue in the ED. The seizure(s) had no focality. Possible causes include medication or dosage change.    Past Medical History:  Diagnosis Date  . Anemia   . Anginal pain ([place])   . Anxiety   . Arthritis    "left hand" (04/26/2016)  . Asthma   . Chronic lower back pain   . Diverticulitis of colon   . GERD (gastroesophageal reflux disease)   . Headache    'once or twice/week" (04/26/2016)  . Migraine    "twice/month" (04/26/2016)  . Stomach ulcer     Patient Active Problem List   Diagnosis Date Noted  . Alcohol abuse 03/13/2017  . Drug overdose   . Schizoaffective disorder, depressive type ([place]) 01/16/2017  . PTSD (post-traumatic stress disorder) 01/16/2017  . Panic disorder 01/16/2017  . Acute diverticulitis 04/26/2016  . Diverticulitis of large intestine with abscess without bleeding 04/26/2016  . Intractable migraine 07/27/2014    Past Surgical History:  Procedure Laterality Date  . ABDOMINAL HYSTERECTOMY  2013   preformed at [facility] by Dr Jacqualyn Posey in April  . BREAST SURGERY Bilateral ~ 2000   "dual lateral duct removal"   . BUNIONECTOMY  Right 2013  . CESAREAN SECTION  1999  . OOPHORECTOMY Right 2013  . TONSILLECTOMY      OB History    No data available       Home Medications    Prior to Admission medications   Medication Sig Start Date End Date Taking? Authorizing Provider  ALPRAZolam Duanne Moron) 1 MG tablet Take 1 mg by mouth 3 (three) times daily as needed for anxiety.    Yes [provider]  aspirin EC 81 MG tablet Take 81 mg by mouth daily.   Yes [provider]  clonazePAM (KLONOPIN) 0.5 MG tablet Take 1 tablet (0.5 mg total) by mouth 2 (two) times daily as needed (severe anxiety). 01/21/17  Yes Lindell Spar I, NP  divalproex (DEPAKOTE ER) 500 MG 24 hr tablet Take 1 tablet (500 mg total) by mouth at bedtime. For mood stabilization 01/21/17  Yes Nwoko, Herbert Pun I, NP  ibuprofen (ADVIL) 200 MG tablet Take 400 mg by mouth every 6 (six) hours as needed (for headaches).   Yes [provider]  Multiple Vitamin (MULTIVITAMIN WITH MINERALS) TABS tablet Take 1 tablet by mouth daily.   Yes [provider]  acetaminophen (TYLENOL) 325 MG tablet Take 2 tablets (650 mg total) by mouth every 6 (six) hours as needed. For fever/pain Patient not taking: Reported on 03/12/2017 01/21/17   Lindell Spar I, NP  hydrOXYzine (ATARAX/VISTARIL) 25 MG tablet Take  1 tablet (25 mg total) by mouth 3 (three) times daily as needed for anxiety. Patient not taking: Reported on 03/04/2017 01/21/17   Lindell Spar I, NP  lurasidone (LATUDA) 20 MG TABS tablet Take 1 tablet (20 mg total) by mouth at bedtime. For mood control Patient not taking: Reported on 03/04/2017 01/21/17   Lindell Spar I, NP  nicotine (NICODERM CQ - DOSED IN MG/24 HOURS) 21 mg/24hr patch Place 1 patch (21 mg total) onto the skin daily at 6 (six) AM. For smoking cessation Patient not taking: Reported on 03/04/2017 01/22/17   Lindell Spar I, NP  sertraline (ZOLOFT) 100 MG tablet Take 1 tablet (100 mg total) by mouth daily. For depression Patient not taking: Reported on  07/17/2017 01/22/17   Lindell Spar I, NP  traZODone (DESYREL) 50 MG tablet Take 1 tablet (50 mg total) by mouth at bedtime as needed for sleep. Patient not taking: Reported on 07/17/2017 01/21/17   Lindell Spar I, NP    Family History Family History  Problem Relation Age of Onset  . Heart failure Father   . Diabetes Other   . Breast cancer Other   . Diverticulitis Other   . Mental illness Paternal Grandmother   . Mental illness Paternal Grandfather   . Schizophrenia Paternal Aunt   . Suicidality Maternal Aunt     Social History Social History  Substance Use Topics  . Smoking status: Former Smoker    Packs/day: 2.00    Years: 30.00    Types: Cigarettes  . Smokeless tobacco: Former Systems developer    Types: Chew     Comment: "quit smoking cigarettes in ~ 2011; chewed when I was little"  . Alcohol use No     Allergies   Buprenorphine hcl; Ciprofloxacin; Codeine; and Propranolol   Review of Systems Review of Systems  Constitutional: Negative for appetite change and fatigue.  HENT: Negative for congestion, ear discharge and sinus pressure.   Eyes: Negative for discharge.  Respiratory: Negative for cough.   Cardiovascular: Negative for chest pain.  Gastrointestinal: Negative for abdominal pain and diarrhea.  Genitourinary: Negative for frequency and hematuria.  Musculoskeletal: Negative for back pain.  Skin: Negative for rash.  Neurological: Positive for seizures. Negative for headaches.  Psychiatric/Behavioral: Negative for hallucinations.     Physical Exam Updated Vital Signs BP 122/75   Pulse (!) 115   Temp 98.8 F (37.1 C) (Oral)   Resp 20   SpO2 95%   Physical Exam  Constitutional: She is oriented to person, place, and time. She appears well-developed.  HENT:  Head: Normocephalic.  Eyes: Conjunctivae and EOM are normal. No scleral icterus.  Neck: Neck supple. No thyromegaly present.  Cardiovascular: Normal rate and regular rhythm.  Exam reveals no gallop and no  friction rub.   No murmur heard. Pulmonary/Chest: No stridor. She has no wheezes. She has no rales. She exhibits no tenderness.  Abdominal: She exhibits no distension. There is no tenderness. There is no rebound.  Musculoskeletal: Normal range of motion. She exhibits no edema.  Lymphadenopathy:    She has no cervical adenopathy.  Neurological: She is oriented to person, place, and time. She exhibits normal muscle tone. Coordination normal.  Skin: No rash noted. No erythema.  Psychiatric: She has a normal mood and affect. Her behavior is normal.     ED Treatments / Results  Labs (all labs ordered are listed, but only abnormal results are displayed) Labs Reviewed  CBC WITH DIFFERENTIAL/PLATELET - Abnormal; Notable for the following:  Result Value   WBC 3.5 (*)    All other components within normal limits  COMPREHENSIVE METABOLIC PANEL - Abnormal; Notable for the following:    CO2 21 (*)    Glucose, Bld 121 (*)    Total Bilirubin 0.2 (*)    All other components within normal limits  VALPROIC ACID LEVEL - Abnormal; Notable for the following:    Valproic Acid Lvl <10 (*)    All other components within normal limits    EKG  EKG Interpretation None       Radiology No results found.  Procedures Procedures (including critical care time)  Medications Ordered in ED Medications  LORazepam (ATIVAN) injection 1 mg (1 mg Intravenous Given 07/17/17 1556)  valproate (DEPACON) 500 mg in dextrose 5 % 50 mL IVPB (500 mg Intravenous New Bag/Given 07/17/17 1808)     Initial Impression / Assessment and Plan / ED Course  I have reviewed the triage vital signs and the nursing notes.  Pertinent labs & imaging results that were available during my care of the patient were reviewed by me and considered in my medical decision making (see chart for details).    Patient was seizures and she is out of her Xanax. Labs unremarkable here patient did well in the emergency department without  seizures. She is to contact her doctor tomorrow  Final Clinical Impressions(s) / ED Diagnoses   Final diagnoses:  Pseudoseizure    New Prescriptions New Prescriptions   No medications on file     Milton Ferguson, MD 07/17/17 [phone]

## 2017-07-17 NOTE — ED Triage Notes (Signed)
EMS called out because pt was riding her bike and upon arrival she was having convulsions saying "I'm having a seizure." Pt had a near syncopal episode on scene after standing up after convulsions. Alert and oriented now. Tachycardic.

## 2017-07-17 NOTE — Discharge Instructions (Signed)
Call your doctor tomorrow and talk to him about your Xanax prescription

## 2017-08-13 ENCOUNTER — Encounter (HOSPITAL_COMMUNITY): Payer: Self-pay

## 2017-08-13 ENCOUNTER — Encounter (HOSPITAL_COMMUNITY): Payer: Self-pay | Admitting: Emergency Medicine

## 2017-08-13 ENCOUNTER — Emergency Department (HOSPITAL_COMMUNITY)
Admission: EM | Admit: 2017-08-13 | Discharge: 2017-08-13 | Disposition: A | Discharge status: Other Acute Inpatient Hospital | Payer: 59 | Attending: Emergency Medicine | Admitting: Emergency Medicine

## 2017-08-13 ENCOUNTER — Inpatient Hospital Stay (HOSPITAL_COMMUNITY)
Admission: AD | Admit: 2017-08-13 | Discharge: 2017-08-18 | DRG: 885 | Disposition: A | Discharge status: Home / Self Care | Payer: 59 | Source: Intra-hospital | Attending: Psychiatry | Admitting: Psychiatry

## 2017-08-13 DIAGNOSIS — M545 Low back pain: Secondary | ICD-10-CM | POA: Diagnosis present

## 2017-08-13 DIAGNOSIS — K219 Gastro-esophageal reflux disease without esophagitis: Secondary | ICD-10-CM | POA: Diagnosis present

## 2017-08-13 DIAGNOSIS — Z818 Family history of other mental and behavioral disorders: Secondary | ICD-10-CM | POA: Diagnosis not present

## 2017-08-13 DIAGNOSIS — J45909 Unspecified asthma, uncomplicated: Secondary | ICD-10-CM | POA: Diagnosis not present

## 2017-08-13 DIAGNOSIS — Z8249 Family history of ischemic heart disease and other diseases of the circulatory system: Secondary | ICD-10-CM

## 2017-08-13 DIAGNOSIS — Z7982 Long term (current) use of aspirin: Secondary | ICD-10-CM | POA: Insufficient documentation

## 2017-08-13 DIAGNOSIS — Z915 Personal history of self-harm: Secondary | ICD-10-CM | POA: Diagnosis not present

## 2017-08-13 DIAGNOSIS — Z885 Allergy status to narcotic agent status: Secondary | ICD-10-CM | POA: Diagnosis not present

## 2017-08-13 DIAGNOSIS — Z881 Allergy status to other antibiotic agents status: Secondary | ICD-10-CM | POA: Diagnosis not present

## 2017-08-13 DIAGNOSIS — F988 Other specified behavioral and emotional disorders with onset usually occurring in childhood and adolescence: Secondary | ICD-10-CM | POA: Diagnosis present

## 2017-08-13 DIAGNOSIS — Z6379 Other stressful life events affecting family and household: Secondary | ICD-10-CM | POA: Diagnosis not present

## 2017-08-13 DIAGNOSIS — F1910 Other psychoactive substance abuse, uncomplicated: Secondary | ICD-10-CM | POA: Diagnosis not present

## 2017-08-13 DIAGNOSIS — Z635 Disruption of family by separation and divorce: Secondary | ICD-10-CM | POA: Diagnosis not present

## 2017-08-13 DIAGNOSIS — E221 Hyperprolactinemia: Secondary | ICD-10-CM | POA: Diagnosis present

## 2017-08-13 DIAGNOSIS — F419 Anxiety disorder, unspecified: Secondary | ICD-10-CM | POA: Diagnosis not present

## 2017-08-13 DIAGNOSIS — F1320 Sedative, hypnotic or anxiolytic dependence, uncomplicated: Secondary | ICD-10-CM | POA: Diagnosis not present

## 2017-08-13 DIAGNOSIS — Z90710 Acquired absence of both cervix and uterus: Secondary | ICD-10-CM

## 2017-08-13 DIAGNOSIS — R443 Hallucinations, unspecified: Secondary | ICD-10-CM | POA: Diagnosis not present

## 2017-08-13 DIAGNOSIS — Z560 Unemployment, unspecified: Secondary | ICD-10-CM | POA: Diagnosis not present

## 2017-08-13 DIAGNOSIS — F515 Nightmare disorder: Secondary | ICD-10-CM | POA: Diagnosis not present

## 2017-08-13 DIAGNOSIS — Z630 Problems in relationship with spouse or partner: Secondary | ICD-10-CM | POA: Diagnosis not present

## 2017-08-13 DIAGNOSIS — F251 Schizoaffective disorder, depressive type: Principal | ICD-10-CM | POA: Diagnosis present

## 2017-08-13 DIAGNOSIS — Z888 Allergy status to other drugs, medicaments and biological substances status: Secondary | ICD-10-CM

## 2017-08-13 DIAGNOSIS — M19042 Primary osteoarthritis, left hand: Secondary | ICD-10-CM | POA: Diagnosis present

## 2017-08-13 DIAGNOSIS — G4700 Insomnia, unspecified: Secondary | ICD-10-CM | POA: Diagnosis present

## 2017-08-13 DIAGNOSIS — F4310 Post-traumatic stress disorder, unspecified: Secondary | ICD-10-CM | POA: Diagnosis present

## 2017-08-13 DIAGNOSIS — G8929 Other chronic pain: Secondary | ICD-10-CM | POA: Diagnosis present

## 2017-08-13 DIAGNOSIS — Z87891 Personal history of nicotine dependence: Secondary | ICD-10-CM | POA: Insufficient documentation

## 2017-08-13 DIAGNOSIS — Z036 Encounter for observation for suspected toxic effect from ingested substance ruled out: Secondary | ICD-10-CM | POA: Diagnosis not present

## 2017-08-13 DIAGNOSIS — Z79899 Other long term (current) drug therapy: Secondary | ICD-10-CM | POA: Insufficient documentation

## 2017-08-13 DIAGNOSIS — F329 Major depressive disorder, single episode, unspecified: Secondary | ICD-10-CM | POA: Diagnosis present

## 2017-08-13 DIAGNOSIS — F17210 Nicotine dependence, cigarettes, uncomplicated: Secondary | ICD-10-CM | POA: Diagnosis not present

## 2017-08-13 LAB — CBC WITH DIFFERENTIAL/PLATELET
BASOS ABS: 0 10*3/uL (ref 0.0–0.1)
BASOS PCT: 0 %
Eosinophils Absolute: 0.1 10*3/uL (ref 0.0–0.7)
Eosinophils Relative: 2 %
HEMATOCRIT: 39 % (ref 36.0–46.0)
HEMOGLOBIN: 13.4 g/dL (ref 12.0–15.0)
Lymphocytes Relative: 42 %
Lymphs Abs: 2.3 10*3/uL (ref 0.7–4.0)
MCH: 30.9 pg (ref 26.0–34.0)
MCHC: 34.4 g/dL (ref 30.0–36.0)
MCV: 89.9 fL (ref 78.0–100.0)
MONOS PCT: 8 %
Monocytes Absolute: 0.5 10*3/uL (ref 0.1–1.0)
NEUTROS ABS: 2.6 10*3/uL (ref 1.7–7.7)
NEUTROS PCT: 48 %
Platelets: 239 10*3/uL (ref 150–400)
RBC: 4.34 MIL/uL (ref 3.87–5.11)
RDW: 12.1 % (ref 11.5–15.5)
WBC: 5.4 10*3/uL (ref 4.0–10.5)

## 2017-08-13 LAB — COMPREHENSIVE METABOLIC PANEL
ALT: 20 U/L (ref 14–54)
ANION GAP: 10 (ref 5–15)
AST: 27 U/L (ref 15–41)
Albumin: 4.1 g/dL (ref 3.5–5.0)
Alkaline Phosphatase: 53 U/L (ref 38–126)
BILIRUBIN TOTAL: 0.6 mg/dL (ref 0.3–1.2)
BUN: 6 mg/dL (ref 6–20)
CO2: 25 mmol/L (ref 22–32)
Calcium: 10.1 mg/dL (ref 8.9–10.3)
Chloride: 104 mmol/L (ref 101–111)
Creatinine, Ser: 0.98 mg/dL (ref 0.44–1.00)
GFR calc Af Amer: >60 mL/min (ref >60)
Glucose, Bld: 100 mg/dL — ABNORMAL HIGH (ref 65–99)
POTASSIUM: 3.6 mmol/L (ref 3.5–5.1)
Sodium: 139 mmol/L (ref 135–145)
TOTAL PROTEIN: 7.1 g/dL (ref 6.5–8.1)

## 2017-08-13 LAB — RAPID URINE DRUG SCREEN, HOSP PERFORMED
Amphetamines: NOT DETECTED
Barbiturates: NOT DETECTED
Benzodiazepines: NOT DETECTED
Cocaine: NOT DETECTED
OPIATES: NOT DETECTED
TETRAHYDROCANNABINOL: NOT DETECTED

## 2017-08-13 LAB — ETHANOL: Alcohol, Ethyl (B): <5 mg/dL (ref <5)

## 2017-08-13 LAB — ACETAMINOPHEN LEVEL: Acetaminophen (Tylenol), Serum: <10 ug/mL — ABNORMAL LOW (ref 10–30)

## 2017-08-13 LAB — HCG, QUANTITATIVE, PREGNANCY: hCG, Beta Chain, Quant, S: 3 m[IU]/mL (ref <5)

## 2017-08-13 LAB — SALICYLATE LEVEL

## 2017-08-13 LAB — VALPROIC ACID LEVEL

## 2017-08-13 MED ORDER — VITAMIN B-1 100 MG PO TABS: 100.0000 mg | ORAL_TABLET | Freq: Every day | ORAL | Status: DC

## 2017-08-13 MED ORDER — LORAZEPAM 2 MG/ML IJ SOLN
0.0000 mg | Freq: Four times a day (QID) | INTRAMUSCULAR | Status: DC
  Administered 2017-08-13: 2 mg via INTRAVENOUS
  Filled 2017-08-13: qty 1

## 2017-08-13 MED ORDER — ASPIRIN EC 81 MG PO TBEC
81.0000 mg | DELAYED_RELEASE_TABLET | Freq: Every day | ORAL | Status: DC
  Administered 2017-08-13: 81 mg via ORAL
  Filled 2017-08-13: qty 1

## 2017-08-13 MED ORDER — LORAZEPAM 1 MG PO TABS: 0.0000 mg | ORAL_TABLET | Freq: Two times a day (BID) | ORAL | Status: DC

## 2017-08-13 MED ORDER — LORAZEPAM 2 MG/ML IJ SOLN: 0.0000 mg | Freq: Two times a day (BID) | INTRAMUSCULAR | Status: DC

## 2017-08-13 MED ORDER — MAGNESIUM HYDROXIDE 400 MG/5ML PO SUSP: 30.0000 mL | Freq: Every day | ORAL | Status: DC | PRN

## 2017-08-13 MED ORDER — HYDROXYZINE HCL 25 MG PO TABS
25.0000 mg | ORAL_TABLET | Freq: Three times a day (TID) | ORAL | Status: DC | PRN
  Administered 2017-08-13 – 2017-08-14 (×2): 25 mg via ORAL
  Filled 2017-08-13 (×2): qty 1

## 2017-08-13 MED ORDER — CLONAZEPAM 0.5 MG PO TABS: 0.5000 mg | ORAL_TABLET | Freq: Two times a day (BID) | ORAL | Status: DC | PRN

## 2017-08-13 MED ORDER — DIVALPROEX SODIUM ER 500 MG PO TB24: 500.0000 mg | ORAL_TABLET | Freq: Every day | ORAL | Status: DC

## 2017-08-13 MED ORDER — LORAZEPAM 1 MG PO TABS: 0.0000 mg | ORAL_TABLET | Freq: Four times a day (QID) | ORAL | Status: DC

## 2017-08-13 MED ORDER — TRAZODONE HCL 50 MG PO TABS
50.0000 mg | ORAL_TABLET | Freq: Every evening | ORAL | Status: DC | PRN
  Administered 2017-08-13: 50 mg via ORAL
  Filled 2017-08-13: qty 1

## 2017-08-13 MED ORDER — IBUPROFEN 400 MG PO TABS
400.0000 mg | ORAL_TABLET | Freq: Four times a day (QID) | ORAL | Status: DC | PRN
  Administered 2017-08-14 (×2): 400 mg via ORAL
  Filled 2017-08-13 (×2): qty 1

## 2017-08-13 MED ORDER — NICOTINE 21 MG/24HR TD PT24
21.0000 mg | MEDICATED_PATCH | Freq: Every day | TRANSDERMAL | Status: DC
  Administered 2017-08-14 – 2017-08-18 (×5): 21 mg via TRANSDERMAL
  Filled 2017-08-13 (×8): qty 1

## 2017-08-13 MED ORDER — TRAZODONE HCL 50 MG PO TABS
50.0000 mg | ORAL_TABLET | Freq: Every evening | ORAL | Status: DC | PRN
  Administered 2017-08-13: 50 mg via ORAL
  Filled 2017-08-13 (×3): qty 1

## 2017-08-13 MED ORDER — BENZTROPINE MESYLATE 1 MG PO TABS
1.0000 mg | ORAL_TABLET | Freq: Three times a day (TID) | ORAL | Status: DC
  Administered 2017-08-13: 1 mg via ORAL
  Filled 2017-08-13: qty 1

## 2017-08-13 MED ORDER — ALUM & MAG HYDROXIDE-SIMETH 200-200-20 MG/5ML PO SUSP: 30.0000 mL | ORAL | Status: DC | PRN

## 2017-08-13 MED ORDER — ALPRAZOLAM 0.5 MG PO TABS
1.0000 mg | ORAL_TABLET | Freq: Three times a day (TID) | ORAL | Status: DC
  Administered 2017-08-13: 1 mg via ORAL
  Filled 2017-08-13: qty 2

## 2017-08-13 MED ORDER — ACETAMINOPHEN 325 MG PO TABS
650.0000 mg | ORAL_TABLET | Freq: Four times a day (QID) | ORAL | Status: DC | PRN
  Administered 2017-08-13 – 2017-08-18 (×5): 650 mg via ORAL
  Filled 2017-08-13 (×5): qty 2

## 2017-08-13 MED ORDER — THIAMINE HCL 100 MG/ML IJ SOLN
100.0000 mg | Freq: Every day | INTRAMUSCULAR | Status: DC
  Administered 2017-08-13: 100 mg via INTRAVENOUS
  Filled 2017-08-13: qty 2

## 2017-08-13 NOTE — ED Notes (Signed)
Pt spoke w/her daughter, [name], and advised of acceptance to [facility].

## 2017-08-13 NOTE — ED Notes (Signed)
Pt ambulated out of room. When asked by staff where she's going, states "I don't know". RN showed pt where bathroom is located - pt states there's no toilet paper. Showed pt toilet paper is there. Pt repeats what staff says. Pt then emptied bladder and returned to room as asked. Pt remains cooperative, calm at this time.

## 2017-08-13 NOTE — ED Notes (Signed)
Shaneka, Lab Tech, advised adding on Valproic Acid Level.

## 2017-08-13 NOTE — Tx Team (Signed)
Initial Treatment Plan 08/13/2017 8:41 PM Deborah Pena [id number]:[id number]    PATIENT STRESSORS: Financial difficulties Health problems Marital or family conflict Occupational concerns   PATIENT STRENGTHS: Capable of independent living Communication skills General fund of knowledge   PATIENT IDENTIFIED PROBLEMS: Depression  Suicidal ideation  "Learn the coping skills, observe them and absorb them"  "I am not sure what to do with all this stress"               DISCHARGE CRITERIA:  Improved stabilization in mood, thinking, and/or behavior Verbal commitment to aftercare and medication compliance  PRELIMINARY DISCHARGE PLAN: Outpatient therapy Medication management  PATIENT/FAMILY INVOLVEMENT: This treatment plan has been presented to and reviewed with the patient, Deborah Pena.  The patient and family have been given the opportunity to ask questions and make suggestions.  Windell Moment, RN 08/13/2017, 8:41 PM

## 2017-08-13 NOTE — ED Notes (Signed)
House sitter at bedside.

## 2017-08-13 NOTE — ED Notes (Signed)
Pt attempting to get out of bed. Pt yelling Nurse at bedside. Pt back in bed.

## 2017-08-13 NOTE — ED Notes (Signed)
Patient was given a Cup of Sprite and [employer] and Peanut Butter.

## 2017-08-13 NOTE — ED Provider Notes (Signed)
  Face-to-face evaluation   History: Reported overdose, meds taken unclear. Sees psychiatry regularly.   Physical exam: Confused, hallucinating- visual. Distracted, Slurred speech. Cooperative.   Medical screening examination/treatment/procedure(s) were conducted as a shared visit with non-physician practitioner(s) and myself.  I personally evaluated the patient during the encounter   Daleen Bo, MD 08/14/17 1055

## 2017-08-13 NOTE — ED Notes (Signed)
Pt noted to be restless - will sleep for approx 5-10 min at a time then up - ambulating to bathroom or telling staff she needs to leave d/t she needs to go check on something. Pt returns to room when asked by staff.

## 2017-08-13 NOTE — ED Notes (Signed)
Regular Diet was Ordered for Dinner. 

## 2017-08-13 NOTE — ED Notes (Signed)
Pt attempting to place phone call - no answer.

## 2017-08-13 NOTE — ED Notes (Signed)
Pt on phone attempting to reach her daughter.

## 2017-08-13 NOTE — ED Notes (Signed)
Left message for pt's daughter, [name], so may notify of acceptance to [facility].

## 2017-08-13 NOTE — ED Notes (Signed)
Attempted to call report to [facility] - requested to return call.

## 2017-08-13 NOTE — Progress Notes (Signed)
Patient accepted to [facility], per [facility]. Attending provider is Dr. Shea Evans Accepted to room 503 bed 2.  ETA 17:30.  [facility] RN [facility] Bowl was informed.  Verlon Setting, MSW, LCSWA Clinical social worker in [place], [place] Office 08/13/2017 4:50 PM

## 2017-08-13 NOTE — BH Assessment (Addendum)
Tele Assessment Note   Patient Name: Deborah Pena MRN: [id number] Referring Physician: Daleen Bo, MD Location of Patient: [name] Location of Provider: [place]  Deborah Pena is a 51 y.o. female, presenting voluntarily due to a suspected OD on her rx meds. Per triage notes, " EMS found zolpidem filled on 8/24, #30, with 2 left.  Also xanax #90 filled on 8/7 that is empty." Pt is a poor historian and appears to be experiencing some psychosis as evidenced by her rambling, unrelated speech and flight of ideas during the assessment. Pt said she was at the hospital b/c she has been having "alot of problems with my eyes". When clinician asked about the suspected OD, pt could not explain what happened to the pills. Pt denied SI and HI. She admitted that she has a hx of command hallucinations telling her to hurt others, but says she hasn't experienced them in @ 2 months. Towards the end of the assessment, pt became very tearful, indicating that "I just want to be a good mother to Mallory".   Diagnosis: Schizoaffective d/o, depressive type  Past Medical History:  Past Medical History:  Diagnosis Date  . Anemia   . Anginal pain ([place])   . Anxiety   . Arthritis    "left hand" (04/26/2016)  . Asthma   . Chronic lower back pain   . Diverticulitis of colon   . GERD (gastroesophageal reflux disease)   . Headache    'once or twice/week" (04/26/2016)  . Migraine    "twice/month" (04/26/2016)  . Stomach ulcer     Past Surgical History:  Procedure Laterality Date  . ABDOMINAL HYSTERECTOMY  2013   preformed at [facility] by Dr Jacqualyn Posey in April  . BREAST SURGERY Bilateral ~ 2000   "dual lateral duct removal"   . BUNIONECTOMY Right 2013  . CESAREAN SECTION  1999  . OOPHORECTOMY Right 2013  . TONSILLECTOMY      Family History:  Family History  Problem Relation Age of Onset  . Heart failure Father   . Diabetes Other   . Breast cancer Other   . Diverticulitis  Other   . Mental illness Paternal Grandmother   . Mental illness Paternal Grandfather   . Schizophrenia Paternal Aunt   . Suicidality Maternal Aunt     Social History:  reports that she has quit smoking. Her smoking use included Cigarettes. She has a 60.00 pack-year smoking history. She has quit using smokeless tobacco. Her smokeless tobacco use included Chew. She reports that she does not drink alcohol or use drugs.  Additional Social History:  Alcohol / Drug Use Pain Medications: See PTA meds  Prescriptions: See PTA meds  Over the Counter: See PTA meds  History of alcohol / drug use?: No history of alcohol / drug abuse  CIWA: CIWA-Ar BP: 131/84 Pulse Rate: 84 Nausea and Vomiting: mild nausea with no vomiting Tactile Disturbances: none Tremor: no tremor Auditory Disturbances: very mild harshness or ability to frighten Paroxysmal Sweats: barely perceptible sweating, palms moist Visual Disturbances: mild sensitivity Anxiety: moderately anxious, or guarded, so anxiety is inferred Headache, Fullness in Head: none present Agitation: two Orientation and Clouding of Sensorium: disoriented for data by no more than 2 calendar days CIWA-Ar Total: 13 COWS:    PATIENT STRENGTHS: (choose at least two) Average or above average intelligence Capable of independent living Supportive family/friends  Allergies:  Allergies  Allergen Reactions  . Buprenorphine Hcl Other (See Comments)    "Becomes psychotic"--per  notes from another healthcare network  . Ciprofloxacin Itching and Other (See Comments)    Sore throat, hoarseness  . Codeine Itching and Other (See Comments)    Itches from inside out. Per pt, she can tolerate now  . Propranolol Other (See Comments)    Syncope    Home Medications:  (Not in a hospital admission)  OB/GYN Status:  No LMP recorded. Patient has had a hysterectomy.  General Assessment Data Location of Assessment: [facility] ED TTS Assessment: In system Is this a Tele  or Face-to-Face Assessment?: Tele Assessment Is this an Initial Assessment or a Re-assessment for this encounter?: Initial Assessment Marital status: Separated Is patient pregnant?: No Pregnancy Status: No Living Arrangements: Children Can pt return to current living arrangement?: Yes Admission Status: Voluntary Is patient capable of signing voluntary admission?: Yes Referral Source: Self/Family/Friend Insurance type: [place] Living Arrangements: Children Name of Psychiatrist: [facility] Name of Therapist: [facility]  Education Status Is patient currently in school?: No  Risk to self with the past 6 months Suicidal Ideation: No-Not Currently/Within Last 6 Months Has patient been a risk to self within the past 6 months prior to admission? : Yes Suicidal Intent: No Has patient had any suicidal intent within the past 6 months prior to admission? : No Is patient at risk for suicide?: Yes Suicidal Plan?: No-Not Currently/Within Last 6 Months Has patient had any suicidal plan within the past 6 months prior to admission? : No Access to Means: Yes Specify Access to Suicidal Means: rx meds Previous Attempts/Gestures: Yes Triggers for Past Attempts: Unknown Intentional Self Injurious Behavior: None Family Suicide History: Unknown Recent stressful life event(s): Divorce Persecutory voices/beliefs?: Yes Depression: Yes Depression Symptoms: Tearfulness, Fatigue, Guilt, Feeling worthless/self pity Substance abuse history and/or treatment for substance abuse?: No Suicide prevention information given to non-admitted patients: Not applicable  Risk to Others within the past 6 months Homicidal Ideation: No Does patient have any lifetime risk of violence toward others beyond the six months prior to admission? : No Thoughts of Harm to Others: No Current Homicidal Intent: No Current Homicidal Plan: No Access to Homicidal Means: No History of harm to  others?: No Assessment of Violence: None Noted Does patient have access to weapons?: No Criminal Charges Pending?: No Does patient have a court date: No Is patient on probation?: No  Psychosis Hallucinations: Auditory, With command Delusions: None noted  Mental Status Report Appearance/Hygiene: Unremarkable Eye Contact: Good Motor Activity: Unremarkable Speech: Unremarkable Level of Consciousness: Quiet/awake Mood: Depressed, Ashamed/humiliated, Pleasant Affect: Appropriate to circumstance Anxiety Level: Moderate Thought Processes: Flight of Ideas Judgement: Impaired Orientation: Person, Place, Time, Situation Obsessive Compulsive Thoughts/Behaviors: Unable to Assess  Cognitive Functioning Concentration: Normal Memory: Unable to Assess IQ: Average Insight: Poor Impulse Control: Unable to Assess Appetite: Poor Weight Loss: 15 Sleep: Unable to Assess Total Hours of Sleep: 10 Vegetative Symptoms: Staying in bed  ADLScreening [facility] Assessment Services) Patient's cognitive ability adequate to safely complete daily activities?: Yes Patient able to express need for assistance with ADLs?: Yes Independently performs ADLs?: Yes (appropriate for developmental age)  Prior Inpatient Therapy Prior Inpatient Therapy: Yes Prior Therapy Dates: 12/2016; 02/2017 Prior Therapy Facilty/Provider(s): [place]; OV Reason for Treatment: schizoaffective; MDD  Prior Outpatient Therapy Prior Outpatient Therapy: No Does patient have an ACCT team?: No Does patient have Intensive In-House Services?  : No Does patient have [facility] services? : No Does patient have P4CC services?: No  ADL Screening (condition at time of  admission) Patient's cognitive ability adequate to safely complete daily activities?: Yes Is the patient deaf or have difficulty hearing?: No Does the patient have difficulty seeing, even when wearing glasses/contacts?: No Does the patient have difficulty concentrating, remembering,  or making decisions?: No Patient able to express need for assistance with ADLs?: Yes Does the patient have difficulty dressing or bathing?: No Independently performs ADLs?: Yes (appropriate for developmental age) Does the patient have difficulty walking or climbing stairs?: No Weakness of Legs: None Weakness of Arms/Hands: None  Home Assistive Devices/Equipment Home Assistive Devices/Equipment: None  Therapy Consults (therapy consults require a physician order) PT Evaluation Needed: No OT Evalulation Needed: No SLP Evaluation Needed: No Abuse/Neglect Assessment (Assessment to be complete while patient is alone) Physical Abuse: Denies Verbal Abuse: Denies Sexual Abuse: Denies Exploitation of patient/patient's resources: Denies Self-Neglect: Denies Values / Beliefs Cultural Requests During Hospitalization: None Spiritual Requests During Hospitalization: None   Advance Directives (For Healthcare) Does Patient Have a Medical Advance Directive?: No Would patient like information on creating a medical advance directive?: No - Patient declined Nutrition Screen- [facility] Adult/[facility]/AP Patient's home diet: Regular Has the patient recently lost weight without trying?: Yes, 14-23 lbs. Has the patient been eating poorly because of a decreased appetite?: Yes Malnutrition Screening Tool Score: 3  Additional Information 1:1 In Past 12 Months?: No CIRT Risk: No Elopement Risk: No Does patient have medical clearance?: Yes     Disposition:  Disposition Initial Assessment Completed for this Encounter: Yes (consulted with Hughie Closs, NP) Disposition of Patient: Inpatient treatment program Type of inpatient treatment program: Adult  This service was provided via telemedicine using a 2-way, interactive audio and video technology.  Names of all persons participating in this telemedicine service and their role in this encounter. Name: Madlyn Frankel Role: house sitter    Rexene Edison 08/13/2017 11:17 AM

## 2017-08-13 NOTE — Progress Notes (Signed)
Adult Psychoeducational Group Note  Date:  08/13/2017 Time:  9:00 PM  Group Topic/Focus:  Wrap-Up Group:   The focus of this group is to help patients review their daily goal of treatment and discuss progress on daily workbooks.  Participation Level:  Active  Participation Quality:  Appropriate  Affect:  Appropriate  Cognitive:  Appropriate  Insight: Appropriate  Engagement in Group:  Engaged  Modes of Intervention:  Discussion  Additional Comments: The patient expressed that she recently arrived.  Nash Shearer 08/13/2017, 9:00 PM

## 2017-08-13 NOTE — Progress Notes (Signed)
Deborah Pena is a 51 year old female being admitted voluntarily to [facility] from [facility].  She came to the ED due to suspected OD on her prescription medication (Xanax, ambien).  She denied SI/HI and has history of command hallucinations.  She was unable to participate in the [facility] assessment due to flight of ideas and rambling speech.  She is diagnosed with Schizoaffective Disorder, depressed type. She currently denies any SI/HI or A/V halluciantions.  She does reprot that she has been having depression since she is divorcing her husband and has no income.  "I don't know what to do with my life, I don't even have job skills."  She was very fidgety during admission, grabbing at staff's paperwork and trying to put it in her bag.  She was easily redirected.  Oriented her to the unit.  Admission paperwork completed and signed.  Belongings searched and secured in locker # 34.  Skin assessment completed and no skin issues noted.  Q 15 minute checks initiated for safety.  We will monitor the progress towards her goals.

## 2017-08-13 NOTE — ED Notes (Signed)
Pt ambulatory to nurses' desk asking for an inhaler. States she is not on an inhaler at home. No shortness of breath noted. Pt able to speak in complete sentences. States she wants an inhaler d/t dust in the air. Pt returned to room as asked by staff.

## 2017-08-13 NOTE — ED Triage Notes (Signed)
Per EMS pt lives with daughter. Called out to home regarding "overdose". EMS found zolpidem filled on 8/24, #30, with 2 left.  Also xanax #90 filled on 8/7 that is empty.

## 2017-08-13 NOTE — ED Notes (Signed)
Regular Diet was ordered for Lunch. 

## 2017-08-13 NOTE — ED Triage Notes (Signed)
PT in hospital bed and has been observed talking to self. Pt  Said  The cat has to go out   you help me. Pt reminded she was in the hospital . Pt said" Oh yeah."

## 2017-08-13 NOTE — ED Provider Notes (Signed)
[facility] DEPT Provider Note   CSN: [id number] Arrival date & time: 08/13/17  8099     History   Chief Complaint Chief Complaint  Patient presents with  . Ingestion    HPI Deborah Pena is a 51 y.o. female who presents to the emergency department via EMS for chief complaint of overdose. EMS reports Ambien filled on 8/24, #30 with 2 left and Xanax #90 filled on 8/7 that is empty. The patient initially states that she "took 2 Ambien and 4 enemas' because she couldn't sleep and was having abdominal pain. Later, she states that she took 4 Ambien, no enemas, and drank 2 bottles of wine last night. She reports that she is currently overwhelmed with life. She states that she is unhappy with her daughter's boyfriend who currently lives in the guest bedroom. He states that she feels life is worth living, but becomes tearful and mumbles about "a league of angels". She later states that she made a card just for me and frantically looks through the bed around her. When she is unable to find it, she declares that she will just watch [address] instead.   Level V caveat secondary to altered mental status.   The history is provided by the patient and the EMS personnel. No language interpreter was used.    Past Medical History:  Diagnosis Date  . Anemia   . Anginal pain ([place])   . Anxiety   . Arthritis    "left hand" (04/26/2016)  . Asthma   . Chronic lower back pain   . Diverticulitis of colon   . GERD (gastroesophageal reflux disease)   . Headache    'once or twice/week" (04/26/2016)  . Migraine    "twice/month" (04/26/2016)  . Stomach ulcer     Patient Active Problem List   Diagnosis Date Noted  . Alcohol abuse 03/13/2017  . Drug overdose   . Schizoaffective disorder, depressive type ([place]) 01/16/2017  . PTSD (post-traumatic stress disorder) 01/16/2017  . Panic disorder 01/16/2017  . Acute diverticulitis 04/26/2016  . Diverticulitis of large intestine with abscess without bleeding  04/26/2016  . Intractable migraine 07/27/2014    Past Surgical History:  Procedure Laterality Date  . ABDOMINAL HYSTERECTOMY  2013   preformed at [facility] by Dr Jacqualyn Posey in April  . BREAST SURGERY Bilateral ~ 2000   "dual lateral duct removal"   . BUNIONECTOMY Right 2013  . CESAREAN SECTION  1999  . OOPHORECTOMY Right 2013  . TONSILLECTOMY      OB History    No data available       Home Medications    Prior to Admission medications   Medication Sig Start Date End Date Taking? Authorizing Provider  acetaminophen (TYLENOL) 325 MG tablet Take 2 tablets (650 mg total) by mouth every 6 (six) hours as needed. For fever/pain 01/21/17  Yes Nwoko, Herbert Pun I, NP  alprazolam Duanne Moron) 2 MG tablet Take 1 mg by mouth 3 (three) times daily.    Yes [provider]  aspirin EC 81 MG tablet Take 81 mg by mouth daily.   Yes [provider]  benztropine (COGENTIN) 1 MG tablet Take 1 mg by mouth 3 (three) times daily. 07/22/17  Yes [provider]  ibuprofen (ADVIL) 200 MG tablet Take 400 mg by mouth every 6 (six) hours as needed (for headaches).   Yes [provider]  Multiple Vitamin (MULTIVITAMIN WITH MINERALS) TABS tablet Take 1 tablet by mouth daily.   Yes [provider]  clonazePAM (KLONOPIN) 0.5 MG tablet Take 1 tablet (0.5 mg total) by mouth 2 (two) times daily as needed (severe anxiety). 01/21/17   Lindell Spar I, NP  divalproex (DEPAKOTE ER) 500 MG 24 hr tablet Take 1 tablet (500 mg total) by mouth at bedtime. For mood stabilization 01/21/17   Lindell Spar I, NP  hydrOXYzine (ATARAX/VISTARIL) 25 MG tablet Take 1 tablet (25 mg total) by mouth 3 (three) times daily as needed for anxiety. Patient not taking: Reported on 03/04/2017 01/21/17   Lindell Spar I, NP  lurasidone (LATUDA) 20 MG TABS tablet Take 1 tablet (20 mg total) by mouth at bedtime. For mood control Patient not taking: Reported on 03/04/2017 01/21/17   Lindell Spar I, NP  nicotine (NICODERM CQ -  DOSED IN MG/24 HOURS) 21 mg/24hr patch Place 1 patch (21 mg total) onto the skin daily at 6 (six) AM. For smoking cessation Patient not taking: Reported on 03/04/2017 01/22/17   Lindell Spar I, NP  sertraline (ZOLOFT) 100 MG tablet Take 1 tablet (100 mg total) by mouth daily. For depression Patient not taking: Reported on 07/17/2017 01/22/17   Lindell Spar I, NP  traZODone (DESYREL) 50 MG tablet Take 1 tablet (50 mg total) by mouth at bedtime as needed for sleep. Patient not taking: Reported on 07/17/2017 01/21/17   Lindell Spar I, NP    Family History Family History  Problem Relation Age of Onset  . Heart failure Father   . Diabetes Other   . Breast cancer Other   . Diverticulitis Other   . Mental illness Paternal Grandmother   . Mental illness Paternal Grandfather   . Schizophrenia Paternal Aunt   . Suicidality Maternal Aunt     Social History Social History  Substance Use Topics  . Smoking status: Former Smoker    Packs/day: 2.00    Years: 30.00    Types: Cigarettes  . Smokeless tobacco: Former Systems developer    Types: Chew     Comment: "quit smoking cigarettes in ~ 2011; chewed when I was little"  . Alcohol use No     Allergies   Buprenorphine hcl; Ciprofloxacin; Codeine; and Propranolol   Review of Systems Review of Systems  Unable to perform ROS: Mental status change  Gastrointestinal: Positive for abdominal pain.  Psychiatric/Behavioral: Negative for self-injury.   Physical Exam Updated Vital Signs BP 140/87   Pulse 96   Temp 98 F (36.7 C) (Oral)   Resp 20   SpO2 100%   Physical Exam  Constitutional: No distress.  HENT:  Head: Normocephalic.  Eyes: Conjunctivae are normal.  Neck: Neck supple.  Cardiovascular: Normal rate, regular rhythm, normal heart sounds and intact distal pulses.  Exam reveals no gallop and no friction rub.   No murmur heard. Pulmonary/Chest: Effort normal and breath sounds normal. No respiratory distress. She has no wheezes. She has no rales.    Abdominal: Soft. Bowel sounds are normal. She exhibits no distension. There is no tenderness.  Neurological: She is alert. GCS eye subscore is 4. GCS verbal subscore is 5. GCS motor subscore is 6.  Follows simple commands. Words are slurred. Ambulatory, but slightly staggering gait. Able to move the upper and lower extremities independently.   Skin: Skin is warm. No rash noted. She is not diaphoretic.  Psychiatric: Her mood appears anxious. Her affect is labile and inappropriate. Her speech is tangential and slurred. She is slowed. She expresses inappropriate judgment. She is attentive.  Nursing note and vitals reviewed.  ED Treatments / Results  Labs (all labs ordered are listed, but only abnormal results are displayed) Labs Reviewed  COMPREHENSIVE METABOLIC PANEL - Abnormal; Notable for the following:       Result Value   Glucose, Bld 100 (*)    All other components within normal limits  ACETAMINOPHEN LEVEL - Abnormal; Notable for the following:    Acetaminophen (Tylenol), Serum <10 (*)    All other components within normal limits  VALPROIC ACID LEVEL - Abnormal; Notable for the following:    Valproic Acid Lvl <10 (*)    All other components within normal limits  SALICYLATE LEVEL  ETHANOL  RAPID URINE DRUG SCREEN, HOSP PERFORMED  CBC WITH DIFFERENTIAL/PLATELET  HCG, QUANTITATIVE, PREGNANCY  CBG MONITORING, ED    EKG  EKG Interpretation None       Radiology No results found.  Procedures Procedures (including critical care time)  Medications Ordered in ED Medications  LORazepam (ATIVAN) injection 0-4 mg (0 mg Intravenous Not Given 08/13/17 1559)    Or  LORazepam (ATIVAN) tablet 0-4 mg ( Oral See Alternative 08/13/17 1559)  LORazepam (ATIVAN) injection 0-4 mg (not administered)    Or  LORazepam (ATIVAN) tablet 0-4 mg (not administered)  thiamine (VITAMIN B-1) tablet 100 mg ( Oral See Alternative 08/13/17 1144)    Or  thiamine (B-1) injection 100 mg (100 mg  Intravenous Given 08/13/17 1144)  ALPRAZolam (XANAX) tablet 1 mg (not administered)  benztropine (COGENTIN) tablet 1 mg (not administered)  aspirin EC tablet 81 mg (not administered)  clonazePAM (KLONOPIN) tablet 0.5 mg (not administered)  divalproex (DEPAKOTE ER) 24 hr tablet 500 mg (not administered)     Initial Impression / Assessment and Plan / ED Course  I have reviewed the triage vital signs and the nursing notes.  Pertinent labs & imaging results that were available during my care of the patient were reviewed by me and considered in my medical decision making (see chart for details).     51 year old female with a history of alcohol abuse, schizoaffective disorder, PTSD, panic disorder, and drug overdose who presents to the emergency department with a chief complaint of suspected drug overdose. The patient was seen and evaluated by Dr. Eulis Foster, attending physician. CBC and CMP are unremarkable. UDS is negative. Ethanol, Valproic acid, acetaminophen, and salicylate levels are not elevated. At this time, the patient is medically cleared. Consult to TTS placed and inpatient treatment was recommended for schizoaffective disorder, depressive type. Home med orders placed. The patient appears reasonably stabilized for admission considering the current resources, flow, and capabilities available in the ED at this time, and I doubt any other [facility] requiring further screening and/or treatment in the ED prior to admission.  Final Clinical Impressions(s) / ED Diagnoses   Final diagnoses:  Schizoaffective disorder, depressive type [facility])    New Prescriptions New Prescriptions   No medications on file     Joanne Gavel, PA-C 08/13/17 1635    Daleen Bo, MD 08/14/17 1055

## 2017-08-14 ENCOUNTER — Encounter (HOSPITAL_COMMUNITY): Payer: Self-pay | Admitting: Psychiatry

## 2017-08-14 DIAGNOSIS — G4700 Insomnia, unspecified: Secondary | ICD-10-CM

## 2017-08-14 DIAGNOSIS — Z818 Family history of other mental and behavioral disorders: Secondary | ICD-10-CM

## 2017-08-14 DIAGNOSIS — Z560 Unemployment, unspecified: Secondary | ICD-10-CM

## 2017-08-14 DIAGNOSIS — F251 Schizoaffective disorder, depressive type: Principal | ICD-10-CM

## 2017-08-14 DIAGNOSIS — F1320 Sedative, hypnotic or anxiolytic dependence, uncomplicated: Secondary | ICD-10-CM | POA: Clinically undetermined

## 2017-08-14 DIAGNOSIS — Z635 Disruption of family by separation and divorce: Secondary | ICD-10-CM

## 2017-08-14 DIAGNOSIS — F988 Other specified behavioral and emotional disorders with onset usually occurring in childhood and adolescence: Secondary | ICD-10-CM | POA: Diagnosis present

## 2017-08-14 DIAGNOSIS — F419 Anxiety disorder, unspecified: Secondary | ICD-10-CM

## 2017-08-14 DIAGNOSIS — R443 Hallucinations, unspecified: Secondary | ICD-10-CM

## 2017-08-14 DIAGNOSIS — F17210 Nicotine dependence, cigarettes, uncomplicated: Secondary | ICD-10-CM

## 2017-08-14 MED ORDER — MIRTAZAPINE 7.5 MG PO TABS
7.5000 mg | ORAL_TABLET | Freq: Every day | ORAL | Status: DC
  Administered 2017-08-14 – 2017-08-17 (×3): 7.5 mg via ORAL
  Filled 2017-08-14 (×7): qty 1

## 2017-08-14 MED ORDER — LORAZEPAM 1 MG PO TABS
1.0000 mg | ORAL_TABLET | Freq: Every day | ORAL | Status: AC
  Administered 2017-08-17: 1 mg via ORAL
  Filled 2017-08-14: qty 1

## 2017-08-14 MED ORDER — HYDROXYZINE HCL 25 MG PO TABS
25.0000 mg | ORAL_TABLET | Freq: Four times a day (QID) | ORAL | Status: AC | PRN
  Administered 2017-08-15: 25 mg via ORAL
  Filled 2017-08-14: qty 1

## 2017-08-14 MED ORDER — LORAZEPAM 1 MG PO TABS
1.0000 mg | ORAL_TABLET | Freq: Four times a day (QID) | ORAL | Status: AC
  Administered 2017-08-14 (×3): 1 mg via ORAL
  Filled 2017-08-14 (×3): qty 1

## 2017-08-14 MED ORDER — ONDANSETRON 4 MG PO TBDP: 4.0000 mg | ORAL_TABLET | Freq: Four times a day (QID) | ORAL | Status: AC | PRN

## 2017-08-14 MED ORDER — THIAMINE HCL 100 MG/ML IJ SOLN
100.0000 mg | Freq: Once | INTRAMUSCULAR | Status: AC
  Administered 2017-08-14: 100 mg via INTRAMUSCULAR
  Filled 2017-08-14: qty 2

## 2017-08-14 MED ORDER — BENZTROPINE MESYLATE 0.5 MG PO TABS
0.5000 mg | ORAL_TABLET | Freq: Every day | ORAL | Status: DC
  Administered 2017-08-14 – 2017-08-18 (×5): 0.5 mg via ORAL
  Filled 2017-08-14 (×8): qty 1

## 2017-08-14 MED ORDER — ADULT MULTIVITAMIN W/MINERALS CH
1.0000 | ORAL_TABLET | Freq: Every day | ORAL | Status: DC
  Administered 2017-08-14 – 2017-08-18 (×5): 1 via ORAL
  Filled 2017-08-14 (×8): qty 1

## 2017-08-14 MED ORDER — LOPERAMIDE HCL 2 MG PO CAPS: 2.0000 mg | ORAL_CAPSULE | ORAL | Status: AC | PRN

## 2017-08-14 MED ORDER — LORAZEPAM 1 MG PO TABS
1.0000 mg | ORAL_TABLET | Freq: Two times a day (BID) | ORAL | Status: AC
  Administered 2017-08-16 (×2): 1 mg via ORAL
  Filled 2017-08-14 (×2): qty 1

## 2017-08-14 MED ORDER — PRAZOSIN HCL 1 MG PO CAPS
1.0000 mg | ORAL_CAPSULE | Freq: Every day | ORAL | Status: DC
  Administered 2017-08-14 – 2017-08-17 (×3): 1 mg via ORAL
  Filled 2017-08-14 (×7): qty 1

## 2017-08-14 MED ORDER — SERTRALINE HCL 50 MG PO TABS
50.0000 mg | ORAL_TABLET | Freq: Every day | ORAL | Status: DC
  Administered 2017-08-14 – 2017-08-18 (×5): 50 mg via ORAL
  Filled 2017-08-14 (×8): qty 1

## 2017-08-14 MED ORDER — LORAZEPAM 1 MG PO TABS
1.0000 mg | ORAL_TABLET | Freq: Three times a day (TID) | ORAL | Status: AC
  Administered 2017-08-15 (×3): 1 mg via ORAL
  Filled 2017-08-14 (×3): qty 1

## 2017-08-14 MED ORDER — PALIPERIDONE ER 6 MG PO TB24
6.0000 mg | ORAL_TABLET | Freq: Every day | ORAL | Status: DC
  Administered 2017-08-14 – 2017-08-15 (×2): 6 mg via ORAL
  Filled 2017-08-14 (×4): qty 1

## 2017-08-14 MED ORDER — LORAZEPAM 1 MG PO TABS: 1.0000 mg | ORAL_TABLET | Freq: Four times a day (QID) | ORAL | Status: DC | PRN

## 2017-08-14 MED ORDER — OLANZAPINE 5 MG PO TBDP
5.0000 mg | ORAL_TABLET | Freq: Two times a day (BID) | ORAL | Status: DC | PRN
  Administered 2017-08-14 – 2017-08-17 (×4): 5 mg via ORAL
  Filled 2017-08-14 (×3): qty 1

## 2017-08-14 MED ORDER — VITAMIN B-1 100 MG PO TABS
100.0000 mg | ORAL_TABLET | Freq: Every day | ORAL | Status: DC
  Administered 2017-08-15 – 2017-08-18 (×4): 100 mg via ORAL
  Filled 2017-08-14 (×7): qty 1

## 2017-08-14 NOTE — [facility] Suicide Risk Assessment (Signed)
[facility] Admission Suicide Risk Assessment   Nursing information obtained from:  Patient Demographic factors:  Caucasian, Unemployed Current Mental Status:  NA Loss Factors:  Loss of significant relationship, Financial problems / change in socioeconomic status Historical Factors:  Family history of mental illness or substance abuse, Impulsivity Risk Reduction Factors:  NA  Total Time spent with patient: 30 minutes Principal Problem: Schizoaffective disorder, depressive type ([place]) Diagnosis:   Patient Active Problem List   Diagnosis Date Noted  . ADD (attention deficit disorder) [F98.8] 08/14/2017  . Severe benzodiazepine use disorder ([place]) [F13.20] 08/14/2017  . Alcohol abuse [F10.10] 03/13/2017  . Drug overdose [T50.901A]   . Schizoaffective disorder, depressive type ([place]) [F25.1] 01/16/2017  . PTSD (post-traumatic stress disorder) [F43.10] 01/16/2017  . Panic disorder [F41.0] 01/16/2017  . Acute diverticulitis [K57.92] 04/26/2016  . Diverticulitis of large intestine with abscess without bleeding [K57.20] 04/26/2016  . Intractable migraine [G43.919] 07/27/2014  . Insomnia [G47.00] 05/26/2013  . Adjustment disorder with anxious mood [F43.22] 12/06/2012  . 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 She has been trying to communicate , but they do not listen. She has been anxious, not sleeping and has been hearing voices nonstop which makes her more anxious. Pt has been on xanax prescribed her outpatient provider, but she Overdosed on it . ON review of [place] controlled substance database Pt received multiple prescriptions for BZD recently 07/20/2017 - Klonopin 1 mg - 20 pills 07/22/2017 - xanax 2 mg - 60 pills 07/26/2017 - xanax 1 mg - 90 pills Pt is also on Dexmethylphenidate ER 40 mg - filled  07/22/2017 Dexmethylphenidate 10 mg - filled 07/26/2017  Patient's UDS was negative for BZD, stimulants Unknown if she is misusing or diverting these medications Will let her outpatient provider know.    Continued Clinical Symptoms:  Alcohol Use Disorder Identification Test Final Score (AUDIT): 4 The "Alcohol Use Disorders Identification Test", Guidelines for Use in Primary Care, Second Edition.  World Pharmacologist [facility]). Score between 0-7:  no or low risk or alcohol related problems. Score between 8-15:  moderate risk of alcohol related problems. Score between 16-19:  high risk of alcohol related problems. Score 20 or above:  warrants further diagnostic evaluation for alcohol dependence and treatment.   CLINICAL FACTORS:   Bipolar Disorder:  DEPRESSED Alcohol/Substance Abuse/Dependencies Currently Psychotic Previous Psychiatric Diagnoses and Treatments   Musculoskeletal: Strength & Muscle Tone: within normal limits Gait & Station: normal Patient leans: N/A  Psychiatric Specialty Exam: Physical Exam  Review of Systems  Psychiatric/Behavioral: Positive for depression and hallucinations. The patient is nervous/anxious and has insomnia.   All other systems reviewed and are negative.   Blood pressure (!) 127/53, pulse 77, temperature 98.6 F (37 C), resp. rate 16, height 5\' 1"  (1.549 m), weight 87.1 kg (192 lb).Body mass index is 36.28 kg/m.  General Appearance: Casual  Eye Contact:  Fair  Speech:  Normal Rate  Volume:  Normal  Mood:  Anxious, Depressed and Dysphoric  Affect:  Appropriate  Thought Process:  Goal Directed and Descriptions of Associations: Circumstantial  Orientation:  Full (Time, Place, and Person)  Thought Content:  Hallucinations: Auditory and Rumination  Suicidal Thoughts:  s/p od on xanax   Homicidal Thoughts:  No  Memory:  Immediate;   Fair Recent;   Fair Remote;   Fair  Judgement:  Impaired  Insight:  Shallow  Psychomotor Activity:  Normal   Concentration:  Concentration: Fair and Attention Span: Fair  Recall:  [employer] of Knowledge:  Fair  Language:  Fair  Akathisia:  No  Handed:  Right  AIMS (if indicated):     Assets:  Desire for Improvement  ADL's:  Intact  Cognition:  WNL  Sleep:  Number of Hours: 6.5      COGNITIVE FEATURES THAT CONTRIBUTE TO RISK:  Closed-mindedness, Polarized thinking and Thought constriction (tunnel vision)    SUICIDE RISK:   Moderate:  Frequent suicidal ideation with limited intensity, and duration, some specificity in terms of plans, no associated intent, good self-control, limited dysphoria/symptomatology, some risk factors present, and identifiable protective factors, including available and accessible social support.  PLAN OF CARE: Will start Invega ER 6 mg po daily for mood sx/psychosis, zoloft 50 mg for affective sx, PTSD  Remeron 7.5 mg po qhs for insomnia. Case discussed with Melburn Popper NP - Pls also see HP.  I certify that inpatient services furnished can reasonably be expected to improve the patient's condition.   Earnest Mcgillis, MD 08/14/2017, 12:42 PM
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
## 2017-08-14 NOTE — Progress Notes (Signed)
Pt is in bed at shift change and does not attend group.  Pt sts she still feels like her head hurts but does not ask for medication although it was offered.  Pt denies SI, HI and AVH and verbally contracts for safety.  Pt is med compliant, and cooperative.Pt denies pain other than HA.  Pt later asks for snacks. Pt given snacks and offered support and encouragement. Pt remains safe on unit

## 2017-08-14 NOTE — Progress Notes (Signed)
Psychoeducational Group Note  Date:  08/14/2017 Time:  2148  Group Topic/Focus:  Wrap-Up Group:   The focus of this group is to help patients review their daily goal of treatment and discuss progress on daily workbooks.  Participation Level: Did Not Attend  Participation Quality:  Not Applicable  Affect:  Not Applicable  Cognitive:  Not Applicable  Insight:  Not Applicable  Engagement in Group: Not Applicable  Additional Comments:  The patient did not attend group this evening.   Archie Balboa S 08/14/2017, 9:48 PM

## 2017-08-14 NOTE — B[facility] Counselor (Signed)
Attempted to do Psychosocial Assessment with patient, but she was in her bed, complained of a migraine which she said is worsened by talking.  CSW will attempt again tomorrow.  Selmer Dominion, LCSW 08/14/2017, 3:27 PM

## 2017-08-14 NOTE — [facility] Group Notes (Signed)
[place] LCSW Group Therapy Note  Date/Time:  08/14/2017  11:00AM-12:00PM  Type of Therapy and Topic:  Group Therapy:  Music and Mood  Participation Level:  Did Not Attend   Description of Group: In this process group, members listened to a variety of genres of music and identified that different types of music evoke different responses.  Patients were encouraged to identify music that was soothing for them and music that was energizing for them.  Patients discussed how this knowledge can help with wellness and recovery in various ways including managing depression and anxiety as well as encouraging healthy sleep habits.     Selmer Dominion, LCSW 08/14/2017 8:36 AM

## 2017-08-14 NOTE — Progress Notes (Signed)
In bed until dinner with c/o severe headache. Fluids and food encouraged.  D: Patient's self inventory sheet: patient has poor sleep, received sleep medication.fair  Appetite, low energy level, poor concentration. Rated depression 8/10, hopeless 10/10, anxiety 10/10. SI/HI/AVH: denies all. Physical complaints are withdrawal related - headache, stomach ache, agitation, chills, irritability. . Goal is "talk to my daughter". Plans to work on "call her until she answers". States "I'm very fragile"   A: Medications administered, assessed medication knowledge and education given on medication regimen.  Emotional support and encouragement given patient. R: Denies SI and HI , contracts for safety. Safety maintained with 15 minute checks.

## 2017-08-15 LAB — HEMOGLOBIN A1C
Hgb A1c MFr Bld: 4.6 % — ABNORMAL LOW (ref 4.8–5.6)
Mean Plasma Glucose: 85.32 mg/dL

## 2017-08-15 LAB — LIPID PANEL
CHOL/HDL RATIO: 3.1 ratio
CHOLESTEROL: 215 mg/dL — AB (ref 0–200)
HDL: 69 mg/dL (ref >40)
LDL CALC: 110 mg/dL — AB (ref 0–99)
Triglycerides: 178 mg/dL — ABNORMAL HIGH (ref <150)
VLDL: 36 mg/dL (ref 0–40)

## 2017-08-15 LAB — TSH: TSH: 0.598 u[IU]/mL (ref 0.350–4.500)

## 2017-08-15 NOTE — Tx Team (Signed)
Interdisciplinary Treatment and Diagnostic Plan Update  08/15/2017 Time of Session: 10:04 AM  [name] MRN: [id number]  Principal Diagnosis: Schizoaffective disorder, depressive type [facility])  Secondary Diagnoses: Principal Problem:   Schizoaffective disorder, depressive type ([place]) Active Problems:   PTSD (post-traumatic stress disorder)   ADD (attention deficit disorder)   Severe benzodiazepine use disorder ([place])   Current Medications:  Current Facility-Administered Medications  Medication Dose Route Frequency Provider Last Rate Last Dose  . acetaminophen (TYLENOL) tablet 650 mg  650 mg Oral Q6H PRN Okonkwo, Justina A, NP   650 mg at 08/14/17 1540  . alum & mag hydroxide-simeth (MAALOX/MYLANTA) 200-200-20 MG/5ML suspension 30 mL  30 mL Oral Q4H PRN Okonkwo, Justina A, NP      . benztropine (COGENTIN) tablet 0.5 mg  0.5 mg Oral Daily Starkes, Takia S, FNP   0.5 mg at 08/15/17 0754  . hydrOXYzine (ATARAX/VISTARIL) tablet 25 mg  25 mg Oral Q6H PRN Eappen, Saramma, MD      . ibuprofen (ADVIL,MOTRIN) tablet 400 mg  400 mg Oral Q6H PRN Lindon Romp A, NP   400 mg at 08/14/17 1214  . loperamide (IMODIUM) capsule 2-4 mg  2-4 mg Oral PRN Eappen, Saramma, MD      . LORazepam (ATIVAN) tablet 1 mg  1 mg Oral Q6H PRN Eappen, Saramma, MD      . LORazepam (ATIVAN) tablet 1 mg  1 mg Oral TID Ursula Alert, MD   1 mg at 08/15/17 0754   Followed by  . [START ON 08/16/2017] LORazepam (ATIVAN) tablet 1 mg  1 mg Oral BID Ursula Alert, MD       Followed by  . [START ON 08/17/2017] LORazepam (ATIVAN) tablet 1 mg  1 mg Oral Daily Eappen, Saramma, MD      . magnesium hydroxide (MILK OF MAGNESIA) suspension 30 mL  30 mL Oral Daily PRN Okonkwo, Justina A, NP      . mirtazapine (REMERON) tablet 7.5 mg  7.5 mg Oral QHS Priscille Loveless S, FNP   7.5 mg at 08/14/17 2130  . multivitamin with minerals tablet 1 tablet  1 tablet Oral Daily Ursula Alert, MD   1 tablet at 08/15/17 0754  . nicotine (NICODERM CQ -  dosed in mg/24 hours) patch 21 mg  21 mg Transdermal Daily Eappen, Ria Clock, MD   21 mg at 08/15/17 0753  . OLANZapine zydis (ZYPREXA) disintegrating tablet 5 mg  5 mg Oral BID PRN Ursula Alert, MD   5 mg at 08/14/17 1540  . ondansetron (ZOFRAN-ODT) disintegrating tablet 4 mg  4 mg Oral Q6H PRN Eappen, Saramma, MD      . paliperidone (INVEGA) 24 hr tablet 6 mg  6 mg Oral QHS Eappen, Saramma, MD   6 mg at 08/14/17 2130  . prazosin (MINIPRESS) capsule 1 mg  1 mg Oral QHS Eappen, Saramma, MD   1 mg at 08/14/17 2130  . sertraline (ZOLOFT) tablet 50 mg  50 mg Oral Daily Eappen, Ria Clock, MD   50 mg at 08/15/17 0754  . thiamine (VITAMIN B-1) tablet 100 mg  100 mg Oral Daily Eappen, Ria Clock, MD   100 mg at 08/15/17 0754    PTA Medications: Prescriptions Prior to Admission  Medication Sig Dispense Refill Last Dose  . acetaminophen (TYLENOL) 325 MG tablet Take 2 tablets (650 mg total) by mouth every 6 (six) hours as needed. For fever/pain   08/12/2017 at PRN  . budesonide (PULMICORT) 180 MCG/ACT inhaler Inhale 1 puff into the lungs  2 (two) times daily.     Marland Kitchen dexmethylphenidate (FOCALIN) 10 MG tablet Take 20 mg by mouth daily at 12 noon.     . eszopiclone (LUNESTA) 1 MG TABS tablet Take 1 mg by mouth at bedtime as needed for sleep. Take immediately before bedtime     . naphazoline-glycerin (CLEAR EYES) 0.012-0.2 % SOLN 1-2 drops 4 (four) times daily as needed for eye irritation.     . paliperidone (INVEGA) 6 MG 24 hr tablet Take 6 mg by mouth at bedtime.     Marland Kitchen alprazolam (XANAX) 2 MG tablet Take 1 mg by mouth 3 (three) times daily.    08/12/2017 at Unknown time  . aspirin EC 81 MG tablet Take 81 mg by mouth every other day.    08/13/2017 at Unknown time  . benztropine (COGENTIN) 1 MG tablet Take 1 mg by mouth 3 (three) times daily.  1 08/13/2017 at Unknown time  . Dexmethylphenidate HCl 40 MG CP24 Take 1 capsule by mouth daily.   08/13/2017 at Unknown time  . hydrOXYzine (ATARAX/VISTARIL) 25 MG tablet Take 1  tablet (25 mg total) by mouth 3 (three) times daily as needed for anxiety. (Patient not taking: Reported on 03/04/2017) 60 tablet 0 Not Taking at Unknown time  . ibuprofen (ADVIL) 200 MG tablet Take 400 mg by mouth every 6 (six) hours as needed (for headaches).   unknown at PRN  . lurasidone (LATUDA) 20 MG TABS tablet Take 1 tablet (20 mg total) by mouth at bedtime. For mood control (Patient not taking: Reported on 03/04/2017) 30 tablet 0 Not Taking at Unknown time  . Multiple Vitamin (MULTIVITAMIN WITH MINERALS) TABS tablet Take 1 tablet by mouth daily.   08/12/2017 at Unknown time  . nicotine (NICODERM CQ - DOSED IN MG/24 HOURS) 21 mg/24hr patch Place 1 patch (21 mg total) onto the skin daily at 6 (six) AM. For smoking cessation (Patient not taking: Reported on 03/04/2017) 28 patch 0 Not Taking at Unknown time  . sertraline (ZOLOFT) 100 MG tablet Take 1 tablet (100 mg total) by mouth daily. For depression (Patient not taking: Reported on 07/17/2017) 30 tablet 0 Not Taking at Unknown time  . traZODone (DESYREL) 50 MG tablet Take 1 tablet (50 mg total) by mouth at bedtime as needed for sleep. (Patient not taking: Reported on 07/17/2017) 30 tablet 0 Not Taking at Unknown time    Patient Stressors: Financial difficulties Health problems Marital or family conflict Occupational concerns  Patient Strengths: Capable of independent living Curator fund of knowledge  Treatment Modalities: Medication Management, Group therapy, Case management,  1 to 1 session with clinician, Psychoeducation, Recreational therapy.   Physician Treatment Plan for Primary Diagnosis: Schizoaffective disorder, depressive type ([place]) Long Term Goal(s): Improvement in symptoms so as ready for discharge  Short Term Goals: Ability to identify changes in lifestyle to reduce recurrence of condition will improve Ability to verbalize feelings will improve Ability to disclose and discuss suicidal ideas Ability to  demonstrate self-control will improve Compliance with prescribed medications will improve Ability to verbalize feelings will improve Ability to identify and develop effective coping behaviors will improve Ability to maintain clinical measurements within normal limits will improve Compliance with prescribed medications will improve  Medication Management: Evaluate patient's response, side effects, and tolerance of medication regimen.  Therapeutic Interventions: 1 to 1 sessions, Unit Group sessions and Medication administration.  Evaluation of Outcomes: Progressing  Physician Treatment Plan for Secondary Diagnosis: Principal Problem:   Schizoaffective disorder, depressive  type [facility]) Active Problems:   PTSD (post-traumatic stress disorder)   ADD (attention deficit disorder)   Severe benzodiazepine use disorder ([place])   Long Term Goal(s): Improvement in symptoms so as ready for discharge  Short Term Goals: Ability to identify changes in lifestyle to reduce recurrence of condition will improve Ability to verbalize feelings will improve Ability to disclose and discuss suicidal ideas Ability to demonstrate self-control will improve Compliance with prescribed medications will improve Ability to verbalize feelings will improve Ability to identify and develop effective coping behaviors will improve Ability to maintain clinical measurements within normal limits will improve Compliance with prescribed medications will improve  Medication Management: Evaluate patient's response, side effects, and tolerance of medication regimen.  Therapeutic Interventions: 1 to 1 sessions, Unit Group sessions and Medication administration.  Evaluation of Outcomes: Progressing   RN Treatment Plan for Primary Diagnosis: Schizoaffective disorder, depressive type ([place]) Long Term Goal(s): Knowledge of disease and therapeutic regimen to maintain health will improve  Short Term Goals: Ability to disclose and  discuss suicidal ideas and Ability to identify and develop effective coping behaviors will improve  Medication Management: RN will administer medications as ordered by provider, will assess and evaluate patient's response and provide education to patient for prescribed medication. RN will report any adverse and/or side effects to prescribing provider.  Therapeutic Interventions: 1 on 1 counseling sessions, Psychoeducation, Medication administration, Evaluate responses to treatment, Monitor vital signs and CBGs as ordered, Perform/monitor CIWA, COWS, AIMS and Fall Risk screenings as ordered, Perform wound care treatments as ordered.  Evaluation of Outcomes: Progressing   LCSW Treatment Plan for Primary Diagnosis: Schizoaffective disorder, depressive type ([place]) Long Term Goal(s): Safe transition to appropriate next level of care at discharge, Engage patient in therapeutic group addressing interpersonal concerns.  Short Term Goals: Engage patient in aftercare planning with referrals and resources  Therapeutic Interventions: Assess for all discharge needs, 1 to 1 time with Social worker, Explore available resources and support systems, Assess for adequacy in community support network, Educate family and significant other(s) on suicide prevention, Complete Psychosocial Assessment, Interpersonal group therapy.  Evaluation of Outcomes: Met  Return home, follow up [facility]   Progress in Treatment: Attending groups: Yes Participating in groups: Yes Taking medication as prescribed: Yes Toleration medication: Yes, no side effects reported at this time Family/Significant other contact made: No Patient understands diagnosis: Yes AEB asking for help with depression and anxiety Discussing patient identified problems/goals with staff: Yes Medical problems stabilized or resolved: Yes Denies suicidal/homicidal ideation: Yes Issues/concerns per patient self-inventory: None Other: N/A  New problem(s)  identified: None identified at this time.   New Short Term/Long Term Goal(s): "Anxiety and schizophrenia are my main problems.  It doesn't allow me to drive to work, or do much of anything.  My living situation is really stressful.  I'm living with my daughter and her boyfriend just moved in.  He has no respect for me."  Discharge Plan or Barriers:   Reason for Continuation of Hospitalization: Anxiety  Depression Hallucinations  Medication stabilization Withdrawal symptoms  Estimated Length of Stay: 8/31  Attendees: Patient: Deborah Pena 08/15/2017  10:04 AM  Physician: Ursula Alert, MD 08/15/2017  10:04 AM  Nursing: Eulogio Bear, RN 08/15/2017  10:04 AM  RN Care Manager: Lars Pinks, RN 08/15/2017  10:04 AM  Social Worker: Ripley Fraise 08/15/2017  10:04 AM  Recreational Therapist: Winfield Cunas 08/15/2017  10:04 AM  Other: Norberto Sorenson 08/15/2017  10:04 AM  Other:  08/15/2017  10:04  AM    Scribe for Treatment Team:  Roque Lias LCSW 08/15/2017 10:04 AM

## 2017-08-15 NOTE — Plan of Care (Signed)
Problem: Activity: Goal: Interest or engagement in activities will improve Outcome: Not Progressing Pt did not attend wrap-up group.
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
## 2017-08-15 NOTE — Progress Notes (Signed)
DAR NOTE: Pt present with flat affect and depressed mood in the unit. Pt has been isolating herself and has in been bed most of the time. Pt approached the writer stating she is very depressed, pt stated her daughter is stressing her, her daughter told her that she is ashamed of her. Pt has been tearful, denies physical pain, took all her meds as scheduled. As per self inventory, pt had a good night sleep, good appetite, low energy, and poor concentration. Pt rate depression at 6, hopeless ness at 0, and anxiety at 10. Pt's safety ensured with 15 minute and environmental checks. Pt currently denies SI/HI and A/V hallucinations. Pt verbally agrees to seek staff if SI/HI or A/VH occurs and to consult with staff before acting on these thoughts. Will continue POC.

## 2017-08-15 NOTE — Progress Notes (Signed)
Recreation Therapy Notes  Date: 08/15/17 Time: 1015 Location: 500 Hall Dayroom  Group Topic: Life Goals, Goal Setting  Goal Area(s) Addresses:  Patient will be able to identify at least 3 life goals.  Patient will be able to identify benefit of investing in life goals.  Patient will be able to identify benefit of setting life goals.   Behavioral Response:  Minimal  Intervention: Goal sheet, pencils  Activity: Life Goals.  Patients were given a worksheet that identified six areas (family, friends, spirituality, work/school, body and mental health).  Patients were to identify the things they do well, what they needed to improve and set a goal to make the improvement.  Patients would then pick their top three categories to share with the group.  Education:  Discharge Planning, Coping Skills, Life Goals   Education Outcome: Acknowledges Education/In Group Clarification Provided/Needs Additional Education  Clinical Observations:   Pt stated goals make you feel better.  Pt left early with social worker and returned during processing.   [name], LRT/CTRS         [name] A 08/15/2017 12:38 PM

## 2017-08-15 NOTE — BHH Suicide Risk Assessment (Signed)
[place] INPATIENT:  Family/Significant Other Suicide Prevention Education  Suicide Prevention Education:  Patient Refusal for Family/Significant Other Suicide Prevention Education: The patient Deborah Pena has refused to provide written consent for family/significant other to be provided Family/Significant Other Suicide Prevention Education during admission and/or prior to discharge.  Physician notified.  Ten Sleep 08/15/2017, 11:05 AM

## 2017-08-15 NOTE — Progress Notes (Signed)
  DATA ACTION RESPONSE  Objective- Pt. is visible in the room,seen resting in bed with eyes closed. Pt was awaken for assessment. Presents with a depressed/sad affect and mood. Was minimal and guarded with interaction. Isolative to room provided with encouragement.Snacks/fluid offered; Pt refused. No abnormal s/s.   Subjective- Denies having any SI/HI/AVH/Pain at this time.Pt. states "I've been sleeping really good; I just feel so depressed". Is cooperative and remain safe on the unit.  1:1 interaction in private to establish rapport. Encouragement, education, & support given from staff.  PRN Vistaril equested and will re-eval accordingly.   Safety maintained with Q 15 checks. Continue with POC.

## 2017-08-15 NOTE — BHH Group Notes (Signed)
[facility] Mental Health Association Group Therapy  08/15/2017 , 4:05 PM    Type of Therapy:  Mental Health Association Presentation  Participation Level:  Active  Participation Quality:  Attentive  Affect:  Blunted  Cognitive:  Oriented  Insight:  Limited  Engagement in Therapy:  Engaged  Modes of Intervention:  Discussion, Education and Socialization  Summary of Progress/Problems:  Tammi  from [place] came to present her recovery story, encourage group  members to share something about their story, and present information about the MHA.  Invited.  Chose to not attend.  Roque Lias B 08/15/2017 , 4:05 PM

## 2017-08-15 NOTE — Progress Notes (Signed)
Did not attend group 

## 2017-08-15 NOTE — Progress Notes (Signed)
[facility] MD Progress Note  08/15/2017 11:14 AM Deborah Pena  MRN:  [id number] Subjective: I feel better than I did yesterday. I think all I needed was rest. I feel so better now. We might have found what works for me to sleep. I didn't mean to take all those pills I just wanted to sleep.   Per nursing:  In bed until dinner with c/o severe headache. Fluids and food encouraged.   Patient's self inventory sheet: patient has poor sleep, received sleep medication.fair  Appetite, low energy level, poor concentration. Rated depression 8/10, hopeless 10/10, anxiety 10/10. SI/HI/AVH: denies all. Physical complaints are withdrawal related - headache, stomach ache, agitation, chills, irritability. . Goal is "talk to my daughter". Plans to work on "call her until she answers". States "I'm very fragile"  Medications administered, assessed medication knowledge and education given on medication regimen.  Emotional support and encouragement given patient.  Objective : I have discussed case with treatment team and have met with patient. She is observed during well this morning, she is alert and oriented. She is compliant with her medication at this time, and is tolerating it well. She denies any disturbance of sleep or eating at this time. She is engaging well with her peers and active on the mileu. She denies any active suicidal ideation, homicidal ideations, hallucinations and does not appear to be responding to internal stimuli. She is able to contract for safety while on the unit.   Principal Problem: Schizoaffective disorder, depressive type (HCC) Diagnosis:   Patient Active Problem List   Diagnosis Date Noted  . ADD (attention deficit disorder) [F98.8] 08/14/2017  . Severe benzodiazepine use disorder (HCC) [F13.20] 08/14/2017  . Alcohol abuse [F10.10] 03/13/2017  . Drug overdose [T50.901A]   . Schizoaffective disorder, depressive type (HCC) [F25.1] 01/16/2017  . PTSD (post-traumatic stress disorder)  [F43.10] 01/16/2017  . Panic disorder [F41.0] 01/16/2017  . Acute diverticulitis [K57.92] 04/26/2016  . Diverticulitis of large intestine with abscess without bleeding [K57.20] 04/26/2016  . Intractable migraine [G43.919] 07/27/2014  . Insomnia [G47.00] 05/26/2013  . Adjustment disorder with anxious mood [F43.22] 12/06/2012  . 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 Anemia   . Anginal pain (HCC)   . Anxiety   . Arthritis    "left hand" (04/26/2016)  . Asthma   . Chronic lower back pain   . Diverticulitis of colon   . GERD (gastroesophageal reflux disease)   . Headache    'once or twice/week" (04/26/2016)  . Migraine    "twice/month" (04/26/2016)  . Stomach ulcer     Past Surgical History:  Procedure Laterality Date  . ABDOMINAL HYSTERECTOMY  2013   preformed at [facility] by Dr Wagoner in April  . BREAST SURGERY Bilateral ~ 2000   "dual lateral duct removal"   . BUNIONECTOMY Right 2013  . CESAREAN SECTION  1999  . OOPHORECTOMY Right 2013  . TONSILLECTOMY     Family History:  Family History  Problem Relation Age of Onset  . Heart failure Father   . Diabetes Other   . Breast cancer Other   . Diverticulitis Other   . Mental illness Paternal Grandmother   . Mental illness Paternal Grandfather   . Schizophrenia Paternal Aunt   . Suicidality Maternal Aunt    Social History:  History  Alcohol Use No     History  Drug   Use No    Social History   Social History  . Marital status: Married    Spouse name: N/A  . Number of children: N/A  . Years of education: N/A   Social History Main Topics  . Smoking status: Former Smoker    Packs/day: 2.00    Years: 30.00    Types: Cigarettes  . Smokeless tobacco: Former User    Types: Chew     Comment: "quit smoking cigarettes in ~ 2011; chewed when I was little"  . Alcohol use No  .  Drug use: No  . Sexual activity: Not Currently   Other Topics Concern  . None   Social History Narrative  . None   Additional Social History:   Sleep: improved   Appetite:  Good  Current Medications: Current Facility-Administered Medications  Medication Dose Route Frequency Provider Last Rate Last Dose  . acetaminophen (TYLENOL) tablet 650 mg  650 mg Oral Q6H PRN Okonkwo, Justina A, NP   650 mg at 08/14/17 1540  . alum & mag hydroxide-simeth (MAALOX/MYLANTA) 200-200-20 MG/5ML suspension 30 mL  30 mL Oral Q4H PRN Okonkwo, Justina A, NP      . benztropine (COGENTIN) tablet 0.5 mg  0.5 mg Oral Daily Starkes, Takia S, FNP   0.5 mg at 08/15/17 0754  . hydrOXYzine (ATARAX/VISTARIL) tablet 25 mg  25 mg Oral Q6H PRN Eappen, Saramma, MD      . ibuprofen (ADVIL,MOTRIN) tablet 400 mg  400 mg Oral Q6H PRN Berry, Jason A, NP   400 mg at 08/14/17 1214  . loperamide (IMODIUM) capsule 2-4 mg  2-4 mg Oral PRN Eappen, Saramma, MD      . LORazepam (ATIVAN) tablet 1 mg  1 mg Oral Q6H PRN Eappen, Saramma, MD      . LORazepam (ATIVAN) tablet 1 mg  1 mg Oral TID Eappen, Saramma, MD   1 mg at 08/15/17 0754   Followed by  . [START ON 08/16/2017] LORazepam (ATIVAN) tablet 1 mg  1 mg Oral BID Eappen, Saramma, MD       Followed by  . [START ON 08/17/2017] LORazepam (ATIVAN) tablet 1 mg  1 mg Oral Daily Eappen, Saramma, MD      . magnesium hydroxide (MILK OF MAGNESIA) suspension 30 mL  30 mL Oral Daily PRN Okonkwo, Justina A, NP      . mirtazapine (REMERON) tablet 7.5 mg  7.5 mg Oral QHS Starkes, Takia S, FNP   7.5 mg at 08/14/17 2130  . multivitamin with minerals tablet 1 tablet  1 tablet Oral Daily Eappen, Saramma, MD   1 tablet at 08/15/17 0754  . nicotine (NICODERM CQ - dosed in mg/24 hours) patch 21 mg  21 mg Transdermal Daily Eappen, Saramma, MD   21 mg at 08/15/17 0753  . OLANZapine zydis (ZYPREXA) disintegrating tablet 5 mg  5 mg Oral BID PRN Eappen, Saramma, MD   5 mg at 08/15/17 1045  . ondansetron  (ZOFRAN-ODT) disintegrating tablet 4 mg  4 mg Oral Q6H PRN Eappen, Saramma, MD      . paliperidone (INVEGA) 24 hr tablet 6 mg  6 mg Oral QHS Eappen, Saramma, MD   6 mg at 08/14/17 2130  . prazosin (MINIPRESS) capsule 1 mg  1 mg Oral QHS Eappen, Saramma, MD   1 mg at 08/14/17 2130  . sertraline (ZOLOFT) tablet 50 mg  50 mg Oral Daily Eappen, Saramma, MD   50 mg at 08/15/17 0754  . thiamine (VITAMIN B-1) tablet   100 mg  100 mg Oral Daily Ursula Alert, MD   100 mg at 08/15/17 5916    Lab Results:  Results for orders placed or performed during the hospital encounter of 08/13/17 (from the past 48 hour(s))  Hemoglobin A1c     Status: Abnormal   Collection Time: 08/15/17  6:18 AM  Result Value Ref Range   Hgb A1c MFr Bld 4.6 (L) 4.8 - 5.6 %    Comment: (NOTE) Pre diabetes:          5.7%-6.4% Diabetes:              >6.4% Glycemic control for   <7.0% adults with diabetes    Mean Plasma Glucose 85.32 mg/dL    Comment: Performed at [facility] [address], [place], [place] [address]  Lipid panel     Status: Abnormal   Collection Time: 08/15/17  6:18 AM  Result Value Ref Range   Cholesterol 215 (H) 0 - 200 mg/dL   Triglycerides 178 (H) <150 mg/dL   HDL 69 >40 mg/dL   Total CHOL/HDL Ratio 3.1 RATIO   VLDL 36 0 - 40 mg/dL   LDL Cholesterol 110 (H) 0 - 99 mg/dL    Comment:        Total Cholesterol/HDL:CHD Risk Coronary Heart Disease Risk Table                     Men   Women  1/2 Average Risk   3.4   3.3  Average Risk       5.0   4.4  2 X Average Risk   9.6   7.1  3 X Average Risk  23.4   11.0        Use the calculated Patient Ratio above and the CHD Risk Table to determine the patient's CHD Risk.        ATP III CLASSIFICATION (LDL):  <100     mg/dL   Optimal  100-129  mg/dL   Near or Above                    Optimal  130-159  mg/dL   Borderline  160-189  mg/dL   High  >190     mg/dL   Very High Performed at [facility] [address], [place], [place]  [address]   TSH     Status: None   Collection Time: 08/15/17  6:18 AM  Result Value Ref Range   TSH 0.598 0.350 - 4.500 uIU/mL    Comment: Performed by a 3rd Generation assay with a functional sensitivity of <=0.01 uIU/mL. Performed at [facility], [place] [address], [place], [place] [address]     Blood Alcohol level:  Lab Results  Component Value Date   ETH <5 08/13/2017   ETH 85 (H) 93/90/3009    Metabolic Disorder Labs: Lab Results  Component Value Date   HGBA1C 4.6 (L) 08/15/2017   MPG 85.32 08/15/2017   MPG 91 01/17/2017   Lab Results  Component Value Date   PROLACTIN 192.0 (H) 01/17/2017   Lab Results  Component Value Date   CHOL 215 (H) 08/15/2017   TRIG 178 (H) 08/15/2017   HDL 69 08/15/2017   CHOLHDL 3.1 08/15/2017   VLDL 36 08/15/2017   LDLCALC 110 (H) 08/15/2017   LDLCALC 88 01/17/2017    Physical Findings: AIMS: Facial and Oral Movements Muscles of Facial Expression: None, normal Lips and  Perioral Area: None, normal Jaw: None, normal Tongue: None, normal,Extremity Movements Upper (arms, wrists, hands, fingers): None, normal Lower (legs, knees, ankles, toes): None, normal, Trunk Movements Neck, shoulders, hips: None, normal, Overall Severity Severity of abnormal movements (highest score from questions above): None, normal Incapacitation due to abnormal movements: None, normal Patient's awareness of abnormal movements (rate only patient's report): No Awareness, Dental Status Current problems with teeth and/or dentures?: No Does patient usually wear dentures?: No  CIWA:  CIWA-Ar Total: 3 COWS:     Musculoskeletal: Strength & Muscle Tone: within normal limits Gait & Station: normal Patient leans: N/A  Psychiatric Specialty Exam: Physical Exam   ROS  no headache, no visual disturbances reported, no chest pain, no shortness of breath, no vomiting, no chills, no fever  Blood pressure 113/69, pulse 72, temperature 98.6 F (37 C),  resp. rate 16, height 5' 1" (1.549 m), weight 87.1 kg (192 lb).Body mass index is 36.28 kg/m.  General Appearance: improved grooming   Eye Contact:  Good  Speech:  Normal Rate  Volume:  Normal  Mood: better  Affect:  Constricted, not tearful  Thought Process:  Linear  Orientation:  Full (Time, Place, and Person)  Thought Content: WDL denies both visual and auditory,no delusions expressed, not internally preoccupied at this time  Suicidal Thoughts:  No denies any suicidal or self injurious plans or intentions and contracts for safety on unit   Homicidal Thoughts:  No  Memory:  recent and remote grossly intact  Judgement:  Other:  improving   Insight:  improving   Psychomotor Activity:  Normal- no tremors, no diaphoresis  Concentration:  Concentration: Good and Attention Span: Good  Recall:  Good  Fund of Knowledge:  Good  Language:  Good  Akathisia:  Negative  Handed:  Right  AIMS (if indicated):   no abnormal or involuntary movements noted or reported  Assets:  Financial Resources/Insurance Resilience  ADL's:  Intact  Cognition:  WNL  Sleep:  Number of Hours: 6.75   Assessment - Deborah O'Brienis a 51 y.o.caucasian female, who is separated , lives in [place] with her daughter (49) and her daughters boyfriend , has a hx of anxiety, psychosis, PTSD , depression , who presented to [facility] with SI and worsening anxiety sx.,Patient today seen as anxious, sad, hopeless. Pt reports her sx has been worsening since the past several weeks. Restart medications Invega and Zoloft due to appropriate responses in the past. UDS is negative however NCDRS Is notable for excessive use of benzodiazepines (07/10 xanax 20m #90, 08/01 Klonopin 168m#20, 07/22/2017 xanax 29m91m60, 07/26/2017 xanax 1mg25m0). Patient denies recent amphetamine use Although NCDRS is notable for Dexemethylphenidate ER 40mg86m and Dexemethylphenidate IR 10 #60. Last rx filled on 07/26/2017.   Treatment Plan Summary: Daily contact with  patient to assess and evaluate symptoms and progress in treatment, Medication management, Plan inpatient admission and medications as below Encourage ongoing group and milieu participation to work on coping skills and symptom reduction  Will start Zoloft 50 mg po daily for affective sx. Restart Ativan detox protocol- patient reports taking up to (12) 1 mg Xanax po daily. - verified [place] controlled substance database. Also consider starting Buspar or gabapentin for anxiety.  Trazodone 50 po qhs for sleep. Continue Invega 6mg p27mhs for psychosis. Consider initializing InvegaLorayne [place] prior to discharge to help with stabilization.  Remeron 7.5mg po67ms for insomnia Cogentin 0.5mg po 79m EPS   pharmacist, in order to determine if any medication  addition or adjustment is warranted . As noted, patient denies associated symptoms at this time Treatment team working on disposition planning options   Takia S Starkes, FNP 08/15/2017, 11:14 AM   Patient ID: Deborah Pena, female   DOB: 11/09/1966, 51 y.o.   MRN: [id number] Agree with NP Progress Note  

## 2017-08-16 DIAGNOSIS — E221 Hyperprolactinemia: Secondary | ICD-10-CM | POA: Clinically undetermined

## 2017-08-16 LAB — PROLACTIN: PROLACTIN: 172.1 ng/mL — AB (ref 4.8–23.3)

## 2017-08-16 MED ORDER — ZIPRASIDONE HCL 40 MG PO CAPS
40.0000 mg | ORAL_CAPSULE | Freq: Every day | ORAL | Status: DC
  Administered 2017-08-16 – 2017-08-17 (×2): 40 mg via ORAL
  Filled 2017-08-16 (×5): qty 1

## 2017-08-16 MED ORDER — LAMOTRIGINE 25 MG PO TABS
25.0000 mg | ORAL_TABLET | Freq: Every day | ORAL | Status: DC
  Administered 2017-08-16 – 2017-08-18 (×3): 25 mg via ORAL
  Filled 2017-08-16 (×6): qty 1

## 2017-08-16 NOTE — Progress Notes (Signed)
Recreation Therapy Notes  INPATIENT RECREATION THERAPY ASSESSMENT  Patient Details Name: Deborah Pena MRN: [id number] DOB: 06-18-1966 Today's Date: 08/16/2017  Patient Stressors: Family, Relationship, Work, Other (Comment) (Finances)  Pt stated she was here because the people around her thought she overdosed. Pt stated her husband leaving was a stressor, her daughter's boyfriend living with her is a stressor and not having a job is a stressor.  Coping Skills:   Isolate, Substance Abuse, Avoidance, Art/Dance, Music  Personal Challenges: Anger, Communication, Concentration, Decision-Making, Expressing Yourself, Problem-Solving, Relationships, Self-Esteem/Confidence, Social Interaction, Stress Management, Substance Abuse, Time Management, Trusting Others  Leisure Interests (2+):  Individual - Napping, Individual - Other (Comment), Social - Family (Take a bath)  Awareness of Community Resources:  Yes  Community Resources:  [place]  Current Use: No  If no, Barriers?: Transportation  Patient Strengths:  Network engineer, compassion  Patient Identified Areas of Improvement:  Understanding, patience  Current Recreation Participation:  Never  Patient Goal for Hospitalization:  "Figure out medicines, go home"  [place] of Residence:  [place] of Residence:  [place]  Current [place] (including self-harm):  No  Current HI:  No  Consent to Intern Participation: N/A   [name], LRT/CTRS  Ria Comment, Illyana Schorsch A 08/16/2017, 3:12 PM

## 2017-08-16 NOTE — [facility] Group Notes (Signed)
LCSW Group Therapy Note  08/16/2017 1:15pm  Type of Therapy and Topic:  Group Therapy: Avoiding Self-Sabotaging and Enabling Behaviors  Participation Level:  Minimal   Description of Group:   In this group, patients will learn how to identify obstacles, self-sabotaging and enabling behaviors, as well as: what are they, why do we do them and what needs these behaviors meet. Discuss unhealthy relationships and how to have positive healthy boundaries with those that sabotage and enable. Explore aspects of self-sabotage and enabling in yourself and how to limit these self-destructive behaviors in everyday life.   Therapeutic Goals: 1. Patient will identify one obstacle that relates to self-sabotage and enabling behaviors 2. Patient will identify one personal self-sabotaging or enabling behavior they did prior to admission 3. Patient will state a plan to change the above identified behavior 4. Patient will demonstrate ability to communicate their needs through discussion and/or role play.   Summary of Patient Progress:  [name] in with about 10 minutes left in group.  Willing to be questioned.  "Everything I do is self sabatoge because of all my negative self talk.  But I can't help it.  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   Therapeutic Modalities:   Cognitive Behavioral Therapy Person-Centered Therapy Motivational Interviewing   [name], [place] 08/16/2017 3:20 PM

## 2017-08-16 NOTE — Progress Notes (Signed)
Patient did not attend group.

## 2017-08-16 NOTE — Progress Notes (Signed)
[facility] MD Progress Note  08/16/2017 2:41 PM Rily Nickey  MRN:  [id number] Subjective:Patient states " I still feel depressed."    Objective :Patient seen and chart reviewed.Discussed patient with treatment team.  Pt continues to be distressed about her situation with her daughter. She has tried to set limits and rules at home , she feels her daughter and her boyfriend continues to not follow them. Pt reports her daughter has ADD, learning disability and eventhough is 49 y , acts as though she were 88 . Offered to have a family meeting , but pt declines. Pt reports she continues to have depressive sx and wants to focus on herself in getting better. Per RN , compliant on medications, denies ADRs.      Principal Problem: Schizoaffective disorder, depressive type ([place]) Diagnosis:   Patient Active Problem List   Diagnosis Date Noted  . 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 ADD (attention deficit disorder) [F98.8] 08/14/2017  . Severe benzodiazepine use disorder ([place]) [F13.20] 08/14/2017  . Alcohol abuse [F10.10] 03/13/2017  . Drug overdose [T50.901A]   . Schizoaffective disorder, depressive type ([place]) [F25.1] 01/16/2017  . PTSD (post-traumatic stress disorder) [F43.10] 01/16/2017  . Panic disorder [F41.0] 01/16/2017  . Acute diverticulitis [K57.92] 04/26/2016  . Diverticulitis of large intestine with abscess without bleeding [K57.20] 04/26/2016  . Intractable migraine [G43.919] 07/27/2014  . Insomnia [G47.00] 05/26/2013  . Adjustment disorder with anxious mood [F43.22] 12/06/2012  . 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 Anemia   . Anginal pain ([place])   . Anxiety   . Arthritis    "left hand" (04/26/2016)  . Asthma   . Chronic lower back pain   . Diverticulitis of colon   . GERD (gastroesophageal reflux disease)   . Headache     'once or twice/week" (04/26/2016)  . Migraine    "twice/month" (04/26/2016)  . Stomach ulcer     Past Surgical History:  Procedure Laterality Date  . ABDOMINAL HYSTERECTOMY  2013   preformed at [facility] by Dr Jacqualyn Posey in April  . BREAST SURGERY Bilateral ~ 2000   "dual lateral duct removal"   . BUNIONECTOMY Right 2013  . CESAREAN SECTION  1999  . OOPHORECTOMY Right 2013  . TONSILLECTOMY     Family History:  Family History  Problem Relation Age of Onset  . Heart failure Father   . Diabetes Other   . Breast cancer Other   . Diverticulitis Other   . Mental illness Paternal Grandmother   . Mental illness Paternal Grandfather   . Schizophrenia Paternal Aunt   . Suicidality Maternal Aunt    Social History:  History  Alcohol Use No     History  Drug Use No    Social History   Social History  . Marital status: Married    Spouse name: N/A  . Number of children: N/A  . Years of education: N/A   Social History Main Topics  . Smoking status: Former Smoker    Packs/day: 2.00    Years: 30.00    Types: Cigarettes  . Smokeless tobacco: Former Systems developer    Types: Chew     Comment: "quit smoking cigarettes in ~ 2011; chewed when I was little"  . Alcohol use No  . Drug use: No  . Sexual activity: Not Currently   Other Topics Concern  . None   Social  History Narrative  . None   Additional Social History:   Sleep: Fair  Appetite:  Fair  Current Medications: Current Facility-Administered Medications  Medication Dose Route Frequency Provider Last Rate Last Dose  . acetaminophen (TYLENOL) tablet 650 mg  650 mg Oral Q6H PRN Okonkwo, Justina A, NP   650 mg at 08/16/17 1147  . alum & mag hydroxide-simeth (MAALOX/MYLANTA) 200-200-20 MG/5ML suspension 30 mL  30 mL Oral Q4H PRN Okonkwo, Justina A, NP      . benztropine (COGENTIN) tablet 0.5 mg  0.5 mg Oral Daily Starkes, Takia S, FNP   0.5 mg at 08/16/17 0817  . hydrOXYzine (ATARAX/VISTARIL) tablet 25 mg  25 mg Oral Q6H PRN Ursula Alert, MD   25 mg at 08/15/17 2135  . ibuprofen (ADVIL,MOTRIN) tablet 400 mg  400 mg Oral Q6H PRN Lindon Romp A, NP   400 mg at 08/14/17 1214  . lamoTRIgine (LAMICTAL) tablet 25 mg  25 mg Oral Daily Zofia Peckinpaugh, MD      . loperamide (IMODIUM) capsule 2-4 mg  2-4 mg Oral PRN Ersa Delaney, MD      . LORazepam (ATIVAN) tablet 1 mg  1 mg Oral Q6H PRN Daziyah Cogan, MD      . LORazepam (ATIVAN) tablet 1 mg  1 mg Oral BID Ursula Alert, MD   1 mg at 08/16/17 0816   Followed by  . [START ON 08/17/2017] LORazepam (ATIVAN) tablet 1 mg  1 mg Oral Daily Sanaz Scarlett, MD      . magnesium hydroxide (MILK OF MAGNESIA) suspension 30 mL  30 mL Oral Daily PRN Okonkwo, Justina A, NP      . mirtazapine (REMERON) tablet 7.5 mg  7.5 mg Oral QHS Priscille Loveless S, FNP   7.5 mg at 08/15/17 2134  . multivitamin with minerals tablet 1 tablet  1 tablet Oral Daily Milaya Hora, MD   1 tablet at 08/16/17 0816  . nicotine (NICODERM CQ - dosed in mg/24 hours) patch 21 mg  21 mg Transdermal Daily Ursula Alert, MD   21 mg at 08/16/17 0817  . OLANZapine zydis (ZYPREXA) disintegrating tablet 5 mg  5 mg Oral BID PRN Ursula Alert, MD   5 mg at 08/16/17 1147  . ondansetron (ZOFRAN-ODT) disintegrating tablet 4 mg  4 mg Oral Q6H PRN Liandra Mendia, MD      . prazosin (MINIPRESS) capsule 1 mg  1 mg Oral QHS Sabrinia Prien, MD   1 mg at 08/15/17 2134  . sertraline (ZOLOFT) tablet 50 mg  50 mg Oral Daily Ursula Alert, MD   50 mg at 08/16/17 0816  . thiamine (VITAMIN B-1) tablet 100 mg  100 mg Oral Daily Fabiana Dromgoole, Ria Clock, MD   100 mg at 08/16/17 0816  . ziprasidone (GEODON) capsule 40 mg  40 mg Oral Q supper Ursula Alert, MD        Lab Results:  Results for orders placed or performed during the hospital encounter of 08/13/17 (from the past 48 hour(s))  Hemoglobin A1c     Status: Abnormal   Collection Time: 08/15/17  6:18 AM  Result Value Ref Range   Hgb A1c MFr Bld 4.6 (L) 4.8 - 5.6 %    Comment:  (NOTE) Pre diabetes:          5.7%-6.4% Diabetes:              >6.4% Glycemic control for   <7.0% adults with diabetes    Mean Plasma Glucose 85.32 mg/dL  Comment: Performed at [facility], [place] [address] [place], [place] [address]  Lipid panel     Status: Abnormal   Collection Time: 08/15/17  6:18 AM  Result Value Ref Range   Cholesterol 215 (H) 0 - 200 mg/dL   Triglycerides 178 (H) <150 mg/dL   HDL 69 >40 mg/dL   Total CHOL/HDL Ratio 3.1 RATIO   VLDL 36 0 - 40 mg/dL   LDL Cholesterol 110 (H) 0 - 99 mg/dL    Comment:        Total Cholesterol/HDL:CHD Risk Coronary Heart Disease Risk Table                     Men   Women  1/2 Average Risk   3.4   3.3  Average Risk       5.0   4.4  2 X Average Risk   9.6   7.1  3 X Average Risk  23.4   11.0        Use the calculated Patient Ratio above and the CHD Risk Table to determine the patient's CHD Risk.        ATP III CLASSIFICATION (LDL):  <100     mg/dL   Optimal  100-129  mg/dL   Near or Above                    Optimal  130-159  mg/dL   Borderline  160-189  mg/dL   High  >190     mg/dL   Very High Performed at [facility] [address], [place], [place] [address]   TSH     Status: None   Collection Time: 08/15/17  6:18 AM  Result Value Ref Range   TSH 0.598 0.350 - 4.500 uIU/mL    Comment: Performed by a 3rd Generation assay with a functional sensitivity of <=0.01 uIU/mL. Performed at [facility] [place] [address], [place], [place] [address]   Prolactin     Status: Abnormal   Collection Time: 08/15/17  6:18 AM  Result Value Ref Range   Prolactin 172.1 (H) 4.8 - 23.3 ng/mL    Comment: (NOTE) Performed At: [facility] [place], [place] [id number] Lindon Romp MD UV:[id number] Performed at [facility], [place] [address], [place], [place] [address]     Blood Alcohol level:  Lab Results  Component Value Date   ETH <5 08/13/2017   ETH 85  (H) 56/38/7564    Metabolic Disorder Labs: Lab Results  Component Value Date   HGBA1C 4.6 (L) 08/15/2017   MPG 85.32 08/15/2017   MPG 91 01/17/2017   Lab Results  Component Value Date   PROLACTIN 172.1 (H) 08/15/2017   PROLACTIN 192.0 (H) 01/17/2017   Lab Results  Component Value Date   CHOL 215 (H) 08/15/2017   TRIG 178 (H) 08/15/2017   HDL 69 08/15/2017   CHOLHDL 3.1 08/15/2017   VLDL 36 08/15/2017   LDLCALC 110 (H) 08/15/2017   LDLCALC 88 01/17/2017    Physical Findings: AIMS: Facial and Oral Movements Muscles of Facial Expression: None, normal Lips and Perioral Area: None, normal Jaw: None, normal Tongue: None, normal,Extremity Movements Upper (arms, wrists, hands, fingers): None, normal Lower (legs, knees, ankles, toes): None, normal, Trunk Movements Neck, shoulders, hips: None, normal, Overall Severity Severity of abnormal movements (highest score from questions above): None, normal Incapacitation due to abnormal movements: None, normal Patient's awareness of  abnormal movements (rate only patient's report): No Awareness, Dental Status Current problems with teeth and/or dentures?: No Does patient usually wear dentures?: No  CIWA:  CIWA-Ar Total: 1 COWS:     Musculoskeletal: Strength & Muscle Tone: within normal limits Gait & Station: normal Patient leans: N/A  Psychiatric Specialty Exam: Physical Exam  Nursing note and vitals reviewed.   Review of Systems  Psychiatric/Behavioral: Positive for depression. The patient is nervous/anxious.   All other systems reviewed and are negative.    Blood pressure 124/65, pulse (!) 107, temperature 98.1 F (36.7 C), temperature source Oral, resp. rate 18, height 5\' 1"  (1.549 m), weight 87.1 kg (192 lb).Body mass index is 36.28 kg/m.  General Appearance: Casual  Eye Contact:  Fair  Speech:  Normal Rate  Volume:  Normal  Mood: depressed  Affect: sad  Thought Process:  Linear and Descriptions of Associations:  Intact  Orientation:  Full (Time, Place, and Person)  Thought Content:rumination, racing thoughts positive  Suicidal Thoughts:  No   Homicidal Thoughts:  No  Memory:  Immediate;   Fair Recent;   Fair Remote;   Fair  Judgement:  Fair  Insight:  Fair  Psychomotor Activity:  Normal  Concentration:  Concentration: Fair and Attention Span: Fair  Recall:  [employer] of Knowledge:  Fair  Language:  Fair  Akathisia:  NA  Handed:  Right  AIMS (if indicated):     Assets:  Physical Health Social Support  ADL's:  Intact  Cognition:  WNL  Sleep:  Number of Hours: 6.75   Assessment - Patient is a 37 y old CF , who has a hx of PTSD, Schizoaffective do , as well as BZD dependence , presented very depressed and anxious with rambling speech , suspected OD on her medications . Her UDS was negative , but she was prescribed ( as per NCDRS) excessive amount of xanax , dexmethylphenidate . Her Ambien and xanax bottles were empty per EMS - filled 8/24 ( #30) , filled 8/7 ( #90) respectively. Pt likely with misuse /abuse of these medications. Pt with elevated PL level per review of labs - likely due to [facility] . Continues to need support.    Treatment Plan Summary: Daily contact with patient to assess and evaluate symptoms and progress in treatment, Medication management and Plan see below  Will discontinue Invega for ADRs. Will start Geodon 40 mg po daily with supper for mood sx/psychosis. Labs reviewed - Prolactin elevated , likely due to [facility]. Cogentin 0.5 mg po daily for EPS. Zoloft 50 mg po daily for anxiety/ptsd sx. Add Lamictal 25 mg Po daily for mood lability. CIWA/Ativan for withdrawal sx - BZD. Prazosin 1 mg po qhs for nightmares. Remeron 7.5 mg po qhs for insomnia. CSW will continue to work on disposition.   Gorje Iyer, MD 08/16/2017, 2:41 PM   Patient ID: [name], female   DOB: [birth year], 51 y.o.   MRN: [id number]

## 2017-08-16 NOTE — Progress Notes (Signed)
DAR NOTE: Pt present with flat affect and depressed mood in the unit. Pt has been observed in the day room interacting with peers. Pt attended group and participated.  Pt denies physical pain, took all her meds as scheduled. As per self inventory, pt had a fair night sleep, fair appetite, low energy, and poor concentration. Pt rate depression at 10, hopeless ness at 7, and anxiety at 10. Pt's safety ensured with 15 minute and environmental checks. Pt currently denies SI/HI and A/V hallucinations. Pt verbally agrees to seek staff if SI/HI or A/VH occurs and to consult with staff before acting on these thoughts. Will continue POC.

## 2017-08-16 NOTE — Progress Notes (Signed)
Recreation Therapy Notes  Date: 08/16/17 Time: 1000 Location: 500 Hall Dayroom  Group Topic: Self-Esteem  Goal Area(s) Addresses:  Patient will successfully identify positive attributes about themselves.  Patient will successfully identify benefit of improved self-esteem.   Behavioral Response: Minimal  Intervention: Markers, colored pencils, blank crest  Activity: Crest of Arms.  Patients were given a blank crest divided into four sections.  Patients were to highlight things that were important to them or that had special meaning to them such as biggest accomplishment, proudest moment, favorite activity, something I'm good at, etc.  Education:  Self-Esteem, Dentist.   Education Outcome: Acknowledges education/In group clarification offered/Needs additional education  Clinical Observations/Feedback: Pt stated her biggest accomplishment is her daughter, proudest moment is being a mother, favorite feature is showing compassion and learning patience.  Pt stated focusing on her self esteem makes her feel down because "I haven't done enough".  LRT encouraged pt to focus on the quality of the things she has done instead of the amount because at the end of the day, the impact of those things are way more important than the quantity of things she has done.  Pt was receptive to the advise.    [name], LRT/CTRS         [name] A 08/16/2017 11:34 AM

## 2017-08-17 NOTE — Progress Notes (Signed)
DAR NOTE: Pt present with bright affect and jovial mood in the unit. Pt has been in the day most of the time, dancing in the day room. Pt denies physical pain, took all her meds as scheduled. As per self inventory, pt had a good night sleep, good appetite, normal energy, and good concentration. Pt rate depression at 7, hopeless ness at 05, and anxiety at 6. Pt's safety ensured with 15 minute and environmental checks. Pt currently denies SI/HI and A/V hallucinations. Pt verbally agrees to seek staff if SI/HI or A/VH occurs and to consult with staff before acting on these thoughts. Will continue POC.

## 2017-08-17 NOTE — Progress Notes (Signed)
Recreation Therapy Notes  Date: 2/29/18 Time: 1000 Location: 500 Hall Dayroom  Group Topic: Wellness  Goal Area(s) Addresses:  Patient will define components of whole wellness. Patient will verbalize benefit of whole wellness.  Behavioral Response: Engaged  Intervention:  2 Decks of cards  Activity: Deck of Chance.  LRT had two decks of cards.  From one deck, LRT gave each patient two cards.  From the other deck, LRT would pull a card and whatever the card was the patient with the matching number had to do the exercise that corresponded with that number.  Education: Wellness, Dentist.   Education Outcome: Acknowledges education/In group clarification offered/Needs additional education.   Clinical Observations/Feedback: Pt stated wellness includes your mind, body and spirit.  Pt also stated wellness will help you connect with "your higher power".  Pt completed all her exercises and was engaged with peers.  Pt stated exercise "helps you relax your body and mind".  [name], LRT/CTRS         Ria Comment, Katira Dumais A 08/17/2017 1:11 PM

## 2017-08-17 NOTE — [facility] Group Notes (Signed)
LCSW Group Therapy Note   08/17/2017 1:15pm   Type of Therapy and Topic:  Group Therapy:  Overcoming Obstacles   Participation Level:  Active   Description of Group:    In this group patients will be encouraged to explore what they see as obstacles to their own wellness and recovery. They will be guided to discuss their thoughts, feelings, and behaviors related to these obstacles. The group will process together ways to cope with barriers, with attention given to specific choices patients can make. Each patient will be challenged to identify changes they are motivated to make in order to overcome their obstacles. This group will be process-oriented, with patients participating in exploration of their own experiences as well as giving and receiving support and challenge from other group members.   Therapeutic Goals: 1. Patient will identify personal and current obstacles as they relate to admission. 2. Patient will identify barriers that currently interfere with their wellness or overcoming obstacles.  3. Patient will identify feelings, thought process and behaviors related to these barriers. 4. Patient will identify two changes they are willing to make to overcome these obstacles:      Summary of Patient Progress   My biggest obstacle is my home situation.  I put my daughter and her boyfriend first, and that's what ended me up in the hospital.  But when I go home, I don't want to turn to pills, I want clarity when I am working through this."  Got lots of feedback from others about how to deal with the situation.  She was willing to listen to all without dismissing them, but was still not able to identify any steps she would take.  "They both got jobs since I came to the hospital.  I think it will be OK."   Therapeutic Modalities:   Cognitive Behavioral Therapy Solution Focused Therapy Motivational Interviewing Relapse Prevention [place], [place] 08/17/2017 4:07 PM

## 2017-08-17 NOTE — Progress Notes (Signed)
Patient attended group and said that her day was a 5.  Patient said her coping skills for today was positive thinking.

## 2017-08-17 NOTE — Progress Notes (Signed)
[facility] MD Progress Note  08/17/2017 3:50 PM Deborah Pena  MRN:  [id number] Subjective:Patient states " I feel better."     Objective :Patient seen and chart reviewed.Discussed patient with treatment team.  Pt today seen as less anxious , her affect is brighter. Pt reports withdrawal sx have improved . Pt reports she was able to talk to her daughter and it looks like her daughter has finally started looking for a job . That is a releif for her . She continues to want to be supportive to her daughter but at the same time does not like a lot of behaviors her daughter exhibits and her boyfriend also is a stressor. Continue to support.        Principal Problem: Schizoaffective disorder, depressive type ([place]) Diagnosis:   Patient Active Problem List   Diagnosis Date Noted  . 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 ADD (attention deficit disorder) [F98.8] 08/14/2017  . Severe benzodiazepine use disorder ([place]) [F13.20] 08/14/2017  . Alcohol abuse [F10.10] 03/13/2017  . Drug overdose [T50.901A]   . Schizoaffective disorder, depressive type ([place]) [F25.1] 01/16/2017  . PTSD (post-traumatic stress disorder) [F43.10] 01/16/2017  . Panic disorder [F41.0] 01/16/2017  . Acute diverticulitis [K57.92] 04/26/2016  . Diverticulitis of large intestine with abscess without bleeding [K57.20] 04/26/2016  . Intractable migraine [G43.919] 07/27/2014  . Insomnia [G47.00] 05/26/2013  . Adjustment disorder with anxious mood [F43.22] 12/06/2012  . 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 Anemia   . Anginal pain ([place])   . Anxiety   . Arthritis    "left hand" (04/26/2016)  . Asthma   . Chronic lower back pain   . Diverticulitis of colon   . GERD (gastroesophageal reflux disease)   . Headache    'once or twice/week" (04/26/2016)  . Migraine    "twice/month"  (04/26/2016)  . Stomach ulcer     Past Surgical History:  Procedure Laterality Date  . ABDOMINAL HYSTERECTOMY  2013   preformed at [facility] by Dr Jacqualyn Posey in April  . BREAST SURGERY Bilateral ~ 2000   "dual lateral duct removal"   . BUNIONECTOMY Right 2013  . CESAREAN SECTION  1999  . OOPHORECTOMY Right 2013  . TONSILLECTOMY     Family History:  Family History  Problem Relation Age of Onset  . Heart failure Father   . Diabetes Other   . Breast cancer Other   . Diverticulitis Other   . Mental illness Paternal Grandmother   . Mental illness Paternal Grandfather   . Schizophrenia Paternal Aunt   . Suicidality Maternal Aunt    Social History:  History  Alcohol Use No     History  Drug Use No    Social History   Social History  . Marital status: Married    Spouse name: N/A  . Number of children: N/A  . Years of education: N/A   Social History Main Topics  . Smoking status: Former Smoker    Packs/day: 2.00    Years: 30.00    Types: Cigarettes  . Smokeless tobacco: Former Systems developer    Types: Chew     Comment: "quit smoking cigarettes in ~ 2011; chewed when I was little"  . Alcohol use No  . Drug use: No  . Sexual activity: Not Currently   Other Topics Concern  . None   Social History Narrative  .  None   Additional Social History:   Sleep: Fair  Appetite:  Fair  Current Medications: Current Facility-Administered Medications  Medication Dose Route Frequency Provider Last Rate Last Dose  . acetaminophen (TYLENOL) tablet 650 mg  650 mg Oral Q6H PRN Okonkwo, Justina A, NP   650 mg at 08/16/17 1147  . alum & mag hydroxide-simeth (MAALOX/MYLANTA) 200-200-20 MG/5ML suspension 30 mL  30 mL Oral Q4H PRN Okonkwo, Justina A, NP      . benztropine (COGENTIN) tablet 0.5 mg  0.5 mg Oral Daily Starkes, Takia S, FNP   0.5 mg at 08/17/17 0813  . ibuprofen (ADVIL,MOTRIN) tablet 400 mg  400 mg Oral Q6H PRN Lindon Romp A, NP   400 mg at 08/14/17 1214  . lamoTRIgine (LAMICTAL)  tablet 25 mg  25 mg Oral Daily Ursula Alert, MD   25 mg at 08/17/17 0813  . magnesium hydroxide (MILK OF MAGNESIA) suspension 30 mL  30 mL Oral Daily PRN Okonkwo, Justina A, NP      . mirtazapine (REMERON) tablet 7.5 mg  7.5 mg Oral QHS Priscille Loveless S, FNP   7.5 mg at 08/15/17 2134  . multivitamin with minerals tablet 1 tablet  1 tablet Oral Daily Ursula Alert, MD   1 tablet at 08/17/17 0813  . nicotine (NICODERM CQ - dosed in mg/24 hours) patch 21 mg  21 mg Transdermal Daily Bowyn Mercier, Ria Clock, MD   21 mg at 08/17/17 0815  . OLANZapine zydis (ZYPREXA) disintegrating tablet 5 mg  5 mg Oral BID PRN Ursula Alert, MD   5 mg at 08/17/17 1254  . prazosin (MINIPRESS) capsule 1 mg  1 mg Oral QHS Ankita Newcomer, MD   1 mg at 08/15/17 2134  . sertraline (ZOLOFT) tablet 50 mg  50 mg Oral Daily Jontrell Bushong, Ria Clock, MD   50 mg at 08/17/17 0813  . thiamine (VITAMIN B-1) tablet 100 mg  100 mg Oral Daily Kahlin Mark, MD   100 mg at 08/17/17 0813  . ziprasidone (GEODON) capsule 40 mg  40 mg Oral Q supper Ursula Alert, MD   40 mg at 08/16/17 1646    Lab Results:  No results found for this or any previous visit (from the past 48 hour(s)).  Blood Alcohol level:  Lab Results  Component Value Date   ETH <5 08/13/2017   ETH 85 (H) 92/10/9416    Metabolic Disorder Labs: Lab Results  Component Value Date   HGBA1C 4.6 (L) 08/15/2017   MPG 85.32 08/15/2017   MPG 91 01/17/2017   Lab Results  Component Value Date   PROLACTIN 172.1 (H) 08/15/2017   PROLACTIN 192.0 (H) 01/17/2017   Lab Results  Component Value Date   CHOL 215 (H) 08/15/2017   TRIG 178 (H) 08/15/2017   HDL 69 08/15/2017   CHOLHDL 3.1 08/15/2017   VLDL 36 08/15/2017   LDLCALC 110 (H) 08/15/2017   LDLCALC 88 01/17/2017    Physical Findings: AIMS: Facial and Oral Movements Muscles of Facial Expression: None, normal Lips and Perioral Area: None, normal Jaw: None, normal Tongue: None, normal,Extremity Movements Upper  (arms, wrists, hands, fingers): None, normal Lower (legs, knees, ankles, toes): None, normal, Trunk Movements Neck, shoulders, hips: None, normal, Overall Severity Severity of abnormal movements (highest score from questions above): None, normal Incapacitation due to abnormal movements: None, normal Patient's awareness of abnormal movements (rate only patient's report): No Awareness, Dental Status Current problems with teeth and/or dentures?: No Does patient usually wear dentures?: No  CIWA:  CIWA-Ar  Total: 2 COWS:     Musculoskeletal: Strength & Muscle Tone: within normal limits Gait & Station: normal Patient leans: N/A  Psychiatric Specialty Exam: Physical Exam  Nursing note and vitals reviewed.   Review of Systems  Psychiatric/Behavioral: Positive for depression. The patient is nervous/anxious.   All other systems reviewed and are negative.    Blood pressure 126/87, pulse (!) 106, temperature 99 F (37.2 C), temperature source Oral, resp. rate 20, height 5\' 1"  (1.549 m), weight 87.1 kg (192 lb).Body mass index is 36.28 kg/m.  General Appearance: Casual  Eye Contact:  Fair  Speech:  Normal Rate  Volume:  Normal  Mood: depressed, improving  Affect: sad  Thought Process:  Linear and Descriptions of Associations: Intact  Orientation:  Full (Time, Place, and Person)  Thought Content:racing thoughts improving, rumination is improvinng  Suicidal Thoughts:  No   Homicidal Thoughts:  No  Memory:  Immediate;   Fair Recent;   Fair Remote;   Fair  Judgement:  Fair  Insight:  Fair  Psychomotor Activity:  Normal  Concentration:  Concentration: Fair and Attention Span: Fair  Recall:  [employer] of Knowledge:  Fair  Language:  Fair  Akathisia:  NA  Handed:  Right  AIMS (if indicated):     Assets:  Physical Health Social Support  ADL's:  Intact  Cognition:  WNL  Sleep:  Number of Hours: 6.75   Will continue today 08/17/17  plan as below except where it is  noted.    Assessment - Pt with PTSD , schizoaffective do as well as BZD dependence , presented as depressed with rambling speech and suspected OD on medications. Pt continues to improve , continue treatment.Possible discharge Thursday if she continues to improve.  08/16/17 Her UDS was negative , but she was prescribed ( as per [place]CDRS) excessive amount of xanax , dexmethylphenidate . Her Ambien and xanax bottles were empty per EMS - filled 8/24 ( #30) , filled 8/7 ( #90) respectively. Pt likely with misuse /abuse of these medications.   Treatment Plan Summary: Daily contact with patient to assess and evaluate symptoms and progress in treatment, Medication management and Plan see below  Will continue Geodon 40 mg po daily with supper for mood sx/psychosis. Labs reviewed - Prolactin elevated , likely due to [facility]. Cogentin 0.5 mg po daily for EPS. Zoloft 50 mg po daily for anxiety/ptsd sx. Added  Lamictal 25 mg Po daily for mood lability. CIWA/Ativan for withdrawal sx - BZD. Prazosin 1 mg po qhs for nightmares. Remeron 7.5 mg po qhs for insomnia. CSW will continue to work on disposition.Letter sent to outpatient provider recommending not to prescribe medications like BZD for this patient due to possible abuse potential, her hx and recent admission.    Darus Hershman, MD 08/17/2017, 3:50 PM   Patient ID: Deborah Pena, female   DOB: April 10, 1966, 51 y.o.   MRN: [id number]

## 2017-08-18 DIAGNOSIS — F1320 Sedative, hypnotic or anxiolytic dependence, uncomplicated: Secondary | ICD-10-CM

## 2017-08-18 DIAGNOSIS — Z87891 Personal history of nicotine dependence: Secondary | ICD-10-CM

## 2017-08-18 DIAGNOSIS — Z630 Problems in relationship with spouse or partner: Secondary | ICD-10-CM

## 2017-08-18 DIAGNOSIS — E221 Hyperprolactinemia: Secondary | ICD-10-CM

## 2017-08-18 DIAGNOSIS — F988 Other specified behavioral and emotional disorders with onset usually occurring in childhood and adolescence: Secondary | ICD-10-CM

## 2017-08-18 DIAGNOSIS — F1910 Other psychoactive substance abuse, uncomplicated: Secondary | ICD-10-CM

## 2017-08-18 DIAGNOSIS — F515 Nightmare disorder: Secondary | ICD-10-CM

## 2017-08-18 DIAGNOSIS — Z6379 Other stressful life events affecting family and household: Secondary | ICD-10-CM

## 2017-08-18 MED ORDER — IBUPROFEN 200 MG PO TABS: 400.0000 mg | ORAL_TABLET | Freq: Four times a day (QID) | ORAL | 0 refills | Status: DC | PRN

## 2017-08-18 MED ORDER — ZIPRASIDONE HCL 40 MG PO CAPS: 40.0000 mg | ORAL_CAPSULE | Freq: Every day | ORAL | 0 refills | Status: DC

## 2017-08-18 MED ORDER — PRAZOSIN HCL 1 MG PO CAPS: 1.0000 mg | ORAL_CAPSULE | Freq: Every day | ORAL | 0 refills | Status: DC

## 2017-08-18 MED ORDER — ADULT MULTIVITAMIN W/MINERALS CH: 1.0000 | ORAL_TABLET | Freq: Every day | ORAL | Status: DC

## 2017-08-18 MED ORDER — ACETAMINOPHEN 325 MG PO TABS: 650.0000 mg | ORAL_TABLET | Freq: Four times a day (QID) | ORAL | Status: DC | PRN

## 2017-08-18 MED ORDER — NICOTINE 21 MG/24HR TD PT24: 21.0000 mg | MEDICATED_PATCH | Freq: Every day | TRANSDERMAL | 0 refills | Status: DC

## 2017-08-18 MED ORDER — BENZTROPINE MESYLATE 0.5 MG PO TABS: 0.5000 mg | ORAL_TABLET | Freq: Every day | ORAL | 0 refills | Status: DC

## 2017-08-18 MED ORDER — LAMOTRIGINE 25 MG PO TABS: 25.0000 mg | ORAL_TABLET | Freq: Every day | ORAL | 0 refills | Status: DC

## 2017-08-18 MED ORDER — THIAMINE HCL 100 MG PO TABS: 100.0000 mg | ORAL_TABLET | Freq: Every day | ORAL | 0 refills | Status: DC

## 2017-08-18 MED ORDER — MIRTAZAPINE 7.5 MG PO TABS: 7.5000 mg | ORAL_TABLET | Freq: Every day | ORAL | 0 refills | Status: DC

## 2017-08-18 MED ORDER — SERTRALINE HCL 50 MG PO TABS: 50.0000 mg | ORAL_TABLET | Freq: Every day | ORAL | 0 refills | Status: DC

## 2017-08-18 NOTE — Progress Notes (Signed)
Recreation Therapy Notes  Date: 08/18/17 Time: 1000 Location: 500 Hall Dayroom  Group Topic: Communication, Team Building, Problem Solving  Goal Area(s) Addresses:  Patient will effectively work with peer towards shared goal.  Patient will identify skill used to make activity successful.  Patient will identify how skills used during activity can be used to reach post d/c goals.   Behavioral Response: Engaged  Intervention: STEM Activity   Activity: [employer]. Patients were provided the following materials: 5 drinking straws, 5 rubber bands, 5 paper clips, 2 index cards, 2 drinking cups, and 2 toilet paper rolls. Using the provided materials patients were asked to build a launching mechanisms to launch a ping pong ball approximately 12 feet. Patients were divided into teams of 3-5.   Education: Education officer, community, Dentist.   Education Outcome: Acknowledges education/In group clarification offered/Needs additional education.   Clinical Observations/Feedback: Pt was engaged at first and was working well with her peers.  Pt became frustrated and left group.  Pt later returned to see what the groups were able to come up with.      [name], LRT/CTRS        [name] A 08/18/2017 12:23 PM

## 2017-08-18 NOTE — Progress Notes (Signed)
  D: When asked about her day, pt stated, "I didn't in touch with my daughter". Pt explained that her daughter and her daughter's boyfriend live with her. Stated, the reason she's in [facility] is because of her daughter's boyfriend. Stated, "I don't like him".  Stated "things are gonna be different this time".  Pt has no questions or concerns.    A:  Support and encouragement was offered. 15 min checks continued for safety.  R: Pt remains safe.

## 2017-08-18 NOTE — Progress Notes (Signed)
  [facility] Adult Case Management Discharge Plan :  Will you be returning to the same living situation after discharge:  Yes,  home At discharge, do you have transportation home?: Yes,  walking Do you have the ability to pay for your medications: Yes,  insurance  Release of information consent forms completed and in the chart;  Patient's signature needed at discharge.  Patient to Follow up at: [place], [facility] Follow up on 09/01/2017.   Specialty:  Behavioral Health Why:  Thursday at 12:00 with Noemi Chapel, PA  the number for our [place] clinic is [phone]. Contact information: [address] [place] [address] [phone]           Next level of care provider has access to [place] and Suicide Prevention discussed: Yes,  yes  Have you used any form of tobacco in the last 30 days? (Cigarettes, Smokeless Tobacco, Cigars, and/or Pipes): Yes  Has patient been referred to the Quitline?: Patient refused referral  Patient has been referred for addiction treatment: [place], LCSW 08/18/2017, 9:50 AM

## 2017-08-18 NOTE — Discharge Summary (Signed)
Physician Discharge Summary Note  Patient:  Deborah Pena is an 51 y.o., female MRN:  [id number] DOB:  Jul 29, 1966 Patient phone:  [phone] (home)  Patient address:   [address] [place] [address],  Total Time spent with patient: Greater than 30 minutes  Date of Admission:  08/13/2017 Date of Discharge: 08-18-17  Reason for Admission: Worsening symptoms of Schizoaffective disorder.  Principal Problem: Schizoaffective disorder, depressive type [facility])  Discharge Diagnoses: Patient Active Problem List   Diagnosis Date Noted  . Hyperprolactinemia ([place]) [E22.1] 08/16/2017  . ADD (attention deficit disorder) [F98.8] 08/14/2017  . Severe benzodiazepine use disorder ([place]) [F13.20] 08/14/2017  . Alcohol abuse [F10.10] 03/13/2017  . Drug overdose [T50.901A]   . Schizoaffective disorder, depressive type ([place]) [F25.1] 01/16/2017  . PTSD (post-traumatic stress disorder) [F43.10] 01/16/2017  . Panic disorder [F41.0] 01/16/2017  . Acute diverticulitis [K57.92] 04/26/2016  . Diverticulitis of large intestine with abscess without bleeding [K57.20] 04/26/2016  . Intractable migraine [G43.919] 07/27/2014  . Insomnia [G47.00] 05/26/2013  . Adjustment disorder with anxious mood [F43.22] 12/06/2012  . 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 Anemia   . Anginal pain ([place])   . Anxiety   . Arthritis    "left hand" (04/26/2016)  . Asthma   . Chronic lower back pain   . Diverticulitis of colon   . GERD (gastroesophageal reflux disease)   . Headache    'once or twice/week" (04/26/2016)  . Migraine    "twice/month" (04/26/2016)  . Stomach ulcer     Past Surgical History:  Procedure Laterality Date  . ABDOMINAL HYSTERECTOMY  2013   preformed at [facility] by Dr Jacqualyn Posey in April  . BREAST SURGERY Bilateral ~ 2000    "dual lateral duct removal"   . BUNIONECTOMY Right 2013  . CESAREAN SECTION  1999  . OOPHORECTOMY Right 2013  . TONSILLECTOMY     Family History:  Family History  Problem Relation Age of Onset  . Heart failure Father   . Diabetes Other   . Breast cancer Other   . Diverticulitis Other   . Mental illness Paternal Grandmother   . Mental illness Paternal Grandfather   . Schizophrenia Paternal Aunt   . Suicidality Maternal Aunt    Family Psychiatric  History: See H&P  Social History:  History  Alcohol Use No     History  Drug Use No    Social History   Social History  . Marital status: Married    Spouse name: N/A  . Number of children: N/A  . Years of education: N/A   Social History Main Topics  . Smoking status: Former Smoker    Packs/day: 2.00    Years: 30.00    Types: Cigarettes  . Smokeless tobacco: Former Systems developer    Types: Chew     Comment: "quit smoking cigarettes in ~ 2011; chewed when I was little"  . Alcohol use No  . Drug use: No  . Sexual activity: Not Currently   Other Topics Concern  . None   Social History Narrative  . None   Hospital Course: Deborah Pena is a 51 year old female who is separated from husband for 6 months. She is a Programmer, systems, but, unemployed. She resides in a home with her 97 year old daughter and her daughter's boyfriend. She has (1) brother who lives  in [place]. Her biological mother and stepfather are alive.  During this admission assessment, Suvi reports, "I felt like I was going into a really deep depression. I dont even know how I got here. I didn't want to kill myself. My boyfriend's daughter is stressing me out and triggering me. I have asked him to leave many times but he won't. He has a spell on her. He controls her everything. She is 49 years old but acts 63. I think I took about 12(1mg ) Xanax about 30 pills all together over 2 days to go to sleep but not to sleep forever.   Upon her admision to the [facility] adult unit, Deborah Pena  was evaluated & her presenting symptoms identified. Her admission lab results (both toxicology & UDS) were all negative of all substances. However, she was in need of mood stabilization treatments. After evaluation of her presenting symptoms, Deborah Pena was started & discharged on; Cogentin 1 mg for EPS, Lamictal 25 mg for mood stabilization, Mirtazapine 7.5 mg for insomnia, Nicotine patch 21 mg for nicotine withdrawal symptoms, Prazosin 1 mg for nightmares,  Latuda 40 mg for mood control & Sertraline 50 mg for depression. She was enrolled & encouraged to participate in the group counseling sessions being offered & held on this unit.  she did & learned coping skills. She presented other significant health issues that required treatment or monitoring. She was resumed & discharged on the medication for the health issues. She tolerated her treatment regimen without any adverse effects or reactions.  During the course of her hospitalization, Deborah Pena was evaluated on daily basis by the clinical providers to assure her response to her treatment regimen.As her treatment progressed, improvement was noted as evidenced by her report of decreasing symptoms, improved mood, presentation of good affect, medication tolerance & active participation in the unit programming.She was encouraged to update her providers on her progress by daily completion of a self-inventory assessment, noting mood, mental status, any new symptoms, anxiety & or concerns.  Deborah Pena's symptoms responded well to her treatment regimen combined with a therapeutic and supportive environment. Upon her hospital discharge, Deborah Pena was in much improved condition than upon admission.Her symptoms were reported as significantly decreased or resolved completely. She adamantly denies any SI/HI,  AVH, delusional thoughts & or paranoia. She was motivated to continue taking medication with a goal of continued improvement in mental health. She will continue psychiatric care on  an outpatient basis as noted below. She is provided with all the necessary information required to make this appointment without problems.  She left [facility] with all personal belongings in no apparent distress. Transportation per self. She walked home as her home is very close to the hospital.  Physical Findings: AIMS: Facial and Oral Movements Muscles of Facial Expression: None, normal Lips and Perioral Area: None, normal Jaw: None, normal Tongue: None, normal,Extremity Movements Upper (arms, wrists, hands, fingers): None, normal Lower (legs, knees, ankles, toes): None, normal, Trunk Movements Neck, shoulders, hips: None, normal, Overall Severity Severity of abnormal movements (highest score from questions above): None, normal Incapacitation due to abnormal movements: None, normal Patient's awareness of abnormal movements (rate only patient's report): No Awareness, Dental Status Current problems with teeth and/or dentures?: No Does patient usually wear dentures?: No  CIWA:  CIWA-Ar Total: 2 COWS:     Musculoskeletal: Strength & Muscle Tone: within normal limits Gait & Station: normal Patient leans: N/A  Psychiatric Specialty Exam: Physical Exam  Constitutional: She appears well-developed.  HENT:  Head: Normocephalic.  Eyes:  Pupils are equal, round, and reactive to light.  Neck: Normal range of motion.  Cardiovascular: Normal rate.   Respiratory: Effort normal.  GI: Soft.  Genitourinary:  Genitourinary Comments: Denies any issues in this area  Musculoskeletal: Normal range of motion.  Neurological: She is alert.  Skin: Skin is warm.    Review of Systems  Constitutional: Negative.   HENT: Negative.   Eyes: Negative.   Respiratory: Negative.   Cardiovascular: Negative.   Gastrointestinal: Negative.   Genitourinary: Negative.   Musculoskeletal: Negative.   Skin: Negative.   Neurological: Negative.   Endo/Heme/Allergies: Negative.   Psychiatric/Behavioral: Positive for  depression (Stable) and substance abuse (Hx. Alcohol, Amphetamine/Benzodiazepine use disorder. ). Negative for hallucinations, memory loss and suicidal ideas. The patient has insomnia (Stable). The patient is not nervous/anxious.     Blood pressure (!) 131/92, pulse 92, temperature 98.9 F (37.2 C), temperature source Oral, resp. rate 18, height 5\' 1"  (1.549 m), weight 87.1 kg (192 lb).Body mass index is 36.28 kg/m.  See Md's SRA  Have you used any form of tobacco in the last 30 days? (Cigarettes, Smokeless Tobacco, Cigars, and/or Pipes): Yes  Has this patient used any form of tobacco in the last 30 days? (Cigarettes, Smokeless Tobacco, Cigars, and/or Pipes): Yes, provided with a nicotine patch prescription 21 mg Q 24 hours for smoking cessation.  Blood Alcohol level:  Lab Results  Component Value Date   ETH <5 08/13/2017   ETH 85 (H) 54/08/8118   Metabolic Disorder Labs:  Lab Results  Component Value Date   HGBA1C 4.6 (L) 08/15/2017   MPG 85.32 08/15/2017   MPG 91 01/17/2017   Lab Results  Component Value Date   PROLACTIN 172.1 (H) 08/15/2017   PROLACTIN 192.0 (H) 01/17/2017   Lab Results  Component Value Date   CHOL 215 (H) 08/15/2017   TRIG 178 (H) 08/15/2017   HDL 69 08/15/2017   CHOLHDL 3.1 08/15/2017   VLDL 36 08/15/2017   LDLCALC 110 (H) 08/15/2017   [place] 88 01/17/2017   See Psychiatric Specialty Exam and Suicide Risk Assessment completed by Attending Physician prior to discharge.  Discharge destination:  Home  Is patient on multiple antipsychotic therapies at discharge:  No   Has Patient had three or more failed trials of antipsychotic monotherapy by history:  No  Recommended Plan for Multiple Antipsychotic Therapies: NA  Allergies as of 08/18/2017      Reactions   Buprenorphine Hcl Other (See Comments)   "Becomes psychotic"--per notes from another healthcare network   Ciprofloxacin Itching, Other (See Comments)   Sore throat, hoarseness   Codeine  Itching, Other (See Comments)   Itches from inside out. Per pt, she can tolerate now   Propranolol Other (See Comments)   Syncope      Medication List    STOP taking these medications   alprazolam 2 MG tablet Commonly known as:  XANAX   aspirin EC 81 MG tablet   budesonide 180 MCG/ACT inhaler Commonly known as:  PULMICORT   dexmethylphenidate 10 MG tablet Commonly known as:  FOCALIN   Dexmethylphenidate HCl 40 MG Cp24   eszopiclone 1 MG Tabs tablet Commonly known as:  LUNESTA   hydrOXYzine 25 MG tablet Commonly known as:  ATARAX/VISTARIL   lurasidone 20 MG Tabs tablet Commonly known as:  LATUDA   naphazoline-glycerin 0.012-0.2 % Soln Commonly known as:  CLEAR EYES   paliperidone 6 MG 24 hr tablet Commonly known as:  INVEGA   traZODone 50 MG  tablet Commonly known as:  DESYREL     TAKE these medications     Indication  acetaminophen 325 MG tablet Commonly known as:  TYLENOL Take 2 tablets (650 mg total) by mouth every 6 (six) hours as needed. For fever/pain  Indication:  Fever, Pain   benztropine 0.5 MG tablet Commonly known as:  COGENTIN Take 1 tablet (0.5 mg total) by mouth daily. For prevention of drug induced tremors. What changed:  medication strength  how much to take  when to take this  additional instructions  Indication:  Extrapyramidal Reaction caused by Medications   ibuprofen 200 MG tablet Commonly known as:  ADVIL Take 2 tablets (400 mg total) by mouth every 6 (six) hours as needed (for headaches).  Indication:  Migraine Headache, Mild to Moderate Pain, Headaches   lamoTRIgine 25 MG tablet Commonly known as:  LAMICTAL Take 1 tablet (25 mg total) by mouth daily. For mood stabilization  Indication:  Mood stabilization   mirtazapine 7.5 MG tablet Commonly known as:  REMERON Take 1 tablet (7.5 mg total) by mouth at bedtime. For sleep  Indication:  Insomnia.   multivitamin with minerals Tabs tablet Take 1 tablet by mouth daily.  Vitamin supplement What changed:  additional instructions  Indication:  Vitamin supplement   nicotine 21 mg/24hr patch Commonly known as:  NICODERM CQ - dosed in mg/24 hours Place 1 patch (21 mg total) onto the skin daily at 6 (six) AM. For smoking cessation  Indication:  Nicotine Addiction   prazosin 1 MG capsule Commonly known as:  MINIPRESS Take 1 capsule (1 mg total) by mouth at bedtime. For PTSD related nightmares  Indication:  PTSD related nightmares   sertraline 50 MG tablet Commonly known as:  ZOLOFT Take 1 tablet (50 mg total) by mouth daily. For depression What changed:  medication strength  how much to take  Indication:  Major Depressive Disorder   thiamine 100 MG tablet Take 1 tablet (100 mg total) by mouth daily. For low thiamine replacement  Indication:  Deficiency in Thiamine or Vitamin B1   ziprasidone 40 MG capsule Commonly known as:  GEODON Take 1 capsule (40 mg total) by mouth daily with supper. For mood control  Indication:  Mood control      Follow-up [place], [facility] Follow up on 09/01/2017.   Specialty:  Behavioral Health Why:  Thursday at 12:00 with Noemi Chapel, PA  the number for our [place] clinic is [phone]. Contact information: [place] [address] [phone]          Follow-up recommendations: Activity:  As tolerated Diet: As recommended by your primary care doctor. Keep all scheduled follow-up appointments as recommended.   Comments: Patient is instructed prior to discharge to: Take all medications as prescribed by his/her mental healthcare provider. Report any adverse effects and or reactions from the medicines to his/her outpatient provider promptly. Patient has been instructed & cautioned: To not engage in alcohol and or illegal drug use while on prescription medicines. In the event of worsening symptoms, patient is instructed to call the crisis hotline,  911 and or go to the nearest ED for appropriate evaluation and treatment of symptoms. To follow-up with his/her primary care provider for your other medical issues, concerns and or health care needs.   Signed: Encarnacion Slates, NP, PMHNP, FNP-BC 08/18/2017, 10:03 AM   Patient seen for psychiatric evaluation, case discussed with the physician extender,  completed discharged suicide risk assessment and formulated safe disposition plan. Reviewed the information documented and agree with the treatment plan.  Christerpher Clos [facility] 08/18/2017 5:31 PM

## 2017-08-18 NOTE — Progress Notes (Signed)
Pt denies SI/HI and AVH, stable and appreciative at time of discharge.  All discharge paperwork, prescriptions were given and valuables returned.  Verbal understanding expressed, pt given opportunity to express concerns and ask questions.  Pt escorted to lobby upon discharge.
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
## 2017-08-18 NOTE — Progress Notes (Signed)
Recreation Therapy Notes  INPATIENT RECREATION TR PLAN  Patient Details Name: Deborah Pena MRN: [id number] DOB: 03/24/1966 Today's Date: 08/18/2017  Rec Therapy Plan Is patient appropriate for Therapeutic Recreation?: Yes Treatment times per week: about 3 days Estimated Length of Stay: 5-7 days TR Treatment/Interventions: Group participation (Comment)  Discharge Criteria Pt will be discharged from therapy if:: Discharged  Discharge Summary Progress toward goals comments: Groups attended Which groups?: Self-esteem, Wellness, Goal setting, Communication Reason goals not met: N/A Therapeutic equipment acquired: None Reason patient discharged from therapy: Discharge from hospital Pt/family agrees with progress & goals achieved: Yes Date patient discharged from therapy: 08/18/17   [name], LRT/CTRS  Ria Comment, [place] 08/18/2017, 12:33 PM

## 2017-08-18 NOTE — Tx Team (Signed)
Interdisciplinary Treatment and Diagnostic Plan Update  08/18/2017 Time of Session: 9:45 AM  Deborah Pena MRN: [id number]  Principal Diagnosis: Schizoaffective disorder, depressive type [facility])  Secondary Diagnoses: Principal Problem:   Schizoaffective disorder, depressive type ([place]) Active Problems:   PTSD (post-traumatic stress disorder)   ADD (attention deficit disorder)   Severe benzodiazepine use disorder (HCC)   Hyperprolactinemia ([place])   Current Medications:  Current Facility-Administered Medications  Medication Dose Route Frequency Provider Last Rate Last Dose  . acetaminophen (TYLENOL) tablet 650 mg  650 mg Oral Q6H PRN Okonkwo, Justina A, NP   650 mg at 08/18/17 0909  . alum & mag hydroxide-simeth (MAALOX/MYLANTA) 200-200-20 MG/5ML suspension 30 mL  30 mL Oral Q4H PRN Okonkwo, Justina A, NP      . benztropine (COGENTIN) tablet 0.5 mg  0.5 mg Oral Daily Starkes, Takia S, FNP   0.5 mg at 08/18/17 0737  . ibuprofen (ADVIL,MOTRIN) tablet 400 mg  400 mg Oral Q6H PRN Lindon Romp A, NP   400 mg at 08/14/17 1214  . lamoTRIgine (LAMICTAL) tablet 25 mg  25 mg Oral Daily Ursula Alert, MD   25 mg at 08/18/17 0737  . magnesium hydroxide (MILK OF MAGNESIA) suspension 30 mL  30 mL Oral Daily PRN Okonkwo, Justina A, NP      . mirtazapine (REMERON) tablet 7.5 mg  7.5 mg Oral QHS Priscille Loveless S, FNP   7.5 mg at 08/17/17 2041  . multivitamin with minerals tablet 1 tablet  1 tablet Oral Daily Eappen, Ria Clock, MD   1 tablet at 08/18/17 0737  . nicotine (NICODERM CQ - dosed in mg/24 hours) patch 21 mg  21 mg Transdermal Daily Eappen, Saramma, MD   21 mg at 08/18/17 0800  . OLANZapine zydis (ZYPREXA) disintegrating tablet 5 mg  5 mg Oral BID PRN Ursula Alert, MD   5 mg at 08/17/17 1254  . prazosin (MINIPRESS) capsule 1 mg  1 mg Oral QHS Eappen, Saramma, MD   1 mg at 08/17/17 2041  . sertraline (ZOLOFT) tablet 50 mg  50 mg Oral Daily Ursula Alert, MD   50 mg at 08/18/17 0737  . thiamine  (VITAMIN B-1) tablet 100 mg  100 mg Oral Daily Eappen, Saramma, MD   100 mg at 08/18/17 0737  . ziprasidone (GEODON) capsule 40 mg  40 mg Oral Q supper Ursula Alert, MD   40 mg at 08/17/17 1731    PTA Medications: Prescriptions Prior to Admission  Medication Sig Dispense Refill Last Dose  . acetaminophen (TYLENOL) 325 MG tablet Take 2 tablets (650 mg total) by mouth every 6 (six) hours as needed. For fever/pain   08/12/2017 at PRN  . budesonide (PULMICORT) 180 MCG/ACT inhaler Inhale 1 puff into the lungs 2 (two) times daily.     Marland Kitchen dexmethylphenidate (FOCALIN) 10 MG tablet Take 20 mg by mouth daily at 12 noon.     . eszopiclone (LUNESTA) 1 MG TABS tablet Take 1 mg by mouth at bedtime as needed for sleep. Take immediately before bedtime     . naphazoline-glycerin (CLEAR EYES) 0.012-0.2 % SOLN 1-2 drops 4 (four) times daily as needed for eye irritation.     . paliperidone (INVEGA) 6 MG 24 hr tablet Take 6 mg by mouth at bedtime.     Marland Kitchen alprazolam (XANAX) 2 MG tablet Take 1 mg by mouth 3 (three) times daily.    08/12/2017 at Unknown time  . aspirin EC 81 MG tablet Take 81 mg by mouth every  other day.    08/13/2017 at Unknown time  . benztropine (COGENTIN) 1 MG tablet Take 1 mg by mouth 3 (three) times daily.  1 08/13/2017 at Unknown time  . Dexmethylphenidate HCl 40 MG CP24 Take 1 capsule by mouth daily.   08/13/2017 at Unknown time  . hydrOXYzine (ATARAX/VISTARIL) 25 MG tablet Take 1 tablet (25 mg total) by mouth 3 (three) times daily as needed for anxiety. (Patient not taking: Reported on 03/04/2017) 60 tablet 0 Not Taking at Unknown time  . ibuprofen (ADVIL) 200 MG tablet Take 400 mg by mouth every 6 (six) hours as needed (for headaches).   unknown at PRN  . lurasidone (LATUDA) 20 MG TABS tablet Take 1 tablet (20 mg total) by mouth at bedtime. For mood control (Patient not taking: Reported on 03/04/2017) 30 tablet 0 Not Taking at Unknown time  . Multiple Vitamin (MULTIVITAMIN WITH MINERALS) TABS tablet  Take 1 tablet by mouth daily.   08/12/2017 at Unknown time  . nicotine (NICODERM CQ - DOSED IN MG/24 HOURS) 21 mg/24hr patch Place 1 patch (21 mg total) onto the skin daily at 6 (six) AM. For smoking cessation (Patient not taking: Reported on 03/04/2017) 28 patch 0 Not Taking at Unknown time  . sertraline (ZOLOFT) 100 MG tablet Take 1 tablet (100 mg total) by mouth daily. For depression (Patient not taking: Reported on 07/17/2017) 30 tablet 0 Not Taking at Unknown time  . traZODone (DESYREL) 50 MG tablet Take 1 tablet (50 mg total) by mouth at bedtime as needed for sleep. (Patient not taking: Reported on 07/17/2017) 30 tablet 0 Not Taking at Unknown time    Patient Stressors: Financial difficulties Health problems Marital or family conflict Occupational concerns  Patient Strengths: Capable of independent living [employer]ator fund of knowledge  Treatment Modalities: Medication Management, Group therapy, Case management,  1 to 1 session with clinician, Psychoeducation, Recreational therapy.   Physician Treatment Plan for Primary Diagnosis: Schizoaffective disorder, depressive type ([place]) Long Term Goal(s): Improvement in symptoms so as ready for discharge  Short Term Goals: Ability to identify changes in lifestyle to reduce recurrence of condition will improve Ability to verbalize feelings will improve Ability to disclose and discuss suicidal ideas Ability to demonstrate self-control will improve Compliance with prescribed medications will improve Ability to verbalize feelings will improve Ability to identify and develop effective coping behaviors will improve Ability to maintain clinical measurements within normal limits will improve Compliance with prescribed medications will improve  Medication Management: Evaluate patient's response, side effects, and tolerance of medication regimen.  Therapeutic Interventions: 1 to 1 sessions, Unit Group sessions and Medication  administration.  Evaluation of Outcomes: Adequate for Discharge  Physician Treatment Plan for Secondary Diagnosis: Principal Problem:   Schizoaffective disorder, depressive type ([place]) Active Problems:   PTSD (post-traumatic stress disorder)   ADD (attention deficit disorder)   Severe benzodiazepine use disorder ([place])   Hyperprolactinemia ([place])   Long Term Goal(s): Improvement in symptoms so as ready for discharge  Short Term Goals: Ability to identify changes in lifestyle to reduce recurrence of condition will improve Ability to verbalize feelings will improve Ability to disclose and discuss suicidal ideas Ability to demonstrate self-control will improve Compliance with prescribed medications will improve Ability to verbalize feelings will improve Ability to identify and develop effective coping behaviors will improve Ability to maintain clinical measurements within normal limits will improve Compliance with prescribed medications will improve  Medication Management: Evaluate patient's response, side effects, and tolerance of medication regimen.  Therapeutic Interventions: 1 to 1 sessions, Unit Group sessions and Medication administration.  Evaluation of Outcomes: Adequate for Discharge   RN Treatment Plan for Primary Diagnosis: Schizoaffective disorder, depressive type ([place]) Long Term Goal(s): Knowledge of disease and therapeutic regimen to maintain health will improve  Short Term Goals: Ability to disclose and discuss suicidal ideas and Ability to identify and develop effective coping behaviors will improve  Medication Management: RN will administer medications as ordered by provider, will assess and evaluate patient's response and provide education to patient for prescribed medication. RN will report any adverse and/or side effects to prescribing provider.  Therapeutic Interventions: 1 on 1 counseling sessions, Psychoeducation, Medication administration, Evaluate responses  to treatment, Monitor vital signs and CBGs as ordered, Perform/monitor CIWA, COWS, AIMS and Fall Risk screenings as ordered, Perform wound care treatments as ordered.  Evaluation of Outcomes: Progressing   LCSW Treatment Plan for Primary Diagnosis: Schizoaffective disorder, depressive type ([place]) Long Term Goal(s): Safe transition to appropriate next level of care at discharge, Engage patient in therapeutic group addressing interpersonal concerns.  Short Term Goals: Engage patient in aftercare planning with referrals and resources  Therapeutic Interventions: Assess for all discharge needs, 1 to 1 time with Social worker, Explore available resources and support systems, Assess for adequacy in community support network, Educate family and significant other(s) on suicide prevention, Complete Psychosocial Assessment, Interpersonal group therapy.  Evaluation of Outcomes: Met  Return home, follow up [facility]   Progress in Treatment: Attending groups: Yes Participating in groups: Yes Taking medication as prescribed: Yes Toleration medication: Yes, no side effects reported at this time Family/Significant other contact made: No Patient understands diagnosis: Yes AEB asking for help with depression and anxiety Discussing patient identified problems/goals with staff: Yes Medical problems stabilized or resolved: Yes Denies suicidal/homicidal ideation: Yes Issues/concerns per patient self-inventory: None Other: N/A  New problem(s) identified: None identified at this time.   New Short Term/Long Term Goal(s): "Anxiety and schizophrenia are my main problems.  It doesn't allow me to drive to work, or do much of anything.  My living situation is really stressful.  I'm living with my daughter and her boyfriend just moved in.  He has no respect for me."  Discharge Plan or Barriers:   Reason for Continuation of Hospitalization:   Estimated Length of Stay: D/C today  Attendees: Patient:   08/18/2017  9:45 AM  Physician: Ursula Alert, MD 08/18/2017  9:45 AM  Nursing: Eulogio Bear, RN 08/18/2017  9:45 AM  RN Care Manager: Lars Pinks, RN 08/18/2017  9:45 AM  Social Worker: Ripley Fraise 08/18/2017  9:45 AM  Recreational Therapist: Winfield Cunas 08/18/2017  9:45 AM  Other: Norberto Sorenson 08/18/2017  9:45 AM  Other:  08/18/2017  9:45 AM    Scribe for Treatment Team:  Roque Lias LCSW 08/18/2017 9:45 AM

## 2017-08-27 IMAGING — CT CT HEAD W/O CM
3 series · 15 of 46 positions shown, 18 images · non-contrast
Comparison: 01/21/2017 MRI head.  07/01/2016 CT head.

CLINICAL DATA: 50 y/o  F; altered mental status and headache.

EXAM:
CT HEAD WITHOUT CONTRAST
TECHNIQUE: Contiguous axial images were obtained from the base of the skull
through the vertex without intravenous contrast.

[Series 3: head 5.0 h30s · axial · 0.43mm/px · z∈[+1108,+1228]mm · 9 of 29 slices shown, 12 images]
[im 3/29  brain]
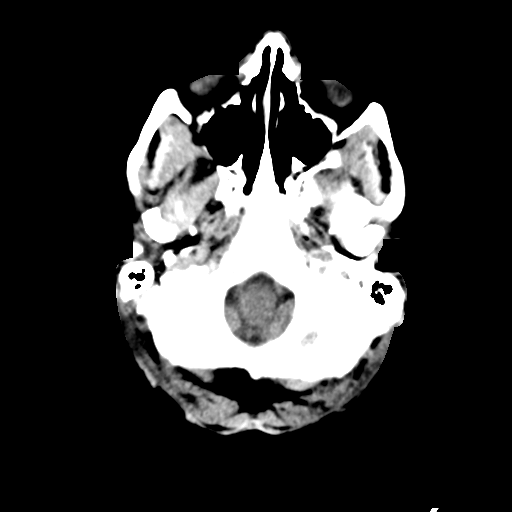
[im 3/29  bone]
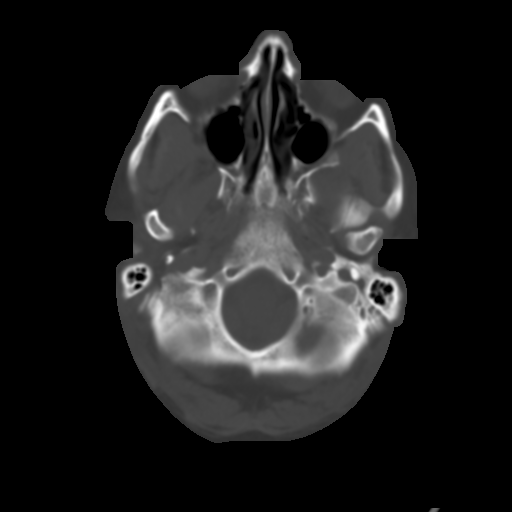
[im 6/29  brain]
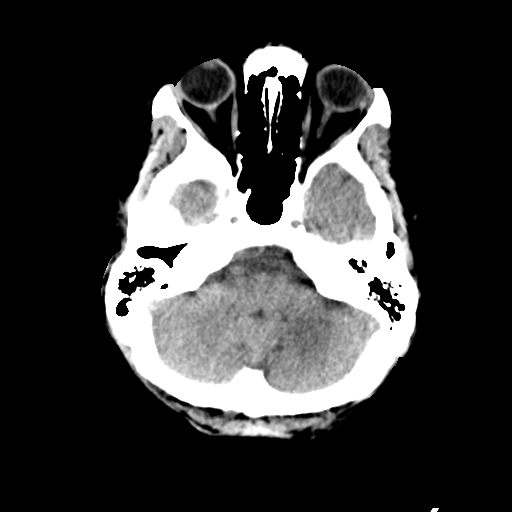
[im 9/29  brain]
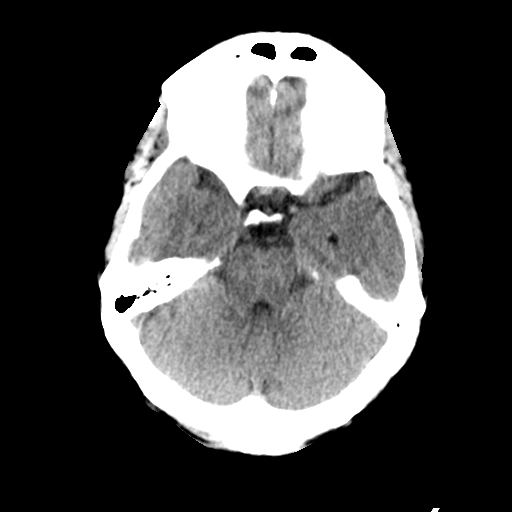
[im 12/29  brain]
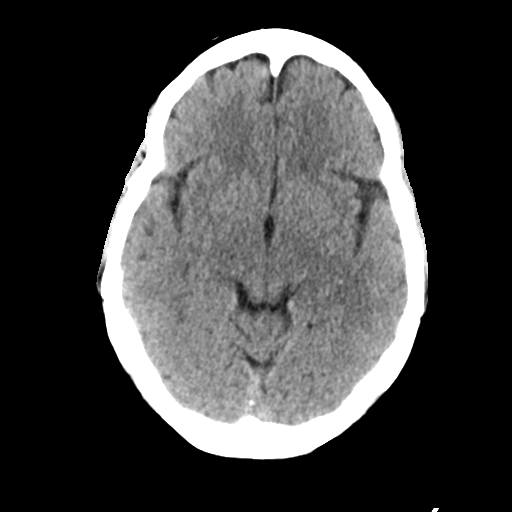
[im 15/29  brain]
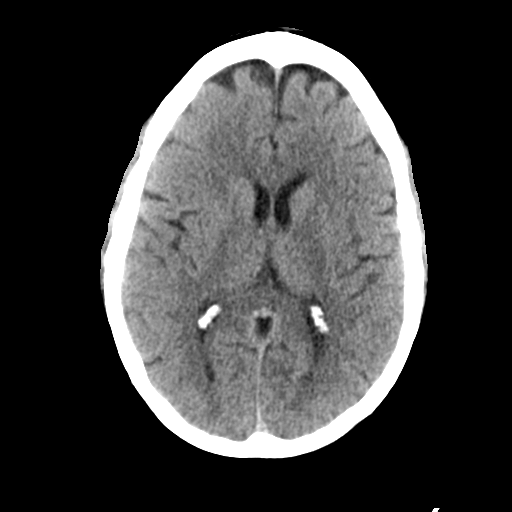
[im 15/29  bone]
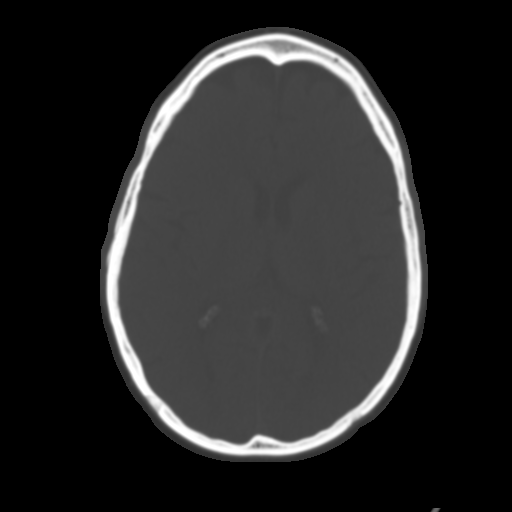
[im 18/29  brain]
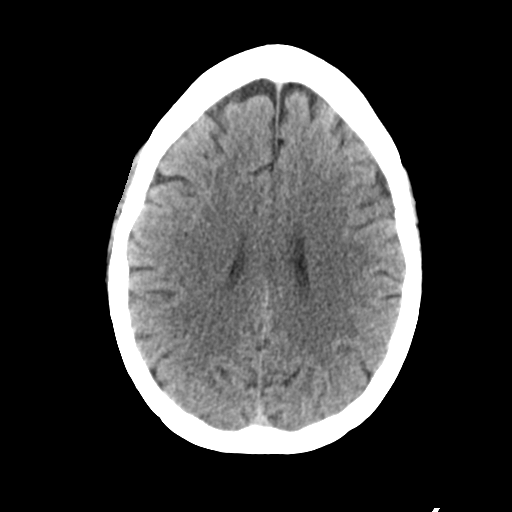
[im 21/29  brain]
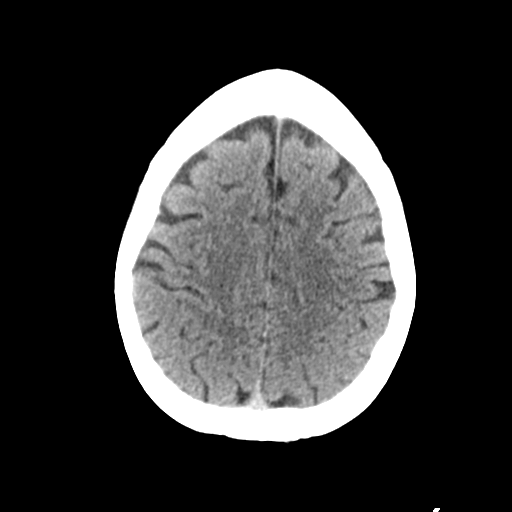
[im 24/29  brain]
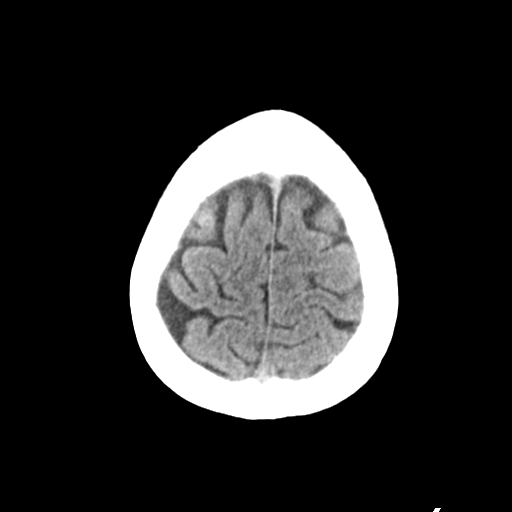
[im 27/29  brain]
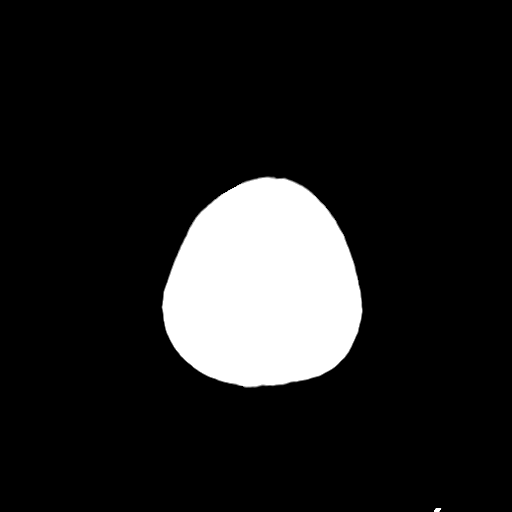
[im 27/29  bone]
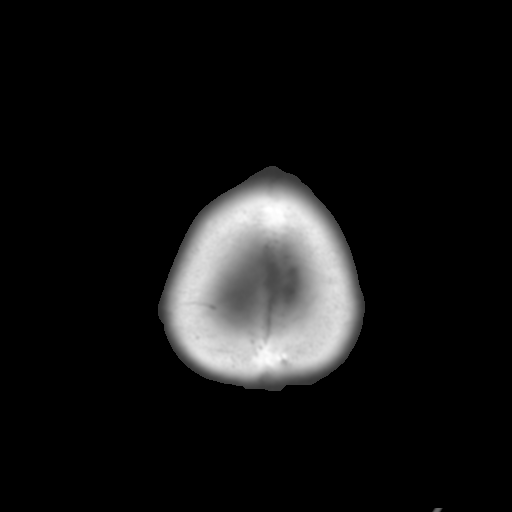

[Series 5: head 3.0 mpr cor · coronal · 0.30mm/px · 3 of 65 slices shown]
[im 22/65  brain]
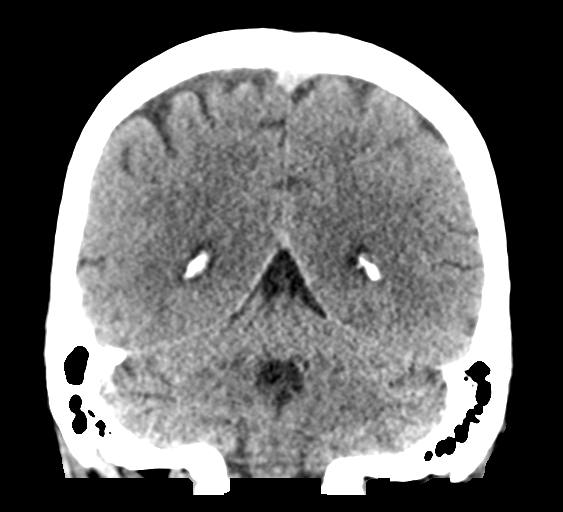
[im 29/65  brain]
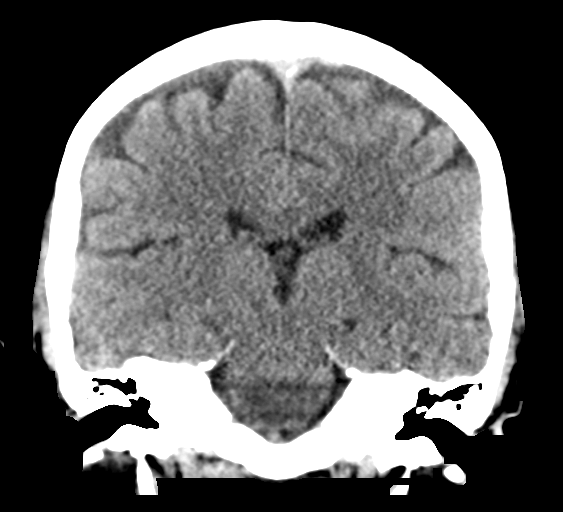
[im 36/65  brain]
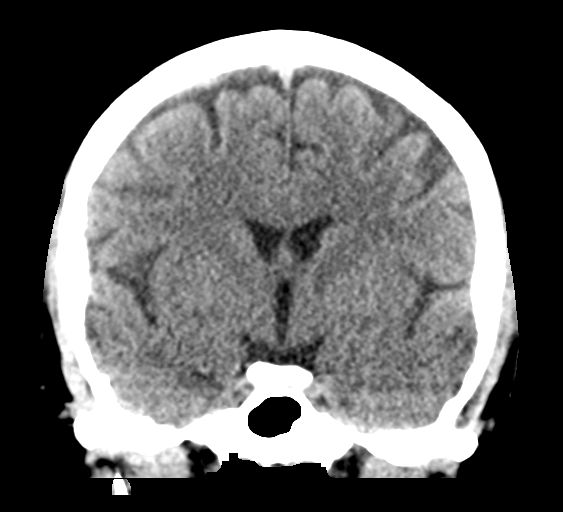

[Series 6: head 3.0 mpr sag · sagittal · 0.28mm/px · 3 of 53 slices shown]
[im 18/53  brain]
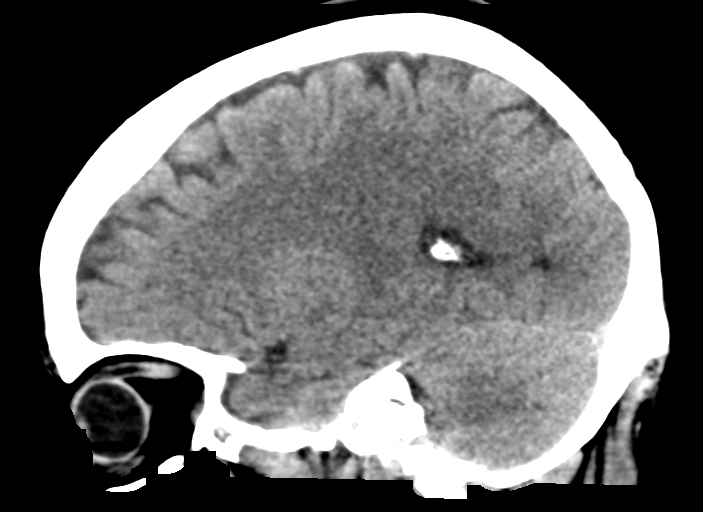
[im 27/53  brain]
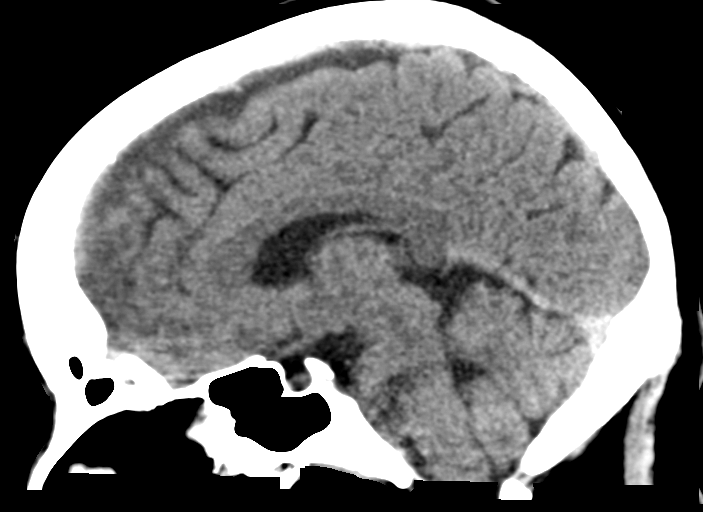
[im 35/53  brain]
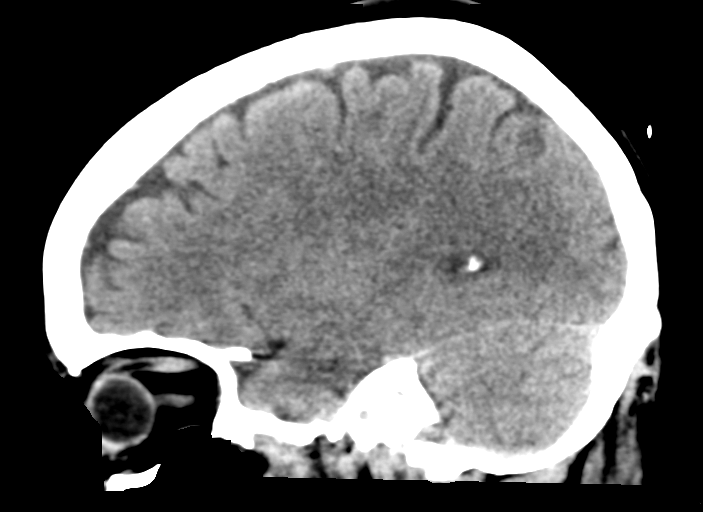

[15 of 46 positions shown; findings below may reference images not displayed]

FINDINGS: Brain: No evidence of acute infarction, hemorrhage, hydrocephalus,
extra-axial collection or mass lesion/mass effect. Subcentimeter
pituitary lesion on prior MRI is not appreciable on CT.

Vascular: No hyperdense vessel or unexpected calcification.

Skull: Normal. Negative for fracture or focal lesion.

Sinuses/Orbits: No acute finding.

Other: None.
IMPRESSION: 1. No acute intracranial abnormality.
2. Subcentimeter pituitary lesion on prior MRI is not appreciable on
CT.
3. Otherwise unremarkable stable CT of the head.

By: Son Linder M.D.

## 2017-09-18 ENCOUNTER — Other Ambulatory Visit: Payer: Self-pay

## 2017-09-18 ENCOUNTER — Encounter (HOSPITAL_COMMUNITY): Payer: Self-pay | Admitting: Emergency Medicine

## 2017-09-18 ENCOUNTER — Emergency Department (HOSPITAL_COMMUNITY): Payer: 59

## 2017-09-18 ENCOUNTER — Emergency Department (HOSPITAL_COMMUNITY)
Admission: EM | Admit: 2017-09-18 | Discharge: 2017-09-19 | Disposition: A | Discharge status: Home / Self Care | Payer: 59 | Attending: Emergency Medicine | Admitting: Emergency Medicine

## 2017-09-18 DIAGNOSIS — R4587 Impulsiveness: Secondary | ICD-10-CM | POA: Diagnosis not present

## 2017-09-18 DIAGNOSIS — R05 Cough: Secondary | ICD-10-CM | POA: Insufficient documentation

## 2017-09-18 DIAGNOSIS — Z79899 Other long term (current) drug therapy: Secondary | ICD-10-CM | POA: Insufficient documentation

## 2017-09-18 DIAGNOSIS — R443 Hallucinations, unspecified: Secondary | ICD-10-CM

## 2017-09-18 DIAGNOSIS — Z87891 Personal history of nicotine dependence: Secondary | ICD-10-CM | POA: Insufficient documentation

## 2017-09-18 DIAGNOSIS — J45909 Unspecified asthma, uncomplicated: Secondary | ICD-10-CM | POA: Diagnosis not present

## 2017-09-18 DIAGNOSIS — R0602 Shortness of breath: Secondary | ICD-10-CM | POA: Diagnosis not present

## 2017-09-18 DIAGNOSIS — F419 Anxiety disorder, unspecified: Secondary | ICD-10-CM

## 2017-09-18 DIAGNOSIS — F4322 Adjustment disorder with anxiety: Secondary | ICD-10-CM | POA: Diagnosis present

## 2017-09-18 LAB — COMPREHENSIVE METABOLIC PANEL
ALT: 49 U/L (ref 14–54)
ANION GAP: 12 (ref 5–15)
AST: 27 U/L (ref 15–41)
Albumin: 4.5 g/dL (ref 3.5–5.0)
Alkaline Phosphatase: 50 U/L (ref 38–126)
BUN: 16 mg/dL (ref 6–20)
CALCIUM: 10 mg/dL (ref 8.9–10.3)
CHLORIDE: 104 mmol/L (ref 101–111)
CO2: 22 mmol/L (ref 22–32)
CREATININE: 1.23 mg/dL — AB (ref 0.44–1.00)
GFR, EST AFRICAN AMERICAN: 59 mL/min — AB (ref >60)
GFR, EST NON AFRICAN AMERICAN: 51 mL/min — AB (ref >60)
Glucose, Bld: 117 mg/dL — ABNORMAL HIGH (ref 65–99)
Potassium: 3.7 mmol/L (ref 3.5–5.1)
Sodium: 138 mmol/L (ref 135–145)
Total Bilirubin: 0.7 mg/dL (ref 0.3–1.2)
Total Protein: 7.6 g/dL (ref 6.5–8.1)

## 2017-09-18 LAB — POCT I-STAT TROPONIN I: TROPONIN I, POC: 0 ng/mL (ref 0.00–0.08)

## 2017-09-18 LAB — CBC
HCT: 41.7 % (ref 36.0–46.0)
Hemoglobin: 14.7 g/dL (ref 12.0–15.0)
MCH: 32.2 pg (ref 26.0–34.0)
MCHC: 35.3 g/dL (ref 30.0–36.0)
MCV: 91.2 fL (ref 78.0–100.0)
PLATELETS: 398 10*3/uL (ref 150–400)
RBC: 4.57 MIL/uL (ref 3.87–5.11)
RDW: 12.9 % (ref 11.5–15.5)
WBC: 11.6 10*3/uL — AB (ref 4.0–10.5)

## 2017-09-18 LAB — LIPASE, BLOOD: LIPASE: 26 U/L (ref 11–51)

## 2017-09-18 MED ORDER — PROMETHAZINE HCL 25 MG/ML IJ SOLN: 25.0000 mg | Freq: Once | INTRAMUSCULAR | Status: DC

## 2017-09-18 MED ORDER — LORAZEPAM 2 MG/ML IJ SOLN: 1.0000 mg | Freq: Once | INTRAMUSCULAR | Status: DC

## 2017-09-18 MED ORDER — ZIPRASIDONE HCL 20 MG PO CAPS
40.0000 mg | ORAL_CAPSULE | Freq: Every day | ORAL | Status: DC
  Filled 2017-09-18: qty 2

## 2017-09-18 MED ORDER — LAMOTRIGINE 25 MG PO TABS
25.0000 mg | ORAL_TABLET | Freq: Every day | ORAL | Status: DC
  Administered 2017-09-19: 25 mg via ORAL
  Filled 2017-09-18: qty 1

## 2017-09-18 MED ORDER — VITAMIN B-1 100 MG PO TABS
100.0000 mg | ORAL_TABLET | Freq: Every day | ORAL | Status: DC
  Administered 2017-09-19: 100 mg via ORAL
  Filled 2017-09-18: qty 1

## 2017-09-18 MED ORDER — PROMETHAZINE HCL 25 MG/ML IJ SOLN
25.0000 mg | Freq: Once | INTRAMUSCULAR | Status: AC
  Administered 2017-09-18: 25 mg via INTRAVENOUS
  Filled 2017-09-18: qty 1

## 2017-09-18 MED ORDER — SERTRALINE HCL 50 MG PO TABS
50.0000 mg | ORAL_TABLET | Freq: Every day | ORAL | Status: DC
  Administered 2017-09-19: 50 mg via ORAL
  Filled 2017-09-18: qty 1

## 2017-09-18 MED ORDER — LORAZEPAM 2 MG/ML IJ SOLN
1.0000 mg | Freq: Once | INTRAMUSCULAR | Status: AC
  Administered 2017-09-18: 1 mg via INTRAVENOUS
  Filled 2017-09-18: qty 1

## 2017-09-18 MED ORDER — IPRATROPIUM-ALBUTEROL 0.5-2.5 (3) MG/3ML IN SOLN
3.0000 mL | Freq: Once | RESPIRATORY_TRACT | Status: AC
  Administered 2017-09-18: 3 mL via RESPIRATORY_TRACT
  Filled 2017-09-18: qty 3

## 2017-09-18 MED ORDER — HALOPERIDOL LACTATE 5 MG/ML IJ SOLN
10.0000 mg | Freq: Once | INTRAMUSCULAR | Status: AC
  Administered 2017-09-18: 10 mg via INTRAMUSCULAR
  Filled 2017-09-18: qty 2

## 2017-09-18 MED ORDER — SODIUM CHLORIDE 0.9 % IV BOLUS (SEPSIS)
1000.0000 mL | Freq: Once | INTRAVENOUS | Status: AC
  Administered 2017-09-18: 1000 mL via INTRAVENOUS

## 2017-09-18 MED ORDER — DIPHENHYDRAMINE HCL 50 MG/ML IJ SOLN
25.0000 mg | Freq: Once | INTRAMUSCULAR | Status: AC
  Administered 2017-09-18: 25 mg via INTRAVENOUS
  Filled 2017-09-18: qty 1

## 2017-09-18 MED ORDER — ONDANSETRON HCL 4 MG/2ML IJ SOLN
4.0000 mg | Freq: Once | INTRAMUSCULAR | Status: AC
  Administered 2017-09-18: 4 mg via INTRAVENOUS
  Filled 2017-09-18: qty 2

## 2017-09-18 MED ORDER — SODIUM CHLORIDE 0.9 % IV BOLUS (SEPSIS): 1000.0000 mL | Freq: Once | INTRAVENOUS | Status: DC

## 2017-09-18 NOTE — ED Notes (Signed)
Pt states that she is unable to provide urine sample at this time.  

## 2017-09-18 NOTE — ED Notes (Signed)
Better EKG performed. Gave to doctor.

## 2017-09-18 NOTE — ED Triage Notes (Addendum)
Per EMS, patient from home, c/o anxiety and hyperventilation. Daughter reports patient has been hallucinating x1 week. Denies SI.   Patient c/o abdominal pain and vomiting x1 week with shortness of breath.  BP 150/70 HR 100 O2 100%

## 2017-09-18 NOTE — ED Provider Notes (Signed)
[facility] DEPT Provider Note   CSN: [id number] Arrival date & time: 09/18/17  0938     History   Chief Complaint Chief Complaint  Patient presents with  . Emesis  . Abdominal Pain  . Anxiety    HPI Deborah Pena is a 51 y.o. female.  HPI   Deborah Pena is a 51yo female with a history of asthma, schizoaffective disorder, panic disorder, alcohol abuse, polysubstance abuse, PTSD who presents to the Emergency Department for evaluation of cough, shortness of breath, anxiety, feeling of throat closing in, chest pain, stomach pain, N/V. She appears visibly shaky, nervous and distressed in the bed. States that symptoms started with a fever about a week ago. She states that since that time she has felt exceedingly anxious, short of breath, as if her throat is going to close in on her. She states that she ran out of her albuterol inhaler several weeks ago. She also feels sweaty. States that she started having chest pain today. Pain is left sided, non-radiating, 8/10 in severity and feels "as if my heart is outside of my body." She also endorses 5/10 generalized abdominal pain with N/V. States she has vomited about 3 times a day for the past week. States that her throat is "on fire." Per EMS, daughter reports patient had been hallucinating for a week. Patient denies having hallucinations and denies SI to me. She denies recent alcohol, substance use. Denies history of MI. No hemoptysis, no recent surgery, no past DVT, no exogenous hormone use.   Past Medical History:  Diagnosis Date  . Anemia   . Anginal pain ([place])   . Anxiety   . Arthritis    "left hand" (04/26/2016)  . Asthma   . Chronic lower back pain   . Diverticulitis of colon   . GERD (gastroesophageal reflux disease)   . Headache    'once or twice/week" (04/26/2016)  . Migraine    "twice/month" (04/26/2016)  . Stomach ulcer     Patient Active Problem List   Diagnosis Date Noted  . Hyperprolactinemia ([place]) 08/16/2017  . ADD  (attention deficit disorder) 08/14/2017  . Severe benzodiazepine use disorder ([place]) 08/14/2017  . Alcohol abuse 03/13/2017  . Drug overdose   . Schizoaffective disorder, depressive type ([place]) 01/16/2017  . PTSD (post-traumatic stress disorder) 01/16/2017  . Panic disorder 01/16/2017  . Acute diverticulitis 04/26/2016  . Diverticulitis of large intestine with abscess without bleeding 04/26/2016  . Intractable migraine 07/27/2014  . Insomnia 05/26/2013  . Adjustment disorder with anxious mood 12/06/2012  . 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 ABDOMINAL HYSTERECTOMY  2013   preformed at [facility] by Dr Jacqualyn Posey in April  . BREAST SURGERY Bilateral ~ 2000   "dual lateral duct removal"   . BUNIONECTOMY Right 2013  . CESAREAN SECTION  1999  . OOPHORECTOMY Right 2013  . TONSILLECTOMY      OB History    No data available       Home Medications    Prior to Admission medications   Medication Sig Start Date End Date Taking? Authorizing Provider  acetaminophen (TYLENOL) 325 MG tablet Take 2 tablets (650 mg total) by mouth every 6 (six) hours as needed. For fever/pain 08/18/17   Lindell Spar I, NP  benztropine (COGENTIN) 0.5 MG tablet Take 1 tablet (0.5 mg total) by mouth daily. For prevention of drug induced tremors. 08/19/17   Encarnacion Slates, NP  ibuprofen (ADVIL) 200 MG tablet Take 2 tablets (400 mg total) by mouth every 6 (six) hours as needed (for headaches). 08/18/17   Lindell Spar I, NP  lamoTRIgine (LAMICTAL) 25 MG tablet Take 1 tablet (25 mg total) by mouth daily. For mood stabilization 08/19/17   Lindell Spar I, NP  mirtazapine (REMERON) 7.5 MG tablet Take 1 tablet (7.5 mg total) by mouth at bedtime. For sleep 08/18/17   Lindell Spar I, NP  Multiple Vitamin (MULTIVITAMIN WITH MINERALS) TABS tablet Take 1 tablet by mouth daily. Vitamin supplement 08/18/17   Lindell Spar I, NP  nicotine (NICODERM  CQ - DOSED IN MG/24 HOURS) 21 mg/24hr patch Place 1 patch (21 mg total) onto the skin daily at 6 (six) AM. For smoking cessation 08/18/17   Lindell Spar I, NP  prazosin (MINIPRESS) 1 MG capsule Take 1 capsule (1 mg total) by mouth at bedtime. For PTSD related nightmares 08/18/17   Lindell Spar I, NP  sertraline (ZOLOFT) 50 MG tablet Take 1 tablet (50 mg total) by mouth daily. For depression 08/19/17   Lindell Spar I, NP  thiamine 100 MG tablet Take 1 tablet (100 mg total) by mouth daily. For low thiamine replacement 08/19/17   Lindell Spar I, NP  ziprasidone (GEODON) 40 MG capsule Take 1 capsule (40 mg total) by mouth daily with supper. For mood control 08/18/17   Encarnacion Slates, NP    Family History Family History  Problem Relation Age of Onset  . Heart failure Father   . Diabetes Other   . Breast cancer Other   . Diverticulitis Other   . Mental illness Paternal Grandmother   . Mental illness Paternal Grandfather   . Schizophrenia Paternal Aunt   . Suicidality Maternal Aunt     Social History Social History  Substance Use Topics  . Smoking status: Former Smoker    Packs/day: 2.00    Years: 30.00    Types: Cigarettes  . Smokeless tobacco: Former Systems developer    Types: Chew     Comment: "quit smoking cigarettes in ~ 2011; chewed when I was little"  . Alcohol use No     Allergies   Buprenorphine hcl; Ciprofloxacin; Codeine; and Propranolol   Review of Systems Review of Systems  Constitutional: Positive for chills, diaphoresis and fever.  HENT: Positive for congestion and sore throat. Negative for mouth sores, trouble swallowing and voice change.   Respiratory: Positive for cough, shortness of breath and wheezing.   Cardiovascular: Positive for chest pain and palpitations.  Gastrointestinal: Positive for abdominal pain, nausea and vomiting. Negative for diarrhea.  Genitourinary: Negative for difficulty urinating.  Musculoskeletal: Positive for arthralgias.  Skin: Negative for rash and  wound.  Neurological: Negative for dizziness, syncope, light-headedness, numbness and headaches.  Psychiatric/Behavioral: Positive for agitation. Negative for hallucinations and suicidal ideas. The patient is nervous/anxious.      Physical Exam Updated Vital Signs BP (!) 146/69 (BP Location: Right Arm)   Pulse 95   Temp 98.3 F (36.8 C) (Oral)   Resp 16   SpO2 100%   Physical Exam  Constitutional: She is oriented to person, place, and time. She appears well-developed and well-nourished. She appears distressed.  Patient appears agitated and anxious. Her eyes are fluttering and she is fidgeting in bed throughout the encounter.   HENT:  Head: Normocephalic and atraumatic.  Mouth/Throat: Oropharynx is clear and moist. No oropharyngeal exudate.  Eyes: Pupils are equal, round, and reactive to light. Conjunctivae are normal. Right eye exhibits  no discharge. Left eye exhibits no discharge.  Neck: Normal range of motion. Neck supple. No JVD present. No tracheal deviation present.  Cardiovascular: Normal rate, regular rhythm and intact distal pulses.  Exam reveals no friction rub.   No murmur heard. Pulmonary/Chest: Effort normal. No respiratory distress. She has no rales.  Poor inspiratory effort. Mild inspiratory wheezes heard in upper lung fields bilaterally.   Abdominal: Soft. Bowel sounds are normal. There is no tenderness. There is no guarding.  Patient is not acutely tender to palpation over the abdomen.   Musculoskeletal: Normal range of motion.  No leg swelling  Lymphadenopathy:    She has no cervical adenopathy.  Neurological: She is alert and oriented to person, place, and time. Coordination normal.  Skin: Skin is warm and dry. Capillary refill takes less than 2 seconds.  Psychiatric:  Voice is rapid. Patient states she is nervous/anxious. Patient denies SI/HI.   Nursing note and vitals reviewed.    ED Treatments / Results  Labs (all labs ordered are listed, but only  abnormal results are displayed) Labs Reviewed  COMPREHENSIVE METABOLIC PANEL - Abnormal; Notable for the following:       Result Value   Glucose, Bld 117 (*)    Creatinine, Ser 1.23 (*)    GFR calc non Af Amer 51 (*)    GFR calc Af Amer 59 (*)    All other components within normal limits  CBC - Abnormal; Notable for the following:    WBC 11.6 (*)    All other components within normal limits  LIPASE, BLOOD  I-STAT TROPONIN, ED  POCT I-STAT TROPONIN I    EKG  EKG Interpretation None       Radiology Dg Chest 2 View  Result Date: 09/18/2017 CLINICAL DATA:  Shortness of breath. Symptoms for 3 days, worse today. Nausea and vomiting. History of asthma. EXAM: CHEST  2 VIEW COMPARISON:  03/12/2017 FINDINGS: Shallow inspiration. Normal heart size and pulmonary vascularity. Lungs appear clear and expanded. No focal consolidation. No blunting of costophrenic angles. No pneumothorax. Mediastinal contours appear intact. IMPRESSION: Shallow inspiration.  No evidence of active pulmonary disease. Electronically Signed   By: Lucienne Capers M.D.   On: 09/18/2017 21:00    Procedures Procedures (including critical care time)  Medications Ordered in ED Medications  lamoTRIgine (LAMICTAL) tablet 25 mg (not administered)  sertraline (ZOLOFT) tablet 50 mg (not administered)  thiamine (VITAMIN B-1) tablet 100 mg (not administered)  ziprasidone (GEODON) capsule 40 mg (not administered)  LORazepam (ATIVAN) injection 1 mg (1 mg Intravenous Given 09/18/17 2226)  ondansetron (ZOFRAN) injection 4 mg (4 mg Intravenous Given 09/18/17 2226)  ipratropium-albuterol (DUONEB) 0.5-2.5 (3) MG/3ML nebulizer solution 3 mL (3 mLs Nebulization Given 09/18/17 2201)  sodium chloride 0.9 % bolus 1,000 mL (1,000 mLs Intravenous New Bag/Given 09/18/17 2259)  promethazine (PHENERGAN) injection 25 mg (25 mg Intravenous Given 09/18/17 2304)  diphenhydrAMINE (BENADRYL) injection 25 mg (25 mg Intravenous Given 09/18/17 2307)    LORazepam (ATIVAN) injection 1 mg (1 mg Intravenous Given 09/18/17 2303)  haloperidol lactate (HALDOL) injection 10 mg (10 mg Intramuscular Given 09/18/17 2303)     Initial Impression / Assessment and Plan / ED Course  I have reviewed the triage vital signs and the nursing notes.  Pertinent labs & imaging results that were available during my care of the patient were reviewed by me and considered in my medical decision making (see chart for details).  Clinical Course as of Sep 20 19  Sun  Sep 18, 2017  2249 Reevaluated patient after getting breathing treatment, ativan, zofran. Patient states that she is still short of breath. Lungs CTA, CXR negative, O2 sat 99% on RA, respiratory rate 16. Appears anxious.   [ES]  2343 Reevaluated patient. She is calm, drowsy. Actively hallucinating and asking about the "rock and roll music" playing in the room. Daughter in room states that she has been seeing "ghosts, snowflakes, cats" in the house over the past week. States she sees a psychiatrist who is always changing her medications which do not work. Patient denies SI/HI.   [ES]    Clinical Course User Index [ES] Glyn Ade, PA-C   Patient appears very anxious. Given ativan, haldol, benadryl. She has a mild wheeze heard in her upper lung fields. Wheezing improved after one duoneb treatment.   Patient states she is feeling better following medications. She is actively hallucinating in the room, reaching for items that are not there and asking about "rock and roll" music she is hearing. Daughter states she has been doing this for about a week now. Discussed this patient with Dr. Gilford Raid and will place patient in psych hold for evaluation by TTS.   Labs reviewed. Electrolytes normal. Patient's creatinine is bumped (1.23 today, 0.98 a month ago.) Given a bolus of fluids. WBC count 11.6, likely reactive. Patient is afebrile. Lipase normal, liver enzymes normal. Her abdomen is not tender on exam, do  not suspect surgical abdomen and will not proceed with imaging at this time.   Chest pain is not likely of cardiac or pulmonary etiology d/t presentation, PERC negative, VSS, no tracheal deviation, no JVD or new murmur, RRR, breath sounds equal bilaterally, EKG without acute abnormalities, negative troponin, and negative CXR. Patient is medically cleared.  Sign out to [facility] who will disposition the patient following TTS evaluation. Patient is voluntary.    Final Clinical Impressions(s) / ED Diagnoses   Final diagnoses:  Anxiety  Hallucinations  Shortness of breath    New Prescriptions New Prescriptions   No medications on file     Bernarda Caffey 09/19/17 0034    Isla Pence, MD 09/19/17 2008

## 2017-09-19 ENCOUNTER — Other Ambulatory Visit: Payer: Self-pay

## 2017-09-19 DIAGNOSIS — F4322 Adjustment disorder with anxiety: Secondary | ICD-10-CM

## 2017-09-19 DIAGNOSIS — R4587 Impulsiveness: Secondary | ICD-10-CM | POA: Diagnosis not present

## 2017-09-19 MED ORDER — DIPHENHYDRAMINE HCL 25 MG PO CAPS
50.0000 mg | ORAL_CAPSULE | Freq: Once | ORAL | Status: AC
  Administered 2017-09-19: 50 mg via ORAL
  Filled 2017-09-19: qty 2

## 2017-09-19 MED ORDER — ALBUTEROL SULFATE HFA 108 (90 BASE) MCG/ACT IN AERS
2.0000 | INHALATION_SPRAY | Freq: Once | RESPIRATORY_TRACT | Status: AC
  Administered 2017-09-19: 2 via RESPIRATORY_TRACT
  Filled 2017-09-19: qty 6.7

## 2017-09-19 MED ORDER — DIPHENHYDRAMINE HCL 25 MG PO CAPS
ORAL_CAPSULE | ORAL | Status: AC
  Administered 2017-09-19: 50 mg via ORAL
  Filled 2017-09-19: qty 1

## 2017-09-19 MED ORDER — DEXAMETHASONE 6 MG PO TABS
10.0000 mg | ORAL_TABLET | Freq: Once | ORAL | Status: AC
  Administered 2017-09-19: 10 mg via ORAL
  Filled 2017-09-19: qty 1

## 2017-09-19 MED ORDER — ACETAMINOPHEN 325 MG PO TABS
650.0000 mg | ORAL_TABLET | Freq: Once | ORAL | Status: AC
  Administered 2017-09-19: 650 mg via ORAL
  Filled 2017-09-19: qty 2

## 2017-09-19 MED ORDER — DEXAMETHASONE 4 MG PO TABS
ORAL_TABLET | ORAL | Status: AC
  Filled 2017-09-19: qty 2

## 2017-09-19 MED ORDER — DEXAMETHASONE 2 MG PO TABS
ORAL_TABLET | ORAL | Status: AC
  Filled 2017-09-19: qty 1
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
## 2017-09-19 NOTE — Discharge Instructions (Signed)
Your chest xray was clear today, no pneumonia. Your EKG and blood work showed no evidence of heart attack.   Sending you home with a prescription for albuterol inhaler. Please use this as needed for wheezing and shortness of breath.   Please schedule an appointment with your Psychiatrist for follow up of your ER visit today and for further management of your psychiatric medications.   Return to the ER if you get shortness of breath or wheezing that does not improve with albuterol inhaler, chest pain with shortness of breath, chest pain that radiates to your arm or jaw, if you have thoughts of hurting yourself or others or any new or worsening symptoms.   FOR YOUR BEHAVIORAL HEALTH NEEDS: You are advised to continue treatment with Noemi Chapel, NP at [facility].  You have indicated that your next scheduled appointment is on Thursday, October 13, 2017.  You are advised to keep this appointment:       Noemi Chapel, NP      [facility]      [address] [place]      [place], [place] [address]      [phone]

## 2017-09-19 NOTE — BHH Suicide Risk Assessment (Signed)
Suicide Risk Assessment  Discharge Assessment   [facility] Discharge Suicide Risk Assessment   Principal Problem: Adjustment disorder with anxious mood Discharge Diagnoses:  Patient Active Problem List   Diagnosis Date Noted  . Hyperprolactinemia ([place]) [E22.1] 08/16/2017  . ADD (attention deficit disorder) [F98.8] 08/14/2017  . Severe benzodiazepine use disorder ([place]) [F13.20] 08/14/2017  . Alcohol abuse [F10.10] 03/13/2017  . Drug overdose [T50.901A]   . Schizoaffective disorder, depressive type ([place]) [F25.1] 01/16/2017  . PTSD (post-traumatic stress disorder) [F43.10] 01/16/2017  . Panic disorder [F41.0] 01/16/2017  . Acute diverticulitis [K57.92] 04/26/2016  . Diverticulitis of large intestine with abscess without bleeding [K57.20] 04/26/2016  . Intractable migraine [G43.919] 07/27/2014  . Insomnia [G47.00] 05/26/2013  . Adjustment disorder with anxious mood [F43.22] 12/06/2012  . 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 Pt presented with multiple somatic complaints related to an upper respiratory infection. Per EMS, Pt's daughter reported Pt had been hallucinating for one week. Pt stated she hallucinated for one day seeing "green ghosts" and believes it was related to the high fever she had. Today,  Pt denies suicidal/homicidal ideation, denies auditory/visual hallucinations and does not appear to be responding to internal stimuli. Pt sees Dr. Noemi Chapel for medication management and therapy and has an appointment on Oct 13, 2017. Pt is psychiatrically cleared.   Total Time spent with patient: 45 minutes  Musculoskeletal: Strength & Muscle Tone: within normal limits Gait & Station: normal Patient leans: N/A  Psychiatric Specialty Exam:   Blood pressure 113/62, pulse 94, temperature 98.3 F (36.8 C), temperature source Oral, resp. rate 13, SpO2 100 %.There is no height or weight  on file to calculate BMI.  General Appearance: Casual  Eye Contact::  Good  Speech:  Clear and Coherent and Normal Rate409  Volume:  Normal  Mood:  Euthymic  Affect:  Appropriate and Congruent  Thought Process:  Coherent, Goal Directed and Linear  Orientation:  Full (Time, Place, and Person)  Thought Content:  Logical  Suicidal Thoughts:  No  Homicidal Thoughts:  No  Memory:  Immediate;   Good Recent;   Good Remote;   Fair  Judgement:  Good  Insight:  Good  Psychomotor Activity:  Normal  Concentration:  Good  Recall:  Good  Fund of Knowledge:Good  Language: Good  Akathisia:  No  Handed:  Right  AIMS (if indicated):     Assets:  Agricultural consultant Housing Physical Health Resilience Social Support Transportation  Sleep:     Cognition: WNL  ADL's:  Intact   Mental Status Per Nursing Assessment::   On Admission:   anxious  Demographic Factors:  Caucasian  Loss Factors: NA  Historical Factors: Impulsivity  Risk Reduction Factors:   Sense of responsibility to family and Living with another person, especially a relative  Continued Clinical Symptoms:  Severe Anxiety and/or Agitation Depression:   Impulsivity More than one psychiatric diagnosis  Cognitive Features That Contribute To Risk:  Closed-mindedness    Suicide Risk:  Minimal: No identifiable suicidal ideation.  Patients presenting with no risk factors but with morbid ruminations; may be classified as minimal risk based on the severity of the depressive symptoms  Follow-up [place] DEPT.   Specialty:  Emergency Medicine Why:  Return to the ER with new or wosening symptoms or new conerns. Contact information: [place] [id number] [place]  [place]       Call  Noemi Chapel, NP.   Why:  schedule an appointment for follow up of your ER visit today for management of psychiatric  medications Contact information: [address] [place] [address] [phone]           Plan Of Care/Follow-up recommendations:  Activity:  as tolerated Diet:  Heart Healthy  Ethelene Hal, NP 09/19/2017, 12:00 PM

## 2017-09-19 NOTE — BHH Suicide Risk Assessment (Deleted)
Suicide Risk Assessment  Discharge Assessment   [facility] Discharge Suicide Risk Assessment   Principal Problem: Adjustment disorder with anxious mood Discharge Diagnoses:  Patient Active Problem List   Diagnosis Date Noted  . Hyperprolactinemia ([place]) [E22.1] 08/16/2017  . ADD (attention deficit disorder) [F98.8] 08/14/2017  . Severe benzodiazepine use disorder ([place]) [F13.20] 08/14/2017  . Alcohol abuse [F10.10] 03/13/2017  . Drug overdose [T50.901A]   . Schizoaffective disorder, depressive type ([place]) [F25.1] 01/16/2017  . PTSD (post-traumatic stress disorder) [F43.10] 01/16/2017  . Panic disorder [F41.0] 01/16/2017  . Acute diverticulitis [K57.92] 04/26/2016  . Diverticulitis of large intestine with abscess without bleeding [K57.20] 04/26/2016  . Intractable migraine [G43.919] 07/27/2014  . Insomnia [G47.00] 05/26/2013  . Adjustment disorder with anxious mood [F43.22] 12/06/2012  . 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 Pt presented with multiple somatic complaints related to an upper respiratory infection. Per EMS, Pt's daughter reported Pt had been hallucinating for one week. Pt stated she hallucinated for one day seeing "green ghosts" and believes it was related to the high fever she had. Today,  Pt denies suicidal/homicidal ideation, denies auditory/visual hallucinations and does not appear to be responding to internal stimuli. Pt sees Dr. Noemi Chapel for medication management and therapy and has an appointment on Oct 13, 2017. Pt is psychiatrically clear for discharge.   Total Time spent with patient: 45 minutes  Musculoskeletal: Strength & Muscle Tone: within normal limits Gait & Station: normal Patient leans: N/A  Psychiatric Specialty Exam:   Blood pressure 113/62, pulse 94, temperature 98.3 F (36.8 C), temperature source Oral, resp. rate 13, SpO2 100 %.There is no  height or weight on file to calculate BMI.  General Appearance: Casual  Eye Contact::  Good  Speech:  Clear and Coherent and Normal Rate409  Volume:  Normal  Mood:  Euthymic  Affect:  Congruent  Thought Process:  Coherent, Goal Directed and Linear  Orientation:  Full (Time, Place, and Person)  Thought Content:  Logical  Suicidal Thoughts:  No  Homicidal Thoughts:  No  Memory:  Immediate;   Good Recent;   Good Remote;   Fair  Judgement:  Good  Insight:  Good  Psychomotor Activity:  Normal  Concentration:  Good  Recall:  Good  Fund of Knowledge:Good  Language: Good  Akathisia:  No  Handed:  Right  AIMS (if indicated):     Assets:  Agricultural consultant Housing Leisure Time Physical Health Resilience Social Support Transportation Vocational/Educational  Sleep:     Cognition: WNL  ADL's:  Intact   Mental Status Per Nursing Assessment::   On Admission:     Demographic Factors:  Caucasian  Loss Factors: NA  Historical Factors: Impulsivity  Risk Reduction Factors:   Sense of responsibility to family, Living with another person, especially a relative, Positive social support and Positive therapeutic relationship  Continued Clinical Symptoms:  Severe Anxiety and/or Agitation Depression:   Impulsivity Alcohol/Substance Abuse/Dependencies More than one psychiatric diagnosis  Cognitive Features That Contribute To Risk:  Closed-mindedness    Suicide Risk:  Minimal: No identifiable suicidal ideation.  Patients presenting with no risk factors but with morbid ruminations; may be classified as minimal risk based on the severity of the depressive symptoms  Follow-up [place] DEPT.   Specialty:  Emergency Medicine Why:  Return to the ER with new or wosening symptoms or new  conerns. Contact information: [place] [id number] [place] [address] [phone]        Call  Noemi Chapel, NP.   Why:  schedule an appointment for follow up of your ER visit today for management of psychiatric medications Contact information: [address] [place] [address] [phone]           Plan Of Care/Follow-up recommendations:  Activity:  as tolerated Diet:  Heart Healthy  Ethelene Hal, NP 09/19/2017, 11:37 AM

## 2017-09-19 NOTE — BH Assessment (Signed)
[place] Assessment Progress Note  Per Corena Pilgrim, MD, this pt does not require psychiatric hospitalization at this time.  Pt is to be discharged from [facility] with recommendation to continue treatment with Noemi Chapel, NP at [facility].  She reports that her next scheduled appointment in on Thursday, 10/13/2017.  This has been included in pt's discharge instructions.  Pt's nurse has been notified.  Jalene Mullet, [place] Triage Specialist [phone]

## 2017-09-19 NOTE — ED Notes (Signed)
Patient resting in bed.  No signs of distress noted

## 2017-09-19 NOTE — ED Provider Notes (Signed)
I was asked to evaluate the patient. She is here with worsening anxiety. She initially reported that her chest and throat or tightening. She was not found to have signs of anaphylaxis. This is an ongoing issue. She has been cleared by psychiatry and is stable for discharge. Upon being told that she would be discharged, she complained of a sensation that her throat was closing. On my assessment, the patient has no objective evidence of lip or oral swelling. She does have some mild wheezing and has a history of asthma but I do not suspect this is secondary to a true allergic reaction or anaphylaxis. Nonetheless, will place her on Decadron, give scheduled anti-histamines at home, and good return precautions.   Duffy Bruce, MD 09/19/17 [phone]

## 2017-10-09 ENCOUNTER — Emergency Department (HOSPITAL_COMMUNITY)
Admission: EM | Admit: 2017-10-09 | Discharge: 2017-10-10 | Disposition: A | Discharge status: Home / Self Care | Payer: 59 | Attending: Emergency Medicine | Admitting: Emergency Medicine

## 2017-10-09 ENCOUNTER — Emergency Department (HOSPITAL_COMMUNITY): Payer: 59

## 2017-10-09 ENCOUNTER — Encounter (HOSPITAL_COMMUNITY): Payer: Self-pay | Admitting: Emergency Medicine

## 2017-10-09 DIAGNOSIS — R5383 Other fatigue: Secondary | ICD-10-CM | POA: Diagnosis not present

## 2017-10-09 DIAGNOSIS — F4322 Adjustment disorder with anxiety: Secondary | ICD-10-CM | POA: Diagnosis not present

## 2017-10-09 DIAGNOSIS — R0602 Shortness of breath: Secondary | ICD-10-CM | POA: Insufficient documentation

## 2017-10-09 DIAGNOSIS — J45909 Unspecified asthma, uncomplicated: Secondary | ICD-10-CM | POA: Insufficient documentation

## 2017-10-09 DIAGNOSIS — F1010 Alcohol abuse, uncomplicated: Secondary | ICD-10-CM | POA: Diagnosis not present

## 2017-10-09 DIAGNOSIS — Z79899 Other long term (current) drug therapy: Secondary | ICD-10-CM | POA: Insufficient documentation

## 2017-10-09 DIAGNOSIS — F909 Attention-deficit hyperactivity disorder, unspecified type: Secondary | ICD-10-CM | POA: Insufficient documentation

## 2017-10-09 DIAGNOSIS — F259 Schizoaffective disorder, unspecified: Secondary | ICD-10-CM | POA: Insufficient documentation

## 2017-10-09 DIAGNOSIS — Z87891 Personal history of nicotine dependence: Secondary | ICD-10-CM | POA: Insufficient documentation

## 2017-10-09 DIAGNOSIS — F419 Anxiety disorder, unspecified: Secondary | ICD-10-CM | POA: Diagnosis not present

## 2017-10-09 DIAGNOSIS — F251 Schizoaffective disorder, depressive type: Secondary | ICD-10-CM | POA: Diagnosis present

## 2017-10-09 LAB — CBC WITH DIFFERENTIAL/PLATELET
BASOS ABS: 0 10*3/uL (ref 0.0–0.1)
Basophils Relative: 0 %
EOS ABS: 0.1 10*3/uL (ref 0.0–0.7)
Eosinophils Relative: 1 %
HCT: 41.3 % (ref 36.0–46.0)
HEMOGLOBIN: 13.9 g/dL (ref 12.0–15.0)
Lymphocytes Relative: 34 %
Lymphs Abs: 2.3 10*3/uL (ref 0.7–4.0)
MCH: 31.6 pg (ref 26.0–34.0)
MCHC: 33.7 g/dL (ref 30.0–36.0)
MCV: 93.9 fL (ref 78.0–100.0)
Monocytes Absolute: 0.5 10*3/uL (ref 0.1–1.0)
Monocytes Relative: 7 %
NEUTROS PCT: 58 %
Neutro Abs: 4 10*3/uL (ref 1.7–7.7)
Platelets: 250 10*3/uL (ref 150–400)
RBC: 4.4 MIL/uL (ref 3.87–5.11)
RDW: 13.5 % (ref 11.5–15.5)
WBC: 6.9 10*3/uL (ref 4.0–10.5)

## 2017-10-09 LAB — BASIC METABOLIC PANEL
Anion gap: 7 (ref 5–15)
BUN: 11 mg/dL (ref 6–20)
CALCIUM: 9.8 mg/dL (ref 8.9–10.3)
CO2: 19 mmol/L — AB (ref 22–32)
CREATININE: 0.94 mg/dL (ref 0.44–1.00)
Chloride: 112 mmol/L — ABNORMAL HIGH (ref 101–111)
GFR calc Af Amer: >60 mL/min (ref >60)
GLUCOSE: 100 mg/dL — AB (ref 65–99)
Potassium: 3.9 mmol/L (ref 3.5–5.1)
Sodium: 138 mmol/L (ref 135–145)

## 2017-10-09 LAB — HEPATIC FUNCTION PANEL
ALBUMIN: 4.1 g/dL (ref 3.5–5.0)
ALK PHOS: 62 U/L (ref 38–126)
ALT: 30 U/L (ref 14–54)
AST: 30 U/L (ref 15–41)
BILIRUBIN TOTAL: 0.5 mg/dL (ref 0.3–1.2)
Total Protein: 7 g/dL (ref 6.5–8.1)

## 2017-10-09 LAB — SALICYLATE LEVEL: Salicylate Lvl: <7 mg/dL (ref 2.8–30.0)

## 2017-10-09 LAB — RAPID URINE DRUG SCREEN, HOSP PERFORMED
Amphetamines: NOT DETECTED
BARBITURATES: NOT DETECTED
Benzodiazepines: POSITIVE — AB
COCAINE: NOT DETECTED
Opiates: NOT DETECTED
TETRAHYDROCANNABINOL: NOT DETECTED

## 2017-10-09 LAB — URINALYSIS, ROUTINE W REFLEX MICROSCOPIC
BILIRUBIN URINE: NEGATIVE
Glucose, UA: NEGATIVE mg/dL
Hgb urine dipstick: NEGATIVE
Ketones, ur: NEGATIVE mg/dL
Leukocytes, UA: NEGATIVE
NITRITE: NEGATIVE
Protein, ur: NEGATIVE mg/dL
SPECIFIC GRAVITY, URINE: 1.012 (ref 1.005–1.030)
pH: 6 (ref 5.0–8.0)

## 2017-10-09 LAB — I-STAT BETA HCG BLOOD, ED (MC, WL, AP ONLY): I-stat hCG, quantitative: <5 m[IU]/mL (ref <5)

## 2017-10-09 LAB — ETHANOL

## 2017-10-09 LAB — ACETAMINOPHEN LEVEL: Acetaminophen (Tylenol), Serum: <10 ug/mL — ABNORMAL LOW (ref 10–30)

## 2017-10-09 MED ORDER — LURASIDONE HCL 20 MG PO TABS
20.0000 mg | ORAL_TABLET | Freq: Every day | ORAL | Status: DC
  Administered 2017-10-10: 20 mg via ORAL
  Filled 2017-10-09: qty 1

## 2017-10-09 MED ORDER — TRAZODONE HCL 100 MG PO TABS
300.0000 mg | ORAL_TABLET | Freq: Every evening | ORAL | Status: DC | PRN
  Administered 2017-10-09: 300 mg via ORAL
  Filled 2017-10-09: qty 3

## 2017-10-09 MED ORDER — TETRAHYDROZOLINE HCL 0.05 % OP SOLN
2.0000 [drp] | Freq: Every day | OPHTHALMIC | Status: DC | PRN
  Filled 2017-10-09: qty 15

## 2017-10-09 MED ORDER — ALBUTEROL SULFATE HFA 108 (90 BASE) MCG/ACT IN AERS: 2.0000 | INHALATION_SPRAY | Freq: Four times a day (QID) | RESPIRATORY_TRACT | Status: DC | PRN

## 2017-10-09 MED ORDER — ONDANSETRON HCL 4 MG/2ML IJ SOLN
4.0000 mg | Freq: Once | INTRAMUSCULAR | Status: AC
  Administered 2017-10-09: 4 mg via INTRAVENOUS
  Filled 2017-10-09: qty 2

## 2017-10-09 MED ORDER — KETOROLAC TROMETHAMINE 30 MG/ML IJ SOLN
30.0000 mg | Freq: Once | INTRAMUSCULAR | Status: AC
  Administered 2017-10-09: 30 mg via INTRAVENOUS
  Filled 2017-10-09: qty 1

## 2017-10-09 MED ORDER — SERTRALINE HCL 100 MG PO TABS
100.0000 mg | ORAL_TABLET | Freq: Every day | ORAL | Status: DC
  Administered 2017-10-10: 100 mg via ORAL
  Filled 2017-10-09: qty 1

## 2017-10-09 MED ORDER — PROCHLORPERAZINE EDISYLATE 5 MG/ML IJ SOLN
10.0000 mg | Freq: Once | INTRAMUSCULAR | Status: AC
  Administered 2017-10-09: 10 mg via INTRAVENOUS
  Filled 2017-10-09: qty 2

## 2017-10-09 MED ORDER — NICOTINE 21 MG/24HR TD PT24
21.0000 mg | MEDICATED_PATCH | Freq: Every day | TRANSDERMAL | Status: DC
  Administered 2017-10-10 (×2): 21 mg via TRANSDERMAL
  Filled 2017-10-09 (×2): qty 1

## 2017-10-09 NOTE — ED Notes (Signed)
ED Provider at bedside. 

## 2017-10-09 NOTE — ED Provider Notes (Signed)
[facility] EMERGENCY DEPARTMENT Provider Note   CSN: [id number] Arrival date & time: 10/09/17  1411     History   Chief Complaint Chief Complaint  Patient presents with  . Shortness of Breath    HPI Deborah Pena is a 51 y.o. female.  Patient is a 51 year old female who presents with shortness of breath.  She has a history of anxiety, migraines and prior substance and alcohol abuse.  She was recently seen September 30 at [facility] for anxiety shortness of breath and at that point was having hallucinations.  She was monitored they are in her early hallucinations resolved and it was not felt that she needed inpatient treatment.  She states she has been feeling short of breath for the last several days.  She denies any cough or congestion.  No fevers.  No vomiting although she has had some nausea.  No burning on urination.  No change in bowels.  No URI symptoms.  She does complain of a headache which is in the right frontal area.  She denies any history of migraines or headache although it is listed in her past medical history.  She denies any recent head trauma.  No neck pain or stiffness.  She currently denies any hallucinations.      Past Medical History:  Diagnosis Date  . Anemia   . Anginal pain ([place])   . Anxiety   . Arthritis    "left hand" (04/26/2016)  . Asthma   . Chronic lower back pain   . Diverticulitis of colon   . GERD (gastroesophageal reflux disease)   . Headache    'once or twice/week" (04/26/2016)  . Migraine    "twice/month" (04/26/2016)  . Stomach ulcer     Patient Active Problem List   Diagnosis Date Noted  . Hyperprolactinemia ([place]) 08/16/2017  . ADD (attention deficit disorder) 08/14/2017  . Severe benzodiazepine use disorder ([place]) 08/14/2017  . Alcohol abuse 03/13/2017  . Drug overdose   . Schizoaffective disorder, depressive type ([place]) 01/16/2017  . PTSD (post-traumatic stress disorder) 01/16/2017  . Panic disorder 01/16/2017    . Acute diverticulitis 04/26/2016  . Diverticulitis of large intestine with abscess without bleeding 04/26/2016  . Intractable migraine 07/27/2014  . Insomnia 05/26/2013  . Adjustment disorder with anxious mood 12/06/2012  . 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 ABDOMINAL HYSTERECTOMY  2013   preformed at [facility] by Dr Jacqualyn Posey in April  . BREAST SURGERY Bilateral ~ 2000   "dual lateral duct removal"   . BUNIONECTOMY Right 2013  . CESAREAN SECTION  1999  . OOPHORECTOMY Right 2013  . TONSILLECTOMY      OB History    No data available       Home Medications    Prior to Admission medications   Medication Sig Start Date End Date Taking? Authorizing Provider  acetaminophen (TYLENOL) 500 MG tablet Take 1,000 mg by mouth 2 (two) times daily as needed for headache (pain).    Yes [provider]  albuterol (PROVENTIL HFA;VENTOLIN HFA) 108 (90 Base) MCG/ACT inhaler Inhale 2 puffs into the lungs every 6 (six) hours as needed for wheezing or shortness of breath.   Yes [provider]  aspirin-acetaminophen-caffeine (EXCEDRIN MIGRAINE) [phone] MG tablet Take 2 tablets by mouth every 6 (six) hours as needed for headache or migraine.   Yes [provider]  B Complex Vitamins (B COMPLEX-B12 PO)  Take 2 tablets by mouth daily.    Yes [provider]  benztropine (COGENTIN) 1 MG tablet Take 1 mg by mouth 2 (two) times daily. For prevention of drug induced tremors   Yes [provider]  diphenhydrAMINE (BENADRYL) 25 MG tablet Take 25 mg by mouth 2 (two) times daily as needed (allergic reaction).    Yes [provider]  fluticasone (FLONASE) 50 MCG/ACT nasal spray Place 2 sprays into both nostrils every Wednesday.    Yes [provider]  ibuprofen (ADVIL) 200 MG tablet Take 2 tablets (400 mg total) by mouth every 6 (six) hours as needed (for  headaches). 08/18/17  Yes Lindell Spar I, NP  lurasidone (LATUDA) 20 MG TABS tablet Take 20 mg by mouth daily.   Yes [provider]  Multiple Vitamin (MULTIVITAMIN WITH MINERALS) TABS tablet Take 1 tablet by mouth daily. Vitamin supplement 08/18/17  Yes Nwoko, Herbert Pun I, NP  sertraline (ZOLOFT) 100 MG tablet Take 100 mg by mouth daily.    Yes [provider]  Tetrahydrozoline HCl (VISINE OP) Place 1 drop into both eyes daily as needed (dry eyes).   Yes [provider]  traZODone (DESYREL) 150 MG tablet Take 300 mg by mouth at bedtime as needed for sleep.   Yes [provider]  acetaminophen (TYLENOL) 325 MG tablet Take 2 tablets (650 mg total) by mouth every 6 (six) hours as needed. For fever/pain Patient not taking: Reported on 09/19/2017 08/18/17   Lindell Spar I, NP  benztropine (COGENTIN) 0.5 MG tablet Take 1 tablet (0.5 mg total) by mouth daily. For prevention of drug induced tremors. Patient not taking: Reported on 09/19/2017 08/19/17   Lindell Spar I, NP  lamoTRIgine (LAMICTAL) 25 MG tablet Take 1 tablet (25 mg total) by mouth daily. For mood stabilization Patient not taking: Reported on 09/19/2017 08/19/17   Lindell Spar I, NP  mirtazapine (REMERON) 7.5 MG tablet Take 1 tablet (7.5 mg total) by mouth at bedtime. For sleep Patient not taking: Reported on 09/19/2017 08/18/17   Lindell Spar I, NP  nicotine (NICODERM CQ - DOSED IN MG/24 HOURS) 21 mg/24hr patch Place 1 patch (21 mg total) onto the skin daily at 6 (six) AM. For smoking cessation Patient not taking: Reported on 09/19/2017 08/18/17   Lindell Spar I, NP  prazosin (MINIPRESS) 1 MG capsule Take 1 capsule (1 mg total) by mouth at bedtime. For PTSD related nightmares Patient not taking: Reported on 09/20/2017 08/18/17   Lindell Spar I, NP  sertraline (ZOLOFT) 50 MG tablet Take 1 tablet (50 mg total) by mouth daily. For depression Patient not taking: Reported on 09/19/2017 08/19/17   Lindell Spar I, NP  thiamine 100  MG tablet Take 1 tablet (100 mg total) by mouth daily. For low thiamine replacement Patient not taking: Reported on 10/09/2017 08/19/17   Lindell Spar I, NP  ziprasidone (GEODON) 40 MG capsule Take 1 capsule (40 mg total) by mouth daily with supper. For mood control Patient not taking: Reported on 09/20/2017 08/18/17   Encarnacion Slates, NP    Family History Family History  Problem Relation Age of Onset  . Heart failure Father   . Diabetes Other   . Breast cancer Other   . Diverticulitis Other   . Mental illness Paternal Grandmother   . Mental illness Paternal Grandfather   . Schizophrenia Paternal Aunt   . Suicidality Maternal Aunt     Social History Social History  Substance Use Topics  . Smoking  status: Former Smoker    Packs/day: 2.00    Years: 30.00    Types: Cigarettes  . Smokeless tobacco: Former Systems developer    Types: Chew     Comment: "quit smoking cigarettes in ~ 2011; chewed when I was little"  . Alcohol use No     Allergies   Lamictal [lamotrigine]; Buprenorphine hcl; Ciprofloxacin; and Propranolol   Review of Systems Review of Systems  Constitutional: Positive for fatigue. Negative for chills, diaphoresis and fever.  HENT: Negative for congestion, rhinorrhea and sneezing.   Eyes: Negative.   Respiratory: Positive for shortness of breath. Negative for cough and chest tightness.   Cardiovascular: Negative for chest pain and leg swelling.  Gastrointestinal: Positive for nausea. Negative for abdominal pain, blood in stool, diarrhea and vomiting.  Genitourinary: Negative for difficulty urinating, flank pain, frequency and hematuria.  Musculoskeletal: Negative for arthralgias and back pain.  Skin: Negative for rash.  Neurological: Positive for headaches. Negative for dizziness, speech difficulty, weakness and numbness.     Physical Exam Updated Vital Signs BP (!) 133/93   Pulse 76   Temp 98.2 F (36.8 C) (Oral)   Resp 13   Wt 87.1 kg (192 lb)   SpO2 97%   BMI  36.28 kg/m   Physical Exam  Constitutional: She is oriented to person, place, and time. She appears well-developed and well-nourished.  HENT:  Head: Normocephalic and atraumatic.  Eyes: Pupils are equal, round, and reactive to light.  Neck: Normal range of motion. Neck supple.  No meningismus  Cardiovascular: Normal rate, regular rhythm and normal heart sounds.   Pulmonary/Chest: Effort normal and breath sounds normal. No respiratory distress. She has no wheezes. She has no rales. She exhibits no tenderness.  Abdominal: Soft. Bowel sounds are normal. There is no tenderness. There is no rebound and no guarding.  Musculoskeletal: Normal range of motion. She exhibits no edema.  Lymphadenopathy:    She has no cervical adenopathy.  Neurological: She is alert and oriented to person, place, and time.  Motor 5 out of 5 all extremities, sensation grossly intact light touch in all extremities, no facial drooping, no slurred speech  Skin: Skin is warm and dry. No rash noted.  Psychiatric: She has a normal mood and affect.     ED Treatments / Results  Labs (all labs ordered are listed, but only abnormal results are displayed) Labs Reviewed  BASIC METABOLIC PANEL - Abnormal; Notable for the following:       Result Value   Chloride 112 (*)    CO2 19 (*)    Glucose, Bld 100 (*)    All other components within normal limits  URINALYSIS, ROUTINE W REFLEX MICROSCOPIC - Abnormal; Notable for the following:    APPearance HAZY (*)    All other components within normal limits  RAPID URINE DRUG SCREEN, HOSP PERFORMED - Abnormal; Notable for the following:    Benzodiazepines POSITIVE (*)    All other components within normal limits  CBC WITH DIFFERENTIAL/PLATELET  ETHANOL  ACETAMINOPHEN LEVEL  SALICYLATE LEVEL  HEPATIC FUNCTION PANEL  I-STAT BETA HCG BLOOD, ED ([facility], [facility], [facility] ONLY)    EKG  EKG Interpretation  Date/Time:  Sunday October 09 2017 14:19:05 EDT Ventricular Rate:  75 PR Interval:      QRS Duration: 102 QT Interval:  384 QTC Calculation: 429 R Axis:   21 Text Interpretation:  Sinus rhythm since last tracing no significant change Confirmed by Malvin Johns [phone]) on 10/09/2017 2:55:19 PM  Radiology Dg Chest 2 View  Result Date: 10/09/2017 CLINICAL DATA:  Short of breath EXAM: CHEST  2 VIEW COMPARISON:  09/18/2017 FINDINGS: The heart size and mediastinal contours are within normal limits. Both lungs are clear. The visualized skeletal structures are unremarkable. IMPRESSION: No active cardiopulmonary disease. Electronically Signed   By: Franchot Gallo M.D.   On: 10/09/2017 15:26    Procedures Procedures (including critical care time)  Medications Ordered in ED Medications  ondansetron (ZOFRAN) injection 4 mg (not administered)  ketorolac (TORADOL) 30 MG/ML injection 30 mg (30 mg Intravenous Given 10/09/17 1540)     Initial Impression / Assessment and Plan / ED Course  I have reviewed the triage vital signs and the nursing notes.  Pertinent labs & imaging results that were available during my care of the patient were reviewed by me and considered in my medical decision making (see chart for details).     Patient presents to the ED with some vague complaints.  She does report some shortness of breath but she has no hypoxia.  No increased work of breathing.  Her lungs are clear.  Her chest x-ray is clear.  Her labs so far are non-concerning.  Her daughter is concerned because she found an empty bottle of Excedrin that was just bought recently on her bed.  Also a bottle of Cogentin that had more pills missing than should be so she is concerned that the patient overdose.  The daughter reports that the patient has been very depressed lately but has not specifically been expressing suicidal ideations or hallucinations.  However given this information, Tylenol aspirin level was added as well as LFTs.  Assuming this is okay, she will need TTS evaluation. Dr. Tyrone Nine to  f/u on labs.  Final Clinical Impressions(s) / ED Diagnoses   Final diagnoses:  Shortness of breath  Fatigue, unspecified type    New Prescriptions New Prescriptions   No medications on file     Malvin Johns, MD 10/09/17 1653

## 2017-10-09 NOTE — ED Notes (Signed)
Pt's daughter took pt's belonging home

## 2017-10-09 NOTE — ED Notes (Signed)
TTS at bedside. 

## 2017-10-09 NOTE — BH Assessment (Signed)
After completing assessment this LPC received a call from Pt's daughter, Jalina Blowers, and her boyfriend, Augusto Garbe, who were both present during tele-assessment. They both report that Pt "was not being honest" during assessment. They say Pt has been depressed due to pending divorced. They report she has frequent crying spells, is eating less and is blaming herself for the marriage ending. Pt's daughter states she bought Pt a bottle of 30 tabs of Tylenol and there are only two tabs left. Pt's acetaminophen is less than 10. Pt's daughter says Pt isn't taking her medications as prescribed. She says Pt has not verbalized suicidal ideation and has not experienced hallucinations over the past week. They are concerned for Pt's wellbeing.  Notified Lindon Romp, NP who recommended Pt be observed overnight for safety and evaluated by psychiatry in the morning. Notified [facility] staff of revised recommendation.   Orpah Greek Anson Fret, LPC, [facility], [place] [facility] Triage Specialist [phone]
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
## 2017-10-09 NOTE — ED Triage Notes (Signed)
Pt arrives from home via [facility] c/o SOB, denies recent LOC, CP, cough, fevers.  Pt c/o ongoing HA x 9 days, denies ETOH, drug use.

## 2017-10-10 ENCOUNTER — Encounter (HOSPITAL_COMMUNITY): Payer: Self-pay | Admitting: *Deleted

## 2017-10-10 ENCOUNTER — Inpatient Hospital Stay (HOSPITAL_COMMUNITY)
Admission: AD | Admit: 2017-10-10 | Discharge: 2017-10-14 | DRG: 885 | Disposition: A | Discharge status: Home / Self Care | Payer: 59 | Source: Intra-hospital | Attending: Psychiatry | Admitting: Psychiatry

## 2017-10-10 DIAGNOSIS — G4700 Insomnia, unspecified: Secondary | ICD-10-CM | POA: Diagnosis present

## 2017-10-10 DIAGNOSIS — Z915 Personal history of self-harm: Secondary | ICD-10-CM | POA: Diagnosis not present

## 2017-10-10 DIAGNOSIS — F251 Schizoaffective disorder, depressive type: Secondary | ICD-10-CM | POA: Diagnosis not present

## 2017-10-10 DIAGNOSIS — Z87891 Personal history of nicotine dependence: Secondary | ICD-10-CM | POA: Diagnosis not present

## 2017-10-10 DIAGNOSIS — D649 Anemia, unspecified: Secondary | ICD-10-CM | POA: Diagnosis present

## 2017-10-10 DIAGNOSIS — F259 Schizoaffective disorder, unspecified: Secondary | ICD-10-CM | POA: Diagnosis present

## 2017-10-10 DIAGNOSIS — Z818 Family history of other mental and behavioral disorders: Secondary | ICD-10-CM

## 2017-10-10 DIAGNOSIS — R443 Hallucinations, unspecified: Secondary | ICD-10-CM | POA: Diagnosis not present

## 2017-10-10 DIAGNOSIS — T43596A Underdosing of other antipsychotics and neuroleptics, initial encounter: Secondary | ICD-10-CM | POA: Diagnosis present

## 2017-10-10 DIAGNOSIS — Z91411 Personal history of adult psychological abuse: Secondary | ICD-10-CM | POA: Diagnosis not present

## 2017-10-10 DIAGNOSIS — F250 Schizoaffective disorder, bipolar type: Principal | ICD-10-CM | POA: Diagnosis present

## 2017-10-10 DIAGNOSIS — Z91410 Personal history of adult physical and sexual abuse: Secondary | ICD-10-CM

## 2017-10-10 DIAGNOSIS — J45909 Unspecified asthma, uncomplicated: Secondary | ICD-10-CM | POA: Diagnosis present

## 2017-10-10 DIAGNOSIS — F411 Generalized anxiety disorder: Secondary | ICD-10-CM | POA: Diagnosis present

## 2017-10-10 DIAGNOSIS — Z881 Allergy status to other antibiotic agents status: Secondary | ICD-10-CM | POA: Diagnosis not present

## 2017-10-10 DIAGNOSIS — R4587 Impulsiveness: Secondary | ICD-10-CM | POA: Diagnosis not present

## 2017-10-10 DIAGNOSIS — R4586 Emotional lability: Secondary | ICD-10-CM | POA: Diagnosis not present

## 2017-10-10 DIAGNOSIS — F4310 Post-traumatic stress disorder, unspecified: Secondary | ICD-10-CM | POA: Diagnosis present

## 2017-10-10 DIAGNOSIS — Z91128 Patient's intentional underdosing of medication regimen for other reason: Secondary | ICD-10-CM | POA: Diagnosis not present

## 2017-10-10 DIAGNOSIS — X58XXXA Exposure to other specified factors, initial encounter: Secondary | ICD-10-CM | POA: Diagnosis present

## 2017-10-10 DIAGNOSIS — Z888 Allergy status to other drugs, medicaments and biological substances status: Secondary | ICD-10-CM

## 2017-10-10 DIAGNOSIS — F410 Panic disorder [episodic paroxysmal anxiety] without agoraphobia: Secondary | ICD-10-CM | POA: Diagnosis present

## 2017-10-10 DIAGNOSIS — R0602 Shortness of breath: Secondary | ICD-10-CM | POA: Diagnosis not present

## 2017-10-10 DIAGNOSIS — T426X6A Underdosing of other antiepileptic and sedative-hypnotic drugs, initial encounter: Secondary | ICD-10-CM | POA: Diagnosis present

## 2017-10-10 DIAGNOSIS — R4582 Worries: Secondary | ICD-10-CM | POA: Diagnosis not present

## 2017-10-10 DIAGNOSIS — R41843 Psychomotor deficit: Secondary | ICD-10-CM | POA: Diagnosis not present

## 2017-10-10 DIAGNOSIS — K219 Gastro-esophageal reflux disease without esophagitis: Secondary | ICD-10-CM | POA: Diagnosis present

## 2017-10-10 DIAGNOSIS — F419 Anxiety disorder, unspecified: Secondary | ICD-10-CM | POA: Diagnosis not present

## 2017-10-10 DIAGNOSIS — R5383 Other fatigue: Secondary | ICD-10-CM | POA: Diagnosis not present

## 2017-10-10 MED ORDER — ACETAMINOPHEN 325 MG PO TABS: 650.0000 mg | ORAL_TABLET | Freq: Four times a day (QID) | ORAL | Status: DC | PRN

## 2017-10-10 MED ORDER — ONDANSETRON 4 MG PO TBDP
4.0000 mg | ORAL_TABLET | Freq: Three times a day (TID) | ORAL | Status: DC | PRN
  Filled 2017-10-10: qty 1

## 2017-10-10 MED ORDER — ONDANSETRON 4 MG PO TBDP: 4.0000 mg | ORAL_TABLET | Freq: Three times a day (TID) | ORAL | Status: DC | PRN

## 2017-10-10 MED ORDER — ALBUTEROL SULFATE HFA 108 (90 BASE) MCG/ACT IN AERS
2.0000 | INHALATION_SPRAY | Freq: Four times a day (QID) | RESPIRATORY_TRACT | Status: DC | PRN
  Filled 2017-10-10: qty 6.7

## 2017-10-10 MED ORDER — MAGNESIUM HYDROXIDE 400 MG/5ML PO SUSP
30.0000 mL | Freq: Every day | ORAL | Status: DC | PRN
Start: 2017-10-10 — End: 2017-10-15

## 2017-10-10 MED ORDER — LURASIDONE HCL 20 MG PO TABS
20.0000 mg | ORAL_TABLET | Freq: Every day | ORAL | Status: DC
  Administered 2017-10-11: 20 mg via ORAL
  Filled 2017-10-10 (×3): qty 1

## 2017-10-10 MED ORDER — NICOTINE 21 MG/24HR TD PT24
21.0000 mg | MEDICATED_PATCH | Freq: Every day | TRANSDERMAL | Status: DC
  Administered 2017-10-11 – 2017-10-14 (×4): 21 mg via TRANSDERMAL
  Filled 2017-10-10 (×8): qty 1

## 2017-10-10 MED ORDER — SERTRALINE HCL 100 MG PO TABS
100.0000 mg | ORAL_TABLET | Freq: Every day | ORAL | Status: DC
  Administered 2017-10-11 – 2017-10-14 (×4): 100 mg via ORAL
  Filled 2017-10-10 (×2): qty 1
  Filled 2017-10-10: qty 2
  Filled 2017-10-10 (×4): qty 1

## 2017-10-10 MED ORDER — TRAZODONE HCL 150 MG PO TABS
300.0000 mg | ORAL_TABLET | Freq: Every evening | ORAL | Status: DC | PRN
Start: 2017-10-10 — End: 2017-10-15
  Administered 2017-10-10 – 2017-10-13 (×4): 300 mg via ORAL
  Filled 2017-10-10 (×4): qty 2

## 2017-10-10 MED ORDER — ALUM & MAG HYDROXIDE-SIMETH 200-200-20 MG/5ML PO SUSP: 30.0000 mL | ORAL | Status: DC | PRN

## 2017-10-10 NOTE — ED Triage Notes (Signed)
Faxed copy of  Voluntary admission and Consent for treatment faxed to [facility].

## 2017-10-10 NOTE — Progress Notes (Signed)
CSW received a phone call from [facility] at [facility]. Per Demetrius Revel wanted to accept the patient for inpatient treatment, however they wanted to make sure the patient was willing to sign herself in voluntarily.   CSW asked for the patient's nurse, Gordan Payment, RN to verify with the patient whether or not she would be willing to go to [facility] for treatment.  Per Luellen Pucker, the patient is refusing to go to [facility] because "she heard very bad things about the facility".  [place] will review the patient for a possible bed. TTS will continue to seek placement.    Radonna Ricker MSW, [place] Disposition [phone]

## 2017-10-10 NOTE — Progress Notes (Signed)
Patient meets criteria for inpatient treatment. CSW faxed referrals to the following inpatient facilities for review:  [facility], [facility], [facility], [facility], [facility], [facility], [place], [facility], [facility], [facility]   TTS will continue to seek bed placement.   Radonna Ricker MSW, [place] Disposition [phone]

## 2017-10-10 NOTE — Progress Notes (Addendum)
Admission note: Admission note:  Patient is a 51 yo female that presented to [facility] via EMS due to shortness of breath.  Patient is anxious and concerned over her medical conditions.  She reports a history of asthma, but feels there is something else wrong.  She denies any suicidal ideations or auditory/visual hallucinations.  She stated to ED personnel that she has experienced auditory hallucinations in the past due to her provider had her on the wrong medications.  Patient denies any alcohol or drug abuse.  She is a smoker.  Patient was positive for benzos, even though she states she has not taken any Xanax for three weeks.  Her BAL was negative.  Patient is separated from her spouse and they are in the process of getting a divorce.  Patient states she "fired" her current psychiatric provider because of incorrect medications. She states "I was on 11 different medications and I stopped all of them." "I live with my daughter and she has to take care of me and I know she's tired." "I have anxiety, PTSD and panic attacks." Patient has several somatic symptoms. She has discharged from [facility] in August 2018.  Patient states she does not leave the house due to her "physical symptoms" which are dizziness, nausea, fatigue and blurred vision.  Patient is anxious; her affect is congruent with mood.  Her thought process is coherent and relevant.  Patient does not appear to be responding to internal stimuli and does not appear to be delusional.  Patient has a med hx of anemia, anginal pain, arthritis, chronic back pain, GERD, migraines and stomach ulcer.    Patient was seeing Zettie Pho and states "I fired her 10 days ago for giving me too much medication."  Patient is currently going through a divorce and resides with her 40 yo daughter here in [place].

## 2017-10-10 NOTE — Progress Notes (Signed)
Adult Psychoeducational Group Note  Date:  10/10/2017 Time:  8:59 PM  Group Topic/Focus:  Wrap-Up Group:   The focus of this group is to help patients review their daily goal of treatment and discuss progress on daily workbooks.  Participation Level:  Active  Participation Quality:  Appropriate  Affect:  Appropriate  Cognitive:  Appropriate  Insight: Appropriate  Engagement in Group:  Engaged  Modes of Intervention:  Discussion  Additional Comments: The patient expressed that she rates today a 7.The  patient also said that her goal was to talk to her daughter.   Nash Shearer 10/10/2017, 8:59 PM

## 2017-10-10 NOTE — ED Notes (Signed)
Declined W/C at D/C and was escorted to lobby by RN. 
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
## 2017-10-10 NOTE — ED Triage Notes (Signed)
PT at staff desk calling daughter to pick her up. Pt reported the Nurse over night told her she could leave in the morning. Pt instructed she needs to talk with [facility]  Before [place]. Pt tearful and returned to room.

## 2017-10-10 NOTE — ED Triage Notes (Signed)
PT at room door reporting her legs are shaking out of control. Pt walking and standing with out difficulty.  DR McKuen notified Pt report.

## 2017-10-10 NOTE — Tx Team (Signed)
Initial Treatment Plan 10/10/2017 6:05 PM [name] [id number]    PATIENT STRESSORS: Health problems Marital or family conflict Medication change or noncompliance Traumatic event   PATIENT STRENGTHS: Capable of independent living Communication skills Supportive family/friends   PATIENT IDENTIFIED PROBLEMS: "work on panic disorders and PTSD"  "try to get weaned off medication the right way"  "Need a new psychiatrist"  Somatic complaints  Feeling of helplessness  Divorce pending  Daughter is caregiver majority of the time         DISCHARGE CRITERIA:  Medical problems require only outpatient monitoring Motivation to continue treatment in a less acute level of care Need for constant or close observation no longer present Verbal commitment to aftercare and medication compliance  PRELIMINARY DISCHARGE PLAN: Outpatient therapy Return to previous living arrangement  PATIENT/FAMILY INVOLVEMENT: This treatment plan has been presented to and reviewed with the patient, Deborah Pena.  The patient and family have been given the opportunity to ask questions and make suggestions.  Zipporah Plants, RN 10/10/2017, 6:05 PM

## 2017-10-10 NOTE — Progress Notes (Signed)
Per Leonia Reader , [facility], patient has been accepted to [name], bed 503-1 ; Accepting provider is Elmarie Shiley, NP; Attending provider is Dr. Parke Poisson.  Patient can arrive at now, the bed is ready.  Number for report is [phone].   Gordan Payment, RN notified.    Radonna Ricker MSW, [place] Disposition [phone]

## 2017-10-11 DIAGNOSIS — R41843 Psychomotor deficit: Secondary | ICD-10-CM

## 2017-10-11 DIAGNOSIS — R450 Nervousness: Secondary | ICD-10-CM

## 2017-10-11 DIAGNOSIS — R4587 Impulsiveness: Secondary | ICD-10-CM

## 2017-10-11 DIAGNOSIS — F4310 Post-traumatic stress disorder, unspecified: Secondary | ICD-10-CM

## 2017-10-11 DIAGNOSIS — R5381 Other malaise: Secondary | ICD-10-CM

## 2017-10-11 DIAGNOSIS — R4582 Worries: Secondary | ICD-10-CM

## 2017-10-11 DIAGNOSIS — F419 Anxiety disorder, unspecified: Secondary | ICD-10-CM

## 2017-10-11 DIAGNOSIS — R443 Hallucinations, unspecified: Secondary | ICD-10-CM

## 2017-10-11 DIAGNOSIS — Z91410 Personal history of adult physical and sexual abuse: Secondary | ICD-10-CM

## 2017-10-11 DIAGNOSIS — Z818 Family history of other mental and behavioral disorders: Secondary | ICD-10-CM

## 2017-10-11 DIAGNOSIS — R0602 Shortness of breath: Secondary | ICD-10-CM

## 2017-10-11 DIAGNOSIS — F250 Schizoaffective disorder, bipolar type: Principal | ICD-10-CM

## 2017-10-11 DIAGNOSIS — G4700 Insomnia, unspecified: Secondary | ICD-10-CM

## 2017-10-11 DIAGNOSIS — Z91411 Personal history of adult psychological abuse: Secondary | ICD-10-CM

## 2017-10-11 DIAGNOSIS — R5383 Other fatigue: Secondary | ICD-10-CM

## 2017-10-11 DIAGNOSIS — R4586 Emotional lability: Secondary | ICD-10-CM

## 2017-10-11 MED ORDER — HYDROXYZINE HCL 50 MG PO TABS
50.0000 mg | ORAL_TABLET | Freq: Four times a day (QID) | ORAL | Status: DC | PRN
  Administered 2017-10-11 – 2017-10-13 (×3): 50 mg via ORAL
  Filled 2017-10-11 (×3): qty 1

## 2017-10-11 MED ORDER — HALOPERIDOL 5 MG PO TABS
5.0000 mg | ORAL_TABLET | Freq: Two times a day (BID) | ORAL | Status: DC
  Administered 2017-10-11 – 2017-10-13 (×4): 5 mg via ORAL
  Filled 2017-10-11 (×6): qty 1

## 2017-10-11 MED ORDER — GABAPENTIN 100 MG PO CAPS
100.0000 mg | ORAL_CAPSULE | Freq: Three times a day (TID) | ORAL | Status: DC
  Administered 2017-10-11 – 2017-10-14 (×9): 100 mg via ORAL
  Filled 2017-10-11 (×17): qty 1

## 2017-10-11 MED ORDER — HALOPERIDOL 5 MG PO TABS: 5.0000 mg | ORAL_TABLET | Freq: Two times a day (BID) | ORAL | Status: DC
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
## 2017-10-11 NOTE — [facility] Group Notes (Signed)
LCSW Group Therapy Note  10/11/2017 1:15pm  Type of Therapy/Topic:  Group Therapy:  Feelings about Diagnosis  Participation Level:  Active   Description of Group:   This group will allow patients to explore their thoughts and feelings about diagnoses they have received. Patients will be guided to explore their level of understanding and acceptance of these diagnoses. Facilitator will encourage patients to process their thoughts and feelings about the reactions of others to their diagnosis and will guide patients in identifying ways to discuss their diagnosis with significant others in their lives. This group will be process-oriented, with patients participating in exploration of their own experiences, giving and receiving support, and processing challenge from other group members.   Therapeutic Goals: 1. Patient will demonstrate understanding of diagnosis as evidenced by identifying two or more symptoms of the disorder 2. Patient will be able to express two feelings regarding the diagnosis 3. Patient will demonstrate their ability to communicate their needs through discussion and/or role play  Summary of Patient Progress:   Was called out at one point to see NP, but did return.  Contributed that she experiences this place as supportive, that she has found a new psychiatrist on her own-it is in the [facility] system, and that she would like to find more support outside of here.  She mentioned both a divorce support group, and the mental health association.    Therapeutic Modalities:   Cognitive Behavioral Therapy Brief Therapy Feelings Identification    [name], LCSW 10/11/2017 2:52 PM
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
## 2017-10-11 NOTE — Progress Notes (Signed)
Adult Psychoeducational Group Note  Date:  10/11/2017 Time:  8:38 PM  Group Topic/Focus:  Wrap-Up Group:   The focus of this group is to help patients review their daily goal of treatment and discuss progress on daily workbooks.  Participation Level:  Active  Participation Quality:  Appropriate  Affect:  Appropriate  Cognitive:    Insight: Appropriate  Engagement in Group:  Engaged  Modes of Intervention:  Discussion  Additional Comments:The patient expressed that attended all groups.The patient also said that her goal was to talk to daughter.  Nash Shearer 10/11/2017, 8:38 PM

## 2017-10-11 NOTE — Progress Notes (Signed)
Patient denies SI, HI, and AVH.  Patient has complained of decreased ability to sleep and increased activity level.  Patient has been compliant with medications and attended groups.   Assess patient for safety, offer medications as prescribed, engage patient in 1:1 staff talks.   Continue to monitor as planned.
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
## 2017-10-11 NOTE — Progress Notes (Signed)
Patient ID: Deborah Pena, female   DOB: April 08, 1966, 51 y.o.   MRN: [id number]  D: Patient in dayroom on approach. Pt telling other patient what to ask their doctors and what labs she recommends they need to have. Pt promised another pt to move in with her after discharge. Pt reports history of PSTD and panic attacks. Pt denies SI/HI/AVH and pain. Pt attended and engaged in evening wrap up group. Cooperative with assessment.  A: Medications administered as prescribed. Support and encouragement provided.  R: Patient remains safe and complaint with medications.
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
## 2017-10-11 NOTE — Progress Notes (Signed)
Recreation Therapy Notes  Date: 10/11/17 Time: 1000 Location: 500 Hall Dayroom  Group Topic: Decision Making, Communication  Goal Area(s) Addresses:  Patient will identify factors that guided their decision making.  Patient will identify benefit of healthy decision making post d/c.   Behavioral Response: Engaged, Attentive  Intervention:  Choices in a Jar Game  Activity: Choices in a Jar.  LRT read a card from the jar.  Each card gave the patients two options.  Patients would have to tell with scenario they chose and why.   Education: Education officer, community, Environmental health practitioner, Discharge Planning   Education Outcome: Acknowledges education  Clinical Observations/Feedback: Pt was engaged.  Pt stated some of the choices weren't fair or not realistic.  Pt stated the activity made her think.  Pt also stated that she has no intuition when it comes to making decisions.  Pt later admitted she doesn't always trust her instincts when it comes to making decisions.   [name], LRT/CTRS         [name] A 10/11/2017 11:57 AM

## 2017-10-12 MED ORDER — NAPHAZOLINE-GLYCERIN 0.012-0.2 % OP SOLN
1.0000 [drp] | Freq: Four times a day (QID) | OPHTHALMIC | Status: DC | PRN
  Administered 2017-10-12: 1 [drp] via OPHTHALMIC
  Filled 2017-10-12: qty 15

## 2017-10-12 MED ORDER — PRAZOSIN HCL 1 MG PO CAPS
1.0000 mg | ORAL_CAPSULE | Freq: Every day | ORAL | Status: DC
  Administered 2017-10-12: 1 mg via ORAL
  Filled 2017-10-12 (×2): qty 1

## 2017-10-12 MED ORDER — ADULT MULTIVITAMIN W/MINERALS CH
1.0000 | ORAL_TABLET | Freq: Every day | ORAL | Status: DC
  Administered 2017-10-12 – 2017-10-14 (×3): 1 via ORAL
  Filled 2017-10-12 (×5): qty 1

## 2017-10-12 MED ORDER — HALOPERIDOL 5 MG PO TABS
2.5000 mg | ORAL_TABLET | Freq: Four times a day (QID) | ORAL | Status: DC | PRN
  Administered 2017-10-12: 2.5 mg via ORAL
  Filled 2017-10-12: qty 1

## 2017-10-12 NOTE — Progress Notes (Signed)
Patient ID: Deborah Pena, female   DOB: 10-Oct-1966, 51 y.o.   MRN: [id number]  D: Patient observed watching TV and interacting well with peers on approach. Pt reports she had a good day. Pt reports she felt like the provider heard her and is looking forward to new medication regimen. Pt is still restless and intrusive but redirectable. Pt attended evening wrap up group and engaged appropriately with peers. Denies  SI/HI/AVH and pain.No behavioral issues noted.  A: Support and encouragement offered as needed to express needs. Medications administered as prescribed.  R: Patient is safe and cooperative on unit. Will continue to monitor  for safety and stability.

## 2017-10-12 NOTE — Progress Notes (Signed)
[facility] MD Progress Note  10/12/2017 12:35 PM Deborah Pena  MRN:  [id number] Subjective:   "I woke up tired, but then felt better a couple hours later." Pt evaluated and chart reviewed. Pt reports that she slept well overnight with combination of trazodone, vistaril, and haldol. She notes that the only thing that disturbed her sleep was nightmares which has been an ongoing problem. She previous took prazosin for them in the past, which was helpful, and she only stopped taking prazosin because she forget to get a refill. She reports that haldol has been helpful for her so far and she has not had any side effects. She notes that haldol has a calming effect. She has improved mood symptoms today. She denies SI/HI. She denies AH/VH. She endorses anxiety about return of her panic attacks, though she has not experienced any panic attacks since being admitted to [facility]. We discussed option of utilizing smaller dose of haldol as needed for panic attacks which she could use in conjunction with vistaril, if needed, and pt was in agreement with that plan. She also requested for a multivitamin and eye drops for eye irritation. She had no further questions, comments, or concerns.   Principal Problem: schizoaffective disorder, bipolar type Diagnosis:   Patient Active Problem List   Diagnosis Date Noted  . Schizoaffective disorder, bipolar type ([place]) [F25.0] 10/10/2017  . 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 ADD (attention deficit disorder) [F98.8] 08/14/2017  . Severe benzodiazepine use disorder ([place]) [F13.20] 08/14/2017  . Alcohol abuse [F10.10] 03/13/2017  . Drug overdose [T50.901A]   . Schizoaffective disorder, depressive type ([place]) [F25.1] 01/16/2017  . PTSD (post-traumatic stress disorder) [F43.10] 01/16/2017  . Panic disorder [F41.0] 01/16/2017  . Acute diverticulitis [K57.92] 04/26/2016  . Diverticulitis of large intestine with abscess without bleeding [K57.20] 04/26/2016  . Intractable migraine  [G43.919] 07/27/2014  . Insomnia [G47.00] 05/26/2013  . Adjustment disorder with anxious mood [F43.22] 12/06/2012  . 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 Anemia   . Anginal pain ([place])   . Anxiety   . Arthritis    "left hand" (04/26/2016)  . Asthma   . Chronic lower back pain   . Diverticulitis of colon   . GERD (gastroesophageal reflux disease)   . Headache    'once or twice/week" (04/26/2016)  . Migraine    "twice/month" (04/26/2016)  . Stomach ulcer     Past Surgical History:  Procedure Laterality Date  . ABDOMINAL HYSTERECTOMY  2013   preformed at [facility] by Dr Jacqualyn Posey in April  . BREAST SURGERY Bilateral ~ 2000   "dual lateral duct removal"   . BUNIONECTOMY Right 2013  . CESAREAN SECTION  1999  . OOPHORECTOMY Right 2013  . TONSILLECTOMY     Family History:  Family History  Problem Relation Age of Onset  . Heart failure Father   . Diabetes Other   . Breast cancer Other   . Diverticulitis Other   . Mental illness Paternal Grandmother   . Mental illness Paternal Grandfather   . Schizophrenia Paternal Aunt   . Suicidality Maternal Aunt    Family Psychiatric  History: see H&P Social History:  History  Alcohol Use No     History  Drug Use No    Social History   Social History  . Marital status: Legally Separated  Spouse name: N/A  . Number of children: N/A  . Years of education: N/A   Social History Main Topics  . Smoking status: Former Smoker    Packs/day: 2.00    Years: 30.00    Types: Cigarettes  . Smokeless tobacco: Former Systems developer    Types: Chew     Comment: "quit smoking cigarettes in ~ 2011; chewed when I was little"  . Alcohol use No  . Drug use: No  . Sexual activity: Not Currently   Other Topics Concern  . None   Social History Narrative  . None    Additional Social History:                         Sleep: Good  Appetite:  Good  Current Medications: Current Facility-Administered Medications  Medication Dose Route Frequency Provider Last Rate Last Dose  . acetaminophen (TYLENOL) tablet 650 mg  650 mg Oral Q6H PRN Niel Hummer, NP      . albuterol (PROVENTIL HFA;VENTOLIN HFA) 108 (90 Base) MCG/ACT inhaler 2 puff  2 puff Inhalation Q6H PRN Niel Hummer, NP      . alum & mag hydroxide-simeth (MAALOX/MYLANTA) 200-200-20 MG/5ML suspension 30 mL  30 mL Oral Q4H PRN Elmarie Shiley A, NP      . gabapentin (NEURONTIN) capsule 100 mg  100 mg Oral TID Maris Berger T, MD   100 mg at 10/12/17 1149  . haloperidol (HALDOL) tablet 2.5 mg  2.5 mg Oral Q6H PRN Maris Berger T, MD   2.5 mg at 10/12/17 1150  . haloperidol (HALDOL) tablet 5 mg  5 mg Oral BID Maris Berger T, MD   5 mg at 10/12/17 0819  . hydrOXYzine (ATARAX/VISTARIL) tablet 50 mg  50 mg Oral Q6H PRN Pennelope Bracken, MD   50 mg at 10/11/17 2113  . magnesium hydroxide (MILK OF MAGNESIA) suspension 30 mL  30 mL Oral Daily PRN Elmarie Shiley A, NP      . multivitamin with minerals tablet 1 tablet  1 tablet Oral Daily Pennelope Bracken, MD   1 tablet at 10/12/17 1151  . naphazoline-glycerin (CLEAR EYES) ophth solution 1-2 drop  1-2 drop Both Eyes QID PRN Pennelope Bracken, MD   1 drop at 10/12/17 1151  . nicotine (NICODERM CQ - dosed in mg/24 hours) patch 21 mg  21 mg Transdermal Q0600 Elmarie Shiley A, NP   21 mg at 10/11/17 0739  . ondansetron (ZOFRAN-ODT) disintegrating tablet 4 mg  4 mg Oral Q8H PRN Elmarie Shiley A, NP      . prazosin (MINIPRESS) capsule 1 mg  1 mg Oral QHS Maris Berger T, MD      . sertraline (ZOLOFT) tablet 100 mg  100 mg Oral Daily Elmarie Shiley A, NP   100 mg at 10/12/17 0818  . traZODone (DESYREL) tablet 300 mg  300 mg Oral QHS PRN Niel Hummer, NP   300 mg at 10/11/17 2113    Lab Results: No  results found for this or any previous visit (from the past 48 hour(s)).  Blood Alcohol level:  Lab Results  Component Value Date   [facility] <10 10/09/2017   ETH <5 79/01/4096    Metabolic Disorder Labs: Lab Results  Component Value Date   HGBA1C 4.6 (L) 08/15/2017   MPG 85.32 08/15/2017   MPG 91 01/17/2017   Lab Results  Component Value Date   PROLACTIN 172.1 (H) 08/15/2017   PROLACTIN  192.0 (H) 01/17/2017   Lab Results  Component Value Date   CHOL 215 (H) 08/15/2017   TRIG 178 (H) 08/15/2017   HDL 69 08/15/2017   CHOLHDL 3.1 08/15/2017   VLDL 36 08/15/2017   LDLCALC 110 (H) 08/15/2017   LDLCALC 88 01/17/2017    Physical Findings: AIMS: Facial and Oral Movements Muscles of Facial Expression: None, normal Lips and Perioral Area: None, normal Jaw: None, normal Tongue: None, normal,Extremity Movements Upper (arms, wrists, hands, fingers): None, normal Lower (legs, knees, ankles, toes): None, normal, Trunk Movements Neck, shoulders, hips: None, normal, Overall Severity Severity of abnormal movements (highest score from questions above): None, normal Incapacitation due to abnormal movements: None, normal Patient's awareness of abnormal movements (rate only patient's report): No Awareness, Dental Status Current problems with teeth and/or dentures?: No Does patient usually wear dentures?: No  CIWA:    COWS:     Musculoskeletal: Strength & Muscle Tone: within normal limits Gait & Station: normal Patient leans: N/A  Psychiatric Specialty Exam: Physical Exam  Nursing note and vitals reviewed.   Review of Systems  Constitutional: Negative for chills and fever.  Eyes: Positive for redness.       Mild seasonal irritation  Respiratory: Negative for cough and shortness of breath.   Cardiovascular: Negative for chest pain.  Psychiatric/Behavioral: Positive for depression. Negative for hallucinations and suicidal ideas. The patient is nervous/anxious.   All other systems  reviewed and are negative.   Blood pressure 127/84, pulse 84, temperature (!) 97.4 F (36.3 C), temperature source Oral, resp. rate 16, height 5\' 2"  (1.575 m), weight 93 kg (205 lb), SpO2 100 %.Body mass index is 37.49 kg/m.  General Appearance: Casual and Fairly Groomed  Eye Contact:  Good  Speech:  Clear and Coherent and Normal Rate  Volume:  Normal  Mood:  Anxious and Euthymic  Affect:  Appropriate, Congruent and Full Range  Thought Process:  Coherent and Goal Directed  Orientation:  Full (Time, Place, and Person)  Thought Content:  Logical, denies SI/HI/AH/VH  Suicidal Thoughts:  No  Homicidal Thoughts:  No  Memory:  Immediate;   Good Recent;   Good Remote;   Good  Judgement:  Fair  Insight:  Fair  Psychomotor Activity:  Normal  Concentration:  Concentration: Good  Recall:  Good  Fund of Knowledge:  Good  Language:  Good  Akathisia:  No  Handed:    AIMS (if indicated):     Assets:  Communication Skills Desire for Improvement Financial Resources/Insurance Resilience Social Support  ADL's:  Intact  Cognition:  WNL  Sleep:  Number of Hours: 6.75     Treatment Plan Summary: Daily contact with patient to assess and evaluate symptoms and progress in treatment and Medication management. Pt shows improvement of insomnia, mood, and psychotic symptoms with initialization of haldol. She continues to express anticipatory anxiety regarding return of another panic attack and we discussed adjusting her PRN regimen to address as needed treatment of anxiety.  -Continue Haldol 5mg  BID - Start haldol 2.5mg  q6h PRN severe anxiety - Continue vistaril 50mg  q6h PRN mild/moderate anxiety - Continue gabapentin 100mg  TID - Start multivitamin once daily - Start Clear Eyes eye drops 1-2 drops per eye QID prn eye irritation - Start prazosin 1mg  qhs for nightmares.  - Continue zoloft 100mg  qDay - Continue trazodone 300mg  qhs PRN insomnia - Encourage participation in groups and therapeutic  milieu. - Discharge planning will be ongoing  Pennelope Bracken, MD 10/12/2017, 12:35 PM

## 2017-10-12 NOTE — Progress Notes (Signed)
Patient reported being able to sleep last night due to medication changes.  Patient currently denies SI, HI and AVH.  Patient has been noted to have a decrease in intrusive behaviors. Patient complained of anxiety and irritability this shift.  Patient has been free of any behavioral dyscontrol.   Assess patient fr safety, offer medications as prescribed, engage patient in 1:1 staff talks,    Patient able to contract for safety, Continue to monitor as planned.

## 2017-10-12 NOTE — Tx Team (Signed)
Interdisciplinary Treatment and Diagnostic Plan Update  10/12/2017 Time of Session: 10:33 AM  Deborah Pena MRN: [id number]  Principal Diagnosis: <principal problem not specified>  Secondary Diagnoses: Active Problems:   Schizoaffective disorder, bipolar type (HCC)   Current Medications:  Current Facility-Administered Medications  Medication Dose Route Frequency Provider Last Rate Last Dose  . acetaminophen (TYLENOL) tablet 650 mg  650 mg Oral Q6H PRN Elmarie Shiley A, NP      . albuterol (PROVENTIL HFA;VENTOLIN HFA) 108 (90 Base) MCG/ACT inhaler 2 puff  2 puff Inhalation Q6H PRN Niel Hummer, NP      . alum & mag hydroxide-simeth (MAALOX/MYLANTA) 200-200-20 MG/5ML suspension 30 mL  30 mL Oral Q4H PRN Elmarie Shiley A, NP      . gabapentin (NEURONTIN) capsule 100 mg  100 mg Oral TID Maris Berger T, MD   100 mg at 10/12/17 0820  . haloperidol (HALDOL) tablet 5 mg  5 mg Oral BID Pennelope Bracken, MD   5 mg at 10/12/17 0819  . hydrOXYzine (ATARAX/VISTARIL) tablet 50 mg  50 mg Oral Q6H PRN Pennelope Bracken, MD   50 mg at 10/11/17 2113  . magnesium hydroxide (MILK OF MAGNESIA) suspension 30 mL  30 mL Oral Daily PRN Elmarie Shiley A, NP      . nicotine (NICODERM CQ - dosed in mg/24 hours) patch 21 mg  21 mg Transdermal Q0600 Elmarie Shiley A, NP   21 mg at 10/11/17 0739  . ondansetron (ZOFRAN-ODT) disintegrating tablet 4 mg  4 mg Oral Q8H PRN Elmarie Shiley A, NP      . sertraline (ZOLOFT) tablet 100 mg  100 mg Oral Daily Elmarie Shiley A, NP   100 mg at 10/12/17 0818  . traZODone (DESYREL) tablet 300 mg  300 mg Oral QHS PRN Niel Hummer, NP   300 mg at 10/11/17 2113    PTA Medications: Prescriptions Prior to Admission  Medication Sig Dispense Refill Last Dose  . acetaminophen (TYLENOL) 325 MG tablet Take 2 tablets (650 mg total) by mouth every 6 (six) hours as needed. For fever/pain (Patient not taking: Reported on 09/19/2017)   Not Taking at Unknown time  . acetaminophen  (TYLENOL) 500 MG tablet Take 1,000 mg by mouth 2 (two) times daily as needed for headache (pain).    10/08/2017 at Unknown time  . albuterol (PROVENTIL HFA;VENTOLIN HFA) 108 (90 Base) MCG/ACT inhaler Inhale 2 puffs into the lungs every 6 (six) hours as needed for wheezing or shortness of breath.   10/08/2017 at pm  . aspirin-acetaminophen-caffeine (EXCEDRIN MIGRAINE) 250-250-65 MG tablet Take 2 tablets by mouth every 6 (six) hours as needed for headache or migraine.   10/08/2017 at Unknown time  . B Complex Vitamins (B COMPLEX-B12 PO) Take 2 tablets by mouth daily.    10/08/2017 at am  . benztropine (COGENTIN) 0.5 MG tablet Take 1 tablet (0.5 mg total) by mouth daily. For prevention of drug induced tremors. (Patient not taking: Reported on 09/19/2017) 30 tablet 0 Not Taking at Unknown time  . benztropine (COGENTIN) 1 MG tablet Take 1 mg by mouth 2 (two) times daily. For prevention of drug induced tremors   10/09/2017 at am  . diphenhydrAMINE (BENADRYL) 25 MG tablet Take 25 mg by mouth 2 (two) times daily as needed (allergic reaction).    couple days ago  . fluticasone (FLONASE) 50 MCG/ACT nasal spray Place 2 sprays into both nostrils every Wednesday.    10/05/2017  . ibuprofen (ADVIL) 200 MG  tablet Take 2 tablets (400 mg total) by mouth every 6 (six) hours as needed (for headaches). 1 tablet 0 1-2 weeks ago  . lamoTRIgine (LAMICTAL) 25 MG tablet Take 1 tablet (25 mg total) by mouth daily. For mood stabilization (Patient not taking: Reported on 09/19/2017) 30 tablet 0 Not Taking at Unknown time  . lurasidone (LATUDA) 20 MG TABS tablet Take 20 mg by mouth daily.   10/09/2017 at am  . mirtazapine (REMERON) 7.5 MG tablet Take 1 tablet (7.5 mg total) by mouth at bedtime. For sleep (Patient not taking: Reported on 09/19/2017) 30 tablet 0 Not Taking at Unknown time  . Multiple Vitamin (MULTIVITAMIN WITH MINERALS) TABS tablet Take 1 tablet by mouth daily. Vitamin supplement   10/09/2017 at am  . nicotine (NICODERM  CQ - DOSED IN MG/24 HOURS) 21 mg/24hr patch Place 1 patch (21 mg total) onto the skin daily at 6 (six) AM. For smoking cessation (Patient not taking: Reported on 09/19/2017) 28 patch 0 Not Taking at Unknown time  . prazosin (MINIPRESS) 1 MG capsule Take 1 capsule (1 mg total) by mouth at bedtime. For PTSD related nightmares (Patient not taking: Reported on 09/20/2017) 30 capsule 0 Not Taking at Unknown time  . sertraline (ZOLOFT) 100 MG tablet Take 100 mg by mouth daily.    10/09/2017 at am  . sertraline (ZOLOFT) 50 MG tablet Take 1 tablet (50 mg total) by mouth daily. For depression (Patient not taking: Reported on 09/19/2017) 30 tablet 0 Not Taking at Unknown time  . Tetrahydrozoline HCl (VISINE OP) Place 1 drop into both eyes daily as needed (dry eyes).   10/08/2017 at Unknown time  . thiamine 100 MG tablet Take 1 tablet (100 mg total) by mouth daily. For low thiamine replacement (Patient not taking: Reported on 10/09/2017) 30 tablet 0 Not Taking at Unknown time  . traZODone (DESYREL) 150 MG tablet Take 300 mg by mouth at bedtime as needed for sleep.   couple nights ago  . ziprasidone (GEODON) 40 MG capsule Take 1 capsule (40 mg total) by mouth daily with supper. For mood control (Patient not taking: Reported on 09/20/2017) 30 capsule 0 Not Taking at Unknown time    Patient Stressors: Health problems Marital or family conflict Medication change or noncompliance Traumatic event  Patient Strengths: Capable of independent living Communication skills Supportive family/friends  Treatment Modalities: Medication Management, Group therapy, Case management,  1 to 1 session with clinician, Psychoeducation, Recreational therapy.   Physician Treatment Plan for Primary Diagnosis: <principal problem not specified> Long Term Goal(s): Improvement in symptoms so as ready for discharge  Short Term Goals: Ability to verbalize feelings will improve Compliance with prescribed medications will improve Ability  to identify and develop effective coping behaviors will improve Compliance with prescribed medications will improve  Medication Management: Evaluate patient's response, side effects, and tolerance of medication regimen.  Therapeutic Interventions: 1 to 1 sessions, Unit Group sessions and Medication administration.  Evaluation of Outcomes: Progressing  Physician Treatment Plan for Secondary Diagnosis: Active Problems:   Schizoaffective disorder, bipolar type ([place])   Long Term Goal(s): Improvement in symptoms so as ready for discharge  Short Term Goals: Ability to verbalize feelings will improve Compliance with prescribed medications will improve Ability to identify and develop effective coping behaviors will improve Compliance with prescribed medications will improve  Medication Management: Evaluate patient's response, side effects, and tolerance of medication regimen.  Therapeutic Interventions: 1 to 1 sessions, Unit Group sessions and Medication administration.  Evaluation of Outcomes:  Progressing   RN Treatment Plan for Primary Diagnosis: <principal problem not specified> Long Term Goal(s): Knowledge of disease and therapeutic regimen to maintain health will improve  Short Term Goals: Ability to identify and develop effective coping behaviors will improve and Compliance with prescribed medications will improve  Medication Management: RN will administer medications as ordered by provider, will assess and evaluate patient's response and provide education to patient for prescribed medication. RN will report any adverse and/or side effects to prescribing provider.  Therapeutic Interventions: 1 on 1 counseling sessions, Psychoeducation, Medication administration, Evaluate responses to treatment, Monitor vital signs and CBGs as ordered, Perform/monitor CIWA, COWS, AIMS and Fall Risk screenings as ordered, Perform wound care treatments as ordered.  Evaluation of Outcomes:  Progressing   LCSW Treatment Plan for Primary Diagnosis: <principal problem not specified> Long Term Goal(s): Safe transition to appropriate next level of care at discharge, Engage patient in therapeutic group addressing interpersonal concerns.  Short Term Goals: Engage patient in aftercare planning with referrals and resources  Therapeutic Interventions: Assess for all discharge needs, 1 to 1 time with Social worker, Explore available resources and support systems, Assess for adequacy in community support network, Educate family and significant other(s) on suicide prevention, Complete Psychosocial Assessment, Interpersonal group therapy.  Evaluation of Outcomes: Met  Return home, follow up [place] in Treatment: Attending groups: Yes Participating in groups: Yes Taking medication as prescribed: Yes Toleration medication: Yes, no side effects reported at this time Family/Significant other contact made: No Patient understands diagnosis: Yes AEB asking to be started on medication that works Discussing patient identified problems/goals with staff: Yes Medical problems stabilized or resolved: Yes Denies suicidal/homicidal ideation: Yes Issues/concerns per patient self-inventory: None Other: N/A  New problem(s) identified: None identified at this time.   New Short Term/Long Term Goal(s): "I stopped all my medications because they were not working, and now I am having withdrawal symptoms."  Discharge Plan or Barriers:   Reason for Continuation of Hospitalization: Anxiety  Hallucinations  Medication stabilization  "Withdrawal symptoms"  Estimated Length of Stay: 10/17/17  Attendees: Patient: Deborah Pena 10/12/2017  10:33 AM  Physician: Maris Berger, MD 10/12/2017  10:33 AM  Nursing: Sena Hitch, RN 10/12/2017  10:33 AM  RN Care Manager: Lars Pinks, RN 10/12/2017  10:33 AM  Social Worker: Ripley Fraise 10/12/2017  10:33 AM   Recreational Therapist: Winfield Cunas 10/12/2017  10:33 AM  Other: Norberto Sorenson 10/12/2017  10:33 AM  Other:  10/12/2017  10:33 AM    Scribe for Treatment Team:  Roque Lias LCSW 10/12/2017 10:33 AM

## 2017-10-12 NOTE — Progress Notes (Signed)
Patient attended group and said that her day was a 8.5.  Her coping skills were socializing and attending groups.

## 2017-10-12 NOTE — [facility] Suicide Risk Assessment (Signed)
[facility] INPATIENT:  Family/Significant Other Suicide Prevention Education  Suicide Prevention Education:  Patient Refusal for Family/Significant Other Suicide Prevention Education: The patient Deborah Pena has refused to provide written consent for family/significant other to be provided Family/Significant Other Suicide Prevention Education during admission and/or prior to discharge.  Physician notified.  [name] 10/12/2017, 10:40 AM

## 2017-10-12 NOTE — Progress Notes (Signed)
Recreation Therapy Notes  Date: 10/12/17 Time: 1000 Location: 500 Hall Dayroom  Group Topic: Stress Management  Goal Area(s) Addresses:  Patient will verbalize importance of using healthy stress management.  Patient will identify positive emotions associated with healthy stress management.   Behavioral Response: Engaged  Intervention: Visual merchandiser, markers  Activity :  Personalized Plates.  Patients were to create a personalized plate where they were to highlight the positive things about themselves.  Patients could highlight things such as biggest accomplishment, important dates, favorite foods, etc.  Education:  Stress Management, Discharge Planning.   Education Outcome: Acknowledges edcuation/In group clarification offered/Needs additional education  Clinical Observations/Feedback:  Pt stated self esteem is affected by "how we think of ourselves".  Pt identified some of the things that make her who she is are her butterfly tattoo, which means "hope and healing", the family cat, trust and her daughter.  Pt explained people tend to focus more on the negative "becasue it's easier".   [name], LRT/CTRS         [name] A 10/12/2017 12:21 PM

## 2017-10-12 NOTE — [facility] Group Notes (Signed)
LCSW Group Therapy Note  10/12/2017 1:15pm  Type of Therapy/Topic:  Group Therapy:  Balance in Life  Participation Level:  Active  Description of Group:    This group will address the concept of balance and how it feels and looks when one is unbalanced. Patients will be encouraged to process areas in their lives that are out of balance and identify reasons for remaining unbalanced. Facilitators will guide patients in utilizing problem-solving interventions to address and correct the stressor making their life unbalanced. Understanding and applying boundaries will be explored and addressed for obtaining and maintaining a balanced life. Patients will be encouraged to explore ways to assertively make their unbalanced needs known to significant others in their lives, using other group members and facilitator for support and feedback.  Therapeutic Goals: 1. Patient will identify two or more emotions or situations they have that consume much of in their lives. 2. Patient will identify signs/triggers that life has become out of balance:  3. Patient will identify two ways to set boundaries in order to achieve balance in their lives:  4. Patient will demonstrate ability to communicate their needs through discussion and/or role plays  Summary of Patient Progress:  Deborah Pena attended group and was there the entire time.  When she is anxious her hands begin to sweat.  She states that she has tried many things to help her with her anxiety and that there is nothing that can help her reduce her symptoms.  She states that the last time she was not anxious was when she was not married and lived on her own.  She often kept herself busy to avoid feeling anxious.  She also enjoys swimming when she is anxious and states that this activity helps her to remain calm.  She appeared happy and contributed a lot towards the group discussion.  She showed no signs of psychosis while in group today.    Therapeutic Modalities:    Cognitive Behavioral Therapy Solution-Focused Therapy Assertiveness Training  Darleen Crocker, [place] Work 10/12/2017 11:43 AM

## 2017-10-13 MED ORDER — HALOPERIDOL 5 MG PO TABS
5.0000 mg | ORAL_TABLET | ORAL | Status: DC
  Administered 2017-10-14: 5 mg via ORAL
  Filled 2017-10-13 (×4): qty 1

## 2017-10-13 MED ORDER — HYDROXYZINE HCL 50 MG PO TABS
50.0000 mg | ORAL_TABLET | Freq: Two times a day (BID) | ORAL | Status: DC
  Administered 2017-10-13 – 2017-10-14 (×3): 50 mg via ORAL
  Filled 2017-10-13 (×8): qty 1

## 2017-10-13 MED ORDER — BENZTROPINE MESYLATE 1 MG PO TABS
1.0000 mg | ORAL_TABLET | Freq: Two times a day (BID) | ORAL | Status: DC
  Administered 2017-10-13 – 2017-10-14 (×3): 1 mg via ORAL
  Filled 2017-10-13 (×8): qty 1

## 2017-10-13 MED ORDER — PRAZOSIN HCL 2 MG PO CAPS
2.0000 mg | ORAL_CAPSULE | Freq: Every day | ORAL | Status: DC
  Administered 2017-10-13: 2 mg via ORAL
  Filled 2017-10-13 (×3): qty 1
  Filled 2017-10-13: qty 2

## 2017-10-13 MED ORDER — HALOPERIDOL 5 MG PO TABS
10.0000 mg | ORAL_TABLET | Freq: Every day | ORAL | Status: DC
  Administered 2017-10-13: 10 mg via ORAL
  Filled 2017-10-13 (×3): qty 2

## 2017-10-13 NOTE — Progress Notes (Signed)
D: Pt at the time of assessment was alert and oriented x 4. Pt was flat and withdrawn to self even while in the dayroom. Pt endorses moderate anxiety and depression; states, "I have been trying to get myself together since my husband decided to leave." Pt at this time however, denies SI, HI, pain or AVH. A: Medications offered as prescribed. Support, encouragement, and safe environment provided.  15-minute safety checks continue. R: Pt was med compliant.  Pt attended wrap-up group. Safety checks continue.

## 2017-10-13 NOTE — Progress Notes (Signed)
Recreation Therapy Notes  Date: 10/13/17 Time: 1000 Location: 500 Hall Dayroom  Group Topic: Life Goals, Goal Setting  Goal Area(s) Addresses:  Patient will be able to identify at least 3 life goals.  Patient will be able to identify benefit of investing in life goals.  Patient will be able to identify benefit of setting life goals.   Behavioral Response:  Engaged  Intervention: Hotel manager  Activity: Goal Planning.  Patients were given a worksheet in which they had to come up with a goal for next week, next month, next year and in five years.  Patients were to then identify the obstacles to reaching those goals, what they need to achieve their goals and what they can begin doing to work towards their goals.  Education:  Discharge Planning, Coping Skills, Life Goals  Education Outcome: Acknowledges Education/In Group Clarification Provided/Needs Additional Education  Clinical Observations:  Pt was bright and active during group.  Pt stated "time limits for goals, gives [place] something to reach for".  Pt also stated when you don't reach your goal, you should "re-evaluate" it.  Pt identified her goals as "go to divorce care and laugh more" within the next week.  In the next month, pt goal is to go the dentist and her daughter's appointments.  In the next, pt wants to "move to the beach, get a job and go to school".  In five years, pt wants to recreate herself and find a purpose.  Pt identified her obstacles as "self-esteem, anxiety, guilt and self sabotage".  To achieve her goals, pt expressed she needed "hope, faith, trust and pixy dust".  Pt stated she could start working towards her goals by "taking things moment by moment".  Pt expressed "keeping them in sight, getting rid of distractions and put your heart into it" will help keep your goals in the forefront.  Pt also stated anything is possible when you have goals and when you don't "you have nothing and it leads you to the  abyss".   [name], LRT/CTRS       Ria Comment, Lennart Gladish A 10/13/2017 11:10 AM

## 2017-10-13 NOTE — [facility] Group Notes (Signed)
LCSW Group Therapy 10/13/2017 1:15pm  Type of Therapy and Topic:  Group Therapy:  Change and Accountability  Participation Level:  Active  Description of Group In this group, patients discussed power and accountability for change.  The group identified the challenges related to accountability and the difficulty of accepting the outcomes of negative behaviors.  Patients were encouraged to openly discuss a challenge/change they could take responsibility for.  Patients discussed the use of "change talk" and positive thinking as ways to support achievement of personal goals.  The group discussed ways to give support and empowerment to peers.  Therapeutic Goals: 1. Patients will state the relationship between personal power and accountability in the change process 2. Patients will identify the positive and negative consequences of a personal choice they have made 3. Patients will identify one challenge/choice they will take responsibility for making 4. Patients will discuss the role of "change talk" and the impact of positive thinking as it supports successful personal change 5. Patients will verbalize support and affirmation of change efforts in peers  Summary of Patient Progress:  Deborah Pena attended group and was there the entire time.  She states that she knows for sure that things are going to be ok.  She also thanked the woman from [facility] for sharing her story with the group.  She stated that she found it inspiring.  Maleigha did not appear to have any symptoms of psychosis.   Therapeutic Modalities Solution Focused Brief Therapy Motivational Interviewing Cognitive Behavioral Therapy  Riley Lam Work 10/13/2017 1:12 PM

## 2017-10-13 NOTE — Progress Notes (Signed)
Patient attended group and said that her day was a 10.  Patient was excited because she will be leaving tomorrow. She continued, the speaker in today's group help her to put things in prospective.

## 2017-10-13 NOTE — Progress Notes (Signed)
Patient denies SI, HI and AVH this shift.  Patient reported feeling shaky and stated she believes that

## 2017-10-13 NOTE — Progress Notes (Signed)
Patient denies SI, HI and AVH.  Patient reports anxiety and shakiness

## 2017-10-13 NOTE — Progress Notes (Signed)
Patient ID: Deborah Pena, female   DOB: 1966/10/28, 51 y.o.   MRN: [id number] [facility] MD Progress Note  10/13/2017 12:19 PM Deborah Pena  MRN:  [id number] Subjective:   "I'm sleeping good but my anxiety is back." Pt evaluated and chart reviewed. Pt reports that she slept well overnight with the exception of nightmares, but she is having increased generalized anxiety which she associates with thinking about her upcoming discharge. She is concerned about having another panic attack. She notes that haldol has been significantly helpful for her since starting it and she asks about increasing the dose. We discussed option of increasing bedtime dose and pt was in agreement. Pt also notes that she feels her anxiety is better with vistaril as a scheduled medications, and pt was in agreement to change vistaril to a scheduled frequency of twice per day with once in the AM and once in the afternoon. We also discussed increasing dose of prazosin to address symptoms of nightmares, and pt was in agreement. She denies SI/HI/AH/VH. She feels that overall her mood and anxiety are significantly improved as compared to time of her initial presentation. She anticipates that she will be ready to discharge to home tomorrow. She had no further questions, comments, or concerns.   Principal Problem: schizoaffective disorder, bipolar type Diagnosis:   Patient Active Problem List   Diagnosis Date Noted  . Schizoaffective disorder, bipolar type ([place]) [F25.0] 10/10/2017  . Hyperprolactinemia ([place]) [E22.1] 08/16/2017  . ADD (attention deficit disorder) [F98.8] 08/14/2017  . Severe benzodiazepine use disorder ([place]) [F13.20] 08/14/2017  . Alcohol abuse [F10.10] 03/13/2017  . Drug overdose [T50.901A]   . Schizoaffective disorder, depressive type ([place]) [F25.1] 01/16/2017  . PTSD (post-traumatic stress disorder) [F43.10] 01/16/2017  . Panic disorder [F41.0] 01/16/2017  . Acute diverticulitis [K57.92] 04/26/2016  . Diverticulitis of  large intestine with abscess without bleeding [K57.20] 04/26/2016  . Intractable migraine [G43.919] 07/27/2014  . Insomnia [G47.00] 05/26/2013  . Adjustment disorder with anxious mood [F43.22] 12/06/2012  . 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 Anemia   . Anginal pain ([place])   . Anxiety   . Arthritis    "left hand" (04/26/2016)  . Asthma   . Chronic lower back pain   . Diverticulitis of colon   . GERD (gastroesophageal reflux disease)   . Headache    'once or twice/week" (04/26/2016)  . Migraine    "twice/month" (04/26/2016)  . Stomach ulcer     Past Surgical History:  Procedure Laterality Date  . ABDOMINAL HYSTERECTOMY  2013   preformed at [facility] by Dr Jacqualyn Posey in April  . BREAST SURGERY Bilateral ~ 2000   "dual lateral duct removal"   . BUNIONECTOMY Right 2013  . CESAREAN SECTION  1999  . OOPHORECTOMY Right 2013  . TONSILLECTOMY     Family History:  Family History  Problem Relation Age of Onset  . Heart failure Father   . Diabetes Other   . Breast cancer Other   . Diverticulitis Other   . Mental illness Paternal Grandmother   . Mental illness Paternal Grandfather   . Schizophrenia Paternal Aunt   . Suicidality Maternal Aunt    Family Psychiatric  History: see H&P Social History:  History  Alcohol Use No     History  Drug Use No    Social History  Social History  . Marital status: Legally Separated    Spouse name: N/A  . Number of children: N/A  . Years of education: N/A   Social History Main Topics  . Smoking status: Former Smoker    Packs/day: 2.00    Years: 30.00    Types: Cigarettes  . Smokeless tobacco: Former Systems developer    Types: Chew     Comment: "quit smoking cigarettes in ~ 2011; chewed when I was little"  . Alcohol use No  . Drug use: No  . Sexual activity:  Not Currently   Other Topics Concern  . None   Social History Narrative  . None   Additional Social History:                         Sleep: Good  Appetite:  Good  Current Medications: Current Facility-Administered Medications  Medication Dose Route Frequency Provider Last Rate Last Dose  . acetaminophen (TYLENOL) tablet 650 mg  650 mg Oral Q6H PRN Elmarie Shiley A, NP      . albuterol (PROVENTIL HFA;VENTOLIN HFA) 108 (90 Base) MCG/ACT inhaler 2 puff  2 puff Inhalation Q6H PRN Niel Hummer, NP      . alum & mag hydroxide-simeth (MAALOX/MYLANTA) 200-200-20 MG/5ML suspension 30 mL  30 mL Oral Q4H PRN Elmarie Shiley A, NP      . gabapentin (NEURONTIN) capsule 100 mg  100 mg Oral TID Pennelope Bracken, MD   100 mg at 10/13/17 0802  . [START ON 10/14/2017] haloperidol (HALDOL) tablet 5 mg  5 mg Oral BH-q7a Pennelope Bracken, MD       And  . haloperidol (HALDOL) tablet 10 mg  10 mg Oral QHS Maris Berger T, MD      . haloperidol (HALDOL) tablet 2.5 mg  2.5 mg Oral Q6H PRN Pennelope Bracken, MD   2.5 mg at 10/12/17 1150  . hydrOXYzine (ATARAX/VISTARIL) tablet 50 mg  50 mg Oral BID Maris Berger T, MD      . magnesium hydroxide (MILK OF MAGNESIA) suspension 30 mL  30 mL Oral Daily PRN Elmarie Shiley A, NP      . multivitamin with minerals tablet 1 tablet  1 tablet Oral Daily Pennelope Bracken, MD   1 tablet at 10/13/17 0802  . naphazoline-glycerin (CLEAR EYES) ophth solution 1-2 drop  1-2 drop Both Eyes QID PRN Pennelope Bracken, MD   1 drop at 10/12/17 1151  . nicotine (NICODERM CQ - dosed in mg/24 hours) patch 21 mg  21 mg Transdermal Q0600 Elmarie Shiley A, NP   21 mg at 10/13/17 0160  . ondansetron (ZOFRAN-ODT) disintegrating tablet 4 mg  4 mg Oral Q8H PRN Elmarie Shiley A, NP      . prazosin (MINIPRESS) capsule 2 mg  2 mg Oral QHS Maris Berger T, MD      . sertraline (ZOLOFT) tablet 100 mg  100 mg Oral Daily Elmarie Shiley A,  NP   100 mg at 10/13/17 0803  . traZODone (DESYREL) tablet 300 mg  300 mg Oral QHS PRN Niel Hummer, NP   300 mg at 10/12/17 2103    Lab Results: No results found for this or any previous visit (from the past 48 hour(s)).  Blood Alcohol level:  Lab Results  Component Value Date   [facility] <10 10/09/2017   ETH <5 10/93/2355    Metabolic Disorder Labs: Lab Results  Component Value Date   HGBA1C  4.6 (L) 08/15/2017   MPG 85.32 08/15/2017   MPG 91 01/17/2017   Lab Results  Component Value Date   PROLACTIN 172.1 (H) 08/15/2017   PROLACTIN 192.0 (H) 01/17/2017   Lab Results  Component Value Date   CHOL 215 (H) 08/15/2017   TRIG 178 (H) 08/15/2017   HDL 69 08/15/2017   CHOLHDL 3.1 08/15/2017   VLDL 36 08/15/2017   LDLCALC 110 (H) 08/15/2017   LDLCALC 88 01/17/2017    Physical Findings: AIMS: Facial and Oral Movements Muscles of Facial Expression: None, normal Lips and Perioral Area: None, normal Jaw: None, normal Tongue: None, normal,Extremity Movements Upper (arms, wrists, hands, fingers): None, normal Lower (legs, knees, ankles, toes): None, normal, Trunk Movements Neck, shoulders, hips: None, normal, Overall Severity Severity of abnormal movements (highest score from questions above): None, normal Incapacitation due to abnormal movements: None, normal Patient's awareness of abnormal movements (rate only patient's report): No Awareness, Dental Status Current problems with teeth and/or dentures?: No Does patient usually wear dentures?: No  CIWA:    COWS:     Musculoskeletal: Strength & Muscle Tone: within normal limits Gait & Station: normal Patient leans: N/A  Psychiatric Specialty Exam: Physical Exam  Nursing note and vitals reviewed.   Review of Systems  Constitutional: Negative for chills and fever.  Eyes: Negative for redness.       Mild seasonal irritation  Respiratory: Negative for cough and shortness of breath.   Cardiovascular: Negative for chest  pain.  Gastrointestinal: Negative for abdominal pain, heartburn, nausea and vomiting.  Skin: Negative for rash.  Psychiatric/Behavioral: Negative for depression, hallucinations and suicidal ideas. The patient is nervous/anxious.   All other systems reviewed and are negative.   Blood pressure (!) 145/89, pulse 84, temperature (!) 97.4 F (36.3 C), temperature source Oral, resp. rate 16, height 5\' 2"  (1.575 m), weight 93 kg (205 lb), SpO2 100 %.Body mass index is 37.49 kg/m.  General Appearance: Casual and Fairly Groomed  Eye Contact:  Good  Speech:  Clear and Coherent and Normal Rate  Volume:  Normal  Mood:  Anxious and Euthymic  Affect:  Appropriate, Congruent and Full Range  Thought Process:  Coherent and Goal Directed  Orientation:  Full (Time, Place, and Person)  Thought Content:  Logical, denies SI/HI/AH/VH  Suicidal Thoughts:  No  Homicidal Thoughts:  No  Memory:  Immediate;   Good Recent;   Good Remote;   Good  Judgement:  Fair  Insight:  Good and Fair  Psychomotor Activity:  Normal  Concentration:  Concentration: Good  Recall:  Good  Fund of Knowledge:  Good  Language:  Good  Akathisia:  No  Handed:    AIMS (if indicated):     Assets:  Communication Skills Desire for Improvement Financial Resources/Insurance Resilience Social Support  ADL's:  Intact  Cognition:  WNL  Sleep:  Number of Hours: 6.75     Treatment Plan Summary: Daily contact with patient to assess and evaluate symptoms and progress in treatment and Medication management. Pt continues to report improvement of insomnia, mood, and psychotic symptoms with initialization of haldol. She continues to struggle with nightmares. She has anticipatory anxiety about her discharge, but she still thinks she will be ready for DC tomorrow.   -Change Haldol 5mg  BID to Haldol 5mg  qAM + 10mg  qhs - Continue haldol 2.5mg  q6h PRN severe anxiety - Change vistaril 50mg  q6h PRN mild/moderate anxiety to vistaril 50mg  BID for  anxiety - Continue gabapentin 100mg  TID - Change prazosin  1mg  qhs for nightmares to prazosin 2mg  qhs.  - Continue zoloft 100mg  qDay - Continue trazodone 300mg  qhs PRN insomnia - Encourage participation in groups and therapeutic milieu. - Discharge planning will be ongoing  Pennelope Bracken, MD 10/13/2017, 12:19 PM

## 2017-10-14 MED ORDER — NICOTINE 21 MG/24HR TD PT24: 21.0000 mg | MEDICATED_PATCH | Freq: Every day | TRANSDERMAL | 0 refills | Status: DC

## 2017-10-14 MED ORDER — SERTRALINE HCL 100 MG PO TABS: 100.0000 mg | ORAL_TABLET | Freq: Every day | ORAL | 0 refills | Status: DC

## 2017-10-14 MED ORDER — ACETAMINOPHEN 325 MG PO TABS: 650.0000 mg | ORAL_TABLET | Freq: Four times a day (QID) | ORAL | Status: DC | PRN

## 2017-10-14 MED ORDER — HALOPERIDOL 5 MG PO TABS: ORAL_TABLET | ORAL | 0 refills | Status: DC

## 2017-10-14 MED ORDER — GABAPENTIN 100 MG PO CAPS: 100.0000 mg | ORAL_CAPSULE | Freq: Three times a day (TID) | ORAL | 0 refills | Status: DC

## 2017-10-14 MED ORDER — TRAZODONE HCL 300 MG PO TABS: 300.0000 mg | ORAL_TABLET | Freq: Every evening | ORAL | 0 refills | Status: DC | PRN

## 2017-10-14 MED ORDER — ALBUTEROL SULFATE HFA 108 (90 BASE) MCG/ACT IN AERS: 2.0000 | INHALATION_SPRAY | Freq: Four times a day (QID) | RESPIRATORY_TRACT | Status: AC | PRN

## 2017-10-14 MED ORDER — BENZTROPINE MESYLATE 1 MG PO TABS: 1.0000 mg | ORAL_TABLET | Freq: Two times a day (BID) | ORAL | 0 refills | Status: DC

## 2017-10-14 MED ORDER — ADULT MULTIVITAMIN W/MINERALS CH: 1.0000 | ORAL_TABLET | Freq: Every day | ORAL | Status: AC

## 2017-10-14 MED ORDER — NAPHAZOLINE-GLYCERIN 0.012-0.2 % OP SOLN: 1.0000 [drp] | Freq: Four times a day (QID) | OPHTHALMIC | 0 refills | Status: DC | PRN

## 2017-10-14 MED ORDER — PRAZOSIN HCL 2 MG PO CAPS: 2.0000 mg | ORAL_CAPSULE | Freq: Every day | ORAL | 0 refills | Status: DC

## 2017-10-14 MED ORDER — HYDROXYZINE HCL 50 MG PO TABS: 50.0000 mg | ORAL_TABLET | Freq: Two times a day (BID) | ORAL | 0 refills | Status: DC

## 2017-10-14 NOTE — Progress Notes (Signed)
Recreation Therapy Notes  Date: 10/14/17 Time: 1000 Location:  [address]  Group Topic: Communication  Goal Area(s) Addresses:  Patient will effectively communicate with peers in group.  Patient will verbalize benefit of healthy communication. Patient will verbalize positive effect of healthy communication on post d/c goals.  Patient will identify communication techniques that made activity effective for group.   Behavioral Response: Engaged  Intervention:  Conservation officer, nature.  Patients were given one rubber disc each, plus one extra for the group.  Patients were to use the discs to travel as a group from one end of the hall to the other and back.  If any person stepped off their disc, the group would have to start from the beginning.    Education:Communication, Discharge Planning  Education Outcome: Acknowledges understanding/In group clarification offered/Needs additional education.   Clinical Observations/Feedback: Pt was active during activity.  Pt did get confused about how the group was to use the discs to maneuver down the hall.  Once the group figured out a plan, pt became more relaxed and focused.   [name], LRT/CTRS        [name] A 10/14/2017 12:10 PM

## 2017-10-14 NOTE — Discharge Summary (Signed)
Physician Discharge Summary Note  Patient:  Deborah Pena is an 51 y.o., female MRN:  [id number] DOB:  [birth year] Patient phone:  [phone] (home)  Patient address:   [place] [address],  Total Time spent with patient: Greater than 30 minutes  Date of Admission:  10/10/2017 Date of Discharge: 10-14-17  Reason for Admission: Worsening psychosis.  Principal Problem: Schizoaffective disorder, bipolar-type  Discharge Diagnoses: Patient Active Problem List   Diagnosis Date Noted  . Schizoaffective disorder, bipolar type ([place]) [F25.0] 10/10/2017  . 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 ADD (attention deficit disorder) [F98.8] 08/14/2017  . Severe benzodiazepine use disorder ([place]) [F13.20] 08/14/2017  . Alcohol abuse [F10.10] 03/13/2017  . Drug overdose [T50.901A]   . Schizoaffective disorder, depressive type ([place]) [F25.1] 01/16/2017  . PTSD (post-traumatic stress disorder) [F43.10] 01/16/2017  . Panic disorder [F41.0] 01/16/2017  . Acute diverticulitis [K57.92] 04/26/2016  . Diverticulitis of large intestine with abscess without bleeding [K57.20] 04/26/2016  . Intractable migraine [G43.919] 07/27/2014  . Insomnia [G47.00] 05/26/2013  . Adjustment disorder with anxious mood [F43.22] 12/06/2012  . 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 Anemia   . Anginal pain ([place])   . Anxiety   . Arthritis    "left hand" (04/26/2016)  . Asthma   . Chronic lower back pain   . Diverticulitis of colon   . GERD (gastroesophageal reflux disease)   . Headache    'once or twice/week" (04/26/2016)  . Migraine    "twice/month" (04/26/2016)  . Stomach ulcer     Past Surgical History:  Procedure Laterality Date  . ABDOMINAL HYSTERECTOMY  2013   preformed at [facility] by Deborah Jacqualyn Posey in  April  . BREAST SURGERY Bilateral ~ 2000   "dual lateral duct removal"   . BUNIONECTOMY Right 2013  . CESAREAN SECTION  1999  . OOPHORECTOMY Right 2013  . TONSILLECTOMY     Family History:  Family History  Problem Relation Age of Onset  . Heart failure Father   . Diabetes Other   . Breast cancer Other   . Diverticulitis Other   . Mental illness Paternal Grandmother   . Mental illness Paternal Grandfather   . Schizophrenia Paternal Aunt   . Suicidality Maternal Aunt    Family Psychiatric  History: See H&P  Social History:  History  Alcohol Use No     History  Drug Use No    Social History   Social History  . Marital status: Legally Separated    Spouse name: N/A  . Number of children: N/A  . Years of education: N/A   Social History Main Topics  . Smoking status: Former Smoker    Packs/day: 2.00    Years: 30.00    Types: Cigarettes  . Smokeless tobacco: Former Systems developer    Types: Chew     Comment: "quit smoking cigarettes in ~ 2011; chewed when I was little"  . Alcohol use No  . Drug use: No  . Sexual activity: Not Currently   Other Topics Concern  . None   Social History Narrative  . None   Hospital Course: (Per admission assessment): This is an admission assessment for this 51 year old Caucasian female with hx of Schizoaffective disorder. She is known in this [facility] from previous hospitalizations for mood stabilization treatments. She was recently discharged from  this hospital on 01-21-17 & 08-18-17 respectively with a recommendation & an appointment to follow-up care on an outpatient basis. Her UDS was positive for Benzodiazepine. She does not present as if under the influence of any substances. However, she presents manic, labile, tangential with pressured speech.  During this assessment, Deborah Pena reports, "I came here from the [facility] ED. I went to this hospital 2 days ago by an ambulance. My daughter called the 911 because I was having shortness of  breath, I could not move, I was also having bad tremors. I believe that these symptoms were as a result of withdrawal symptoms from my psych medications. I recently fired my psychiatrist. It was 2 weeks ago I did that because she is incompetent. She did not inform me of all the side effects of the psychiatric medications that she put me on. I was with her for 10 years. I trusted her. I took the medications that she gave me. Each time I saw her, she will change my medications, until I ended up with 11 psychiatric medications all together. I cannot remember all their names but Geodon & Lamictal. The Lamictal gave me a bad rash to my chest & abdominal areas. The Geodon made me gain a lot of weight & caused me to have hallucinations, I was tired all the time, got more depressed & my liver almost failed. I stopped taking these medications about a week & half ago. After I stopped these medicines, the panic attacks started & lasted for hours, then I passed out because my throat closed up & I could not move the air. My daughter & I looked up these medications & realized that I was reacting to all of them. That was why we trashed all of them. I hear music playing all the time when there is no real music on the background. I don't sleep at all except with Ambien & Trazodone. I'm not mentally ill. I'm just suffering from stress related to my marriage separation, pending divorce & having to take care of my 47 year old daughter who also is mentally ill. I was told that I have Schizoaffective disorder which is stress related & not mental illness".   Deborah Pena was recently discharged from this hospital on 08-18-17 after mood stabilization treatments. She was discharged to follow-up care on an outpatient basis. However, she was re-admitted to the hospital for mood stabilization treatments. Her reports indicated that she may not have been compliant with treatment regimen. She was having worseninmg psychosis & was manic on  presentation.  After evaluation of her presenting symptoms, her presenting symptoms were identified. The medication regimen targeting those symptoms were initiated. Her UDS on admission was positive for Benzodiazepine & BAL was <10 per toxicology reports. She presented no substance withdrawal symptoms.  Deborah Pena was medicated & discharged on; Cogentin 1 mg for EPS, Gabapentin 100 mg for agitation, Trazodone 300 mg for insomnia, Nicotine patch 21 mg for nicotine withdrawal symptoms, Prazosin 2 mg for nightmares, Haldol 5 mg & 10 mg Q am & bedtime respectively for mood control, Hydroxyzine 50 mg prn for anxiety & Sertraline 100 mg for depression. She was enrolled & encouraged to participate in the group counseling sessions being offered & held on this unit.  she did & learned coping skills. She presented other significant health issues that required treatment or monitoring. She was resumed & discharged on those home medications for those health issues. She tolerated her treatment regimen without any adverse effects or  reactions.  Deborah Pena symptoms responded well to her treatment regimen combined with a therapeutic and supportive environment. This is evidenced by her daily reports of improved mood, absence of mania & or psychosis. She was evaluated today by the attending psychiatrist for discharge evaluation & Deborah Pena reports that she is feeling well & ready to be discharged to continue mental health care on an outpatient basis as noted below.   Upon her hospital discharge, Deborah Pena is in much improved condition than upon admission.Her symptoms were reported as significantly decreased or resolved completely. She adamantly denies any SI/HI,  AVH, delusional thoughts & or paranoia. She was motivated to continue taking medication with a goal of continued improvement in mental health. She is provided with all the necessary information required to make this outpatient psychiatric appointment without problems.  She left [facility]  with all personal belongings in no apparent distress. Transportation family.  Physical Findings: AIMS: Facial and Oral Movements Muscles of Facial Expression: None, normal Lips and Perioral Area: None, normal Jaw: None, normal Tongue: None, normal,Extremity Movements Upper (arms, wrists, hands, fingers): None, normal Lower (legs, knees, ankles, toes): None, normal, Trunk Movements Neck, shoulders, hips: None, normal, Overall Severity Severity of abnormal movements (highest score from questions above): None, normal Incapacitation due to abnormal movements: None, normal Patient's awareness of abnormal movements (rate only patient's report): No Awareness, Dental Status Current problems with teeth and/or dentures?: No Does patient usually wear dentures?: No  CIWA:    COWS:     Musculoskeletal: Strength & Muscle Tone: within normal limits Gait & Station: normal Patient leans: N/A  Psychiatric Specialty Exam: Physical Exam  Constitutional: She appears well-developed and well-nourished.  HENT:  Head: Normocephalic.  Eyes: Pupils are equal, round, and reactive to light.  Neck: Normal range of motion.  Cardiovascular: Normal rate.   Respiratory: Effort normal.  GI: Soft.  Genitourinary:  Genitourinary Comments: Deferred  Musculoskeletal: Normal range of motion.  Neurological: She is alert.  Skin: Skin is warm.    Review of Systems  Constitutional: Negative.   HENT: Negative.   Eyes: Negative.   Cardiovascular: Negative.   Gastrointestinal: Negative.   Genitourinary: Negative.   Musculoskeletal: Negative.   Skin: Negative.   Neurological: Negative.   Endo/Heme/Allergies: Negative.   Psychiatric/Behavioral: Positive for depression (Stabilized with medication prior to discharge), hallucinations (Hx. Psychosis) and substance abuse (Hx. Alcohol, Amphetamine/Benzodiazepine use disorder.  ). Negative for memory loss and suicidal ideas. The patient has insomnia (Stabilized with  medication prior to discharge). The patient is not nervous/anxious.     Blood pressure (!) 100/52, pulse (!) 107, temperature 98.3 F (36.8 C), resp. rate 16, height 5\' 2"  (1.575 m), weight 93 kg (205 lb), SpO2 100 %.Body mass index is 37.49 kg/m.  See Md's SRA  Have you used any form of tobacco in the last 30 days? (Cigarettes, Smokeless Tobacco, Cigars, and/or Pipes): No  Has this patient used any form of tobacco in the last 30 days? (Cigarettes, Smokeless Tobacco, Cigars, and/or Pipes): Yes, provided with a nicotine patch prescription 21 mg Q 24 hours for smoking cessation.  Blood Alcohol level:  Lab Results  Component Value Date   ETH <10 10/09/2017   ETH <5 00/34/9179   Metabolic Disorder Labs:  Lab Results  Component Value Date   HGBA1C 4.6 (L) 08/15/2017   MPG 85.32 08/15/2017   MPG 91 01/17/2017   Lab Results  Component Value Date   PROLACTIN 172.1 (H) 08/15/2017   PROLACTIN 192.0 (H) 01/17/2017  Lab Results  Component Value Date   CHOL 215 (H) 08/15/2017   TRIG 178 (H) 08/15/2017   HDL 69 08/15/2017   CHOLHDL 3.1 08/15/2017   VLDL 36 08/15/2017   LDLCALC 110 (H) 08/15/2017   [place] 88 01/17/2017   See Psychiatric Specialty Exam and Suicide Risk Assessment completed by Attending Physician prior to discharge.  Discharge destination:  Home  Is patient on multiple antipsychotic therapies at discharge:  No   Has Patient had three or more failed trials of antipsychotic monotherapy by history:  No  Recommended Plan for Multiple Antipsychotic Therapies: NA  Allergies as of 10/14/2017      Reactions   Lamictal [lamotrigine] Shortness Of Breath, Rash, Other (See Comments)   Swollen lymph nodes   Buprenorphine Hcl Other (See Comments)   "Becomes psychotic"--per notes from another healthcare network   Ciprofloxacin Itching, Other (See Comments)   Sore throat, hoarseness   Propranolol Other (See Comments)   Syncope      Medication List    STOP taking these  medications   aspirin-acetaminophen-caffeine 250-250-65 MG tablet Commonly known as:  EXCEDRIN MIGRAINE   B COMPLEX-B12 PO   BENADRYL 25 MG tablet Generic drug:  diphenhydrAMINE   fluticasone 50 MCG/ACT nasal spray Commonly known as:  FLONASE   ibuprofen 200 MG tablet Commonly known as:  ADVIL   lamoTRIgine 25 MG tablet Commonly known as:  LAMICTAL   LATUDA 20 MG Tabs tablet Generic drug:  lurasidone   mirtazapine 7.5 MG tablet Commonly known as:  REMERON   thiamine 100 MG tablet   VISINE OP   ziprasidone 40 MG capsule Commonly known as:  GEODON     TAKE these medications     Indication  acetaminophen 325 MG tablet Commonly known as:  TYLENOL Take 2 tablets (650 mg total) by mouth every 6 (six) hours as needed for mild pain. What changed:  reasons to take this  additional instructions  Another medication with the same name was removed. Continue taking this medication, and follow the directions you see here.  Indication:  Fever, Pain   albuterol 108 (90 Base) MCG/ACT inhaler Commonly known as:  PROVENTIL HFA;VENTOLIN HFA Inhale 2 puffs into the lungs every 6 (six) hours as needed for wheezing or shortness of breath.  Indication:  Asthma   benztropine 1 MG tablet Commonly known as:  COGENTIN Take 1 tablet (1 mg total) by mouth 2 (two) times daily. For prevention of drug induced tremors What changed:  Another medication with the same name was removed. Continue taking this medication, and follow the directions you see here.  Indication:  Extrapyramidal Reaction caused by Medications   gabapentin 100 MG capsule Commonly known as:  NEURONTIN Take 1 capsule (100 mg total) by mouth 3 (three) times daily. For agitation  Indication:  Agitation   haloperidol 5 MG tablet Commonly known as:  HALDOL Take 1 tablet (5 mg) by mouth in morning & 2 tablets (10 mg) at bedtime: For mood control  Indication:  Mood control   hydrOXYzine 50 MG tablet Commonly known as:   ATARAX/VISTARIL Take 1 tablet (50 mg total) by mouth 2 (two) times daily. For anxiety  Indication:  Feeling Anxious   multivitamin with minerals Tabs tablet Take 1 tablet by mouth daily. Vitamin supplement  Indication:  Vitamin supplement   naphazoline-glycerin 0.012-0.2 % Soln Commonly known as:  CLEAR EYES Place 1-2 drops into both eyes 4 (four) times daily as needed for eye irritation.  Indication:  Red Eyes   nicotine 21 mg/24hr patch Commonly known as:  NICODERM CQ - dosed in mg/24 hours Place 1 patch (21 mg total) onto the skin daily at 6 (six) AM. (May purchase from over the counter at the pharmacy): For smoking cessation What changed:  additional instructions  Indication:  Nicotine Addiction   prazosin 2 MG capsule Commonly known as:  MINIPRESS Take 1 capsule (2 mg total) by mouth at bedtime. For PTSD related nightmares What changed:  medication strength  how much to take  Indication:  Nightmares   sertraline 100 MG tablet Commonly known as:  ZOLOFT Take 1 tablet (100 mg total) by mouth daily. For depression What changed:  additional instructions  Another medication with the same name was removed. Continue taking this medication, and follow the directions you see here.  Indication:  Major Depressive Disorder   trazodone 300 MG tablet Commonly known as:  DESYREL Take 1 tablet (300 mg total) by mouth at bedtime as needed for sleep. What changed:  medication strength  Indication:  Deborah Pena Follow up on 11/03/2017.   Specialty:  Behavioral Health Why:  Thursday at 1:00 to see Deborah Pena. Also, Monday Nov 19th at 1:45 with Deborah Pena Finally, check out the [place] at [address] in the lower level for groups that you might enjoy, and as a way of getting out of the house Contact information: [address] [place] [phone]         Follow-up recommendations: Activity:  As tolerated Diet: As recommended by your primary care doctor. Keep all scheduled follow-up appointments as recommended.  Comments: Patient is instructed prior to discharge to: Take all medications as prescribed by his/her mental healthcare provider. Report any adverse effects and or reactions from the medicines to his/her outpatient provider promptly. Patient has been instructed & cautioned: To not engage in alcohol and or illegal drug use while on prescription medicines. In the event of worsening symptoms, patient is instructed to call the crisis hotline, 911 and or go to the nearest ED for appropriate evaluation and treatment of symptoms. To follow-up with his/her primary care provider for your other medical issues, concerns and or health care needs.    Signed: Encarnacion Slates, NP, PMHNP, FNP-BC 10/14/2017, 10:10 AM   Patient seen, Suicide Assessment Completed.  Disposition Plan Reviewed   - Parisa Pinela is a 51 y/o F with history of schizoaffective disorder bipolar type who presented with worsening anxiety, depression, and psychosis. She had stopped multiple medications in the recent weeks. She felt that she was having side effect of not being able to breath which also increased her anxiety symptoms. She was medically cleared and transferred to [facility]. She reported significantly poor sleep. Pt was attempted on trial of haldol which she reported was helpful for her in the past. Pt tolerated medication changes and reported improvement of her mood and anxiety symptoms. Today she denied SI/HI/AH/VH. Her sleep has improved significantly but she continues to complain of nightmares related to previous trauma. She felt that she would be safe to discharge and she was able to engage in safety planning including returning to [facility] or calling 911 if she felt unable to maintain her own safety. She had no further questions, comments,  or concerns.   Plan Of Care/Follow-up recommendations:  - Schizoaffective disorder, bipolar type             -  haldol 5mg  qAM + 10mg  qhs             - zoloft 100mg  qDay - Anxiety associated with PTSD             - atarax 50mg  po BID             - haldol 2.5mg  q6h prn severe anxiety - Extrapyramidal symptoms             - cogentin 1mg  BID - Nightmares associated with PTSD             - prazosin 2mg  qhs - Insomnia             - trazodone 300mg  qhs prn insomnia  Activity:  as tolerated Diet:  normal Tests:  N/A Other:  see above for DC plan  Pennelope Bracken, MD

## 2017-10-14 NOTE — Progress Notes (Signed)
Recreation Therapy Notes  INPATIENT RECREATION TR PLAN  Patient Details Name: Christyana Corwin MRN: [id number] DOB: 08-04-1966 Today's Date: 10/14/2017  Rec Therapy Plan Is patient appropriate for Therapeutic Recreation?: Yes Treatment times per week: about 3 days Estimated Length of Stay: 5-7 days TR Treatment/Interventions: Group participation (Comment)  Discharge Criteria Pt will be discharged from therapy if:: Discharged Treatment plan/goals/alternatives discussed and agreed upon by:: Patient/family  Discharge Summary Short term goals set: Patient will be able to identify at least 5 coping skills for panic attacks by conclusion of recreation therapy tx  Short term goals met: Complete Progress toward goals comments: Groups attended Which groups?: Self-esteem, Goal setting, Communication (Desicion Making) Reason goals not met: None Therapeutic equipment acquired: N/A Reason patient discharged from therapy: Discharge from hospital Pt/family agrees with progress & goals achieved: Yes Date patient discharged from therapy: 10/14/17    [name], LRT/CTRS   Ria Comment, [place] 10/14/2017, 12:44 PM

## 2017-10-14 NOTE — Progress Notes (Signed)
D: Patient verbalizes readiness for discharge. Denies suicidal and homicidal ideations. Denies auditory and visual hallucinations.  No complaints of pain.  A:  Patient receptive to discharge instructions. Questions encouraged, verbalizes understanding. Discussed the importance of attending groups for support when she is home, she acknowledged the importance and agreed to do so.  R:  Escorted to the lobby by this RN.

## 2017-10-14 NOTE — Progress Notes (Signed)
  [facility] Adult Case Management Discharge Plan :  Will you be returning to the same living situation after discharge:  Yes,  Home At discharge, do you have transportation home?: Yes,  Family  Do you have the ability to pay for your medications: Yes,  Husband income   Release of information consent forms completed and in the chart;  Patient's signature needed at discharge.  Patient to Follow up at: Follow-up Information    [facility] Follow up on 11/03/2017.   Specialty:  Behavioral Health Why:  Thursday at 1:00 to see Dr Modesta Messing. Also, Monday Nov 19th at 1:45 with Arrie [facility] Finally, check out the [place] at [address] in the lower level for groups that you might enjoy, and as a way of getting out of the house Contact information: [address] [place] [phone]          Next level of care provider has access to [place] and Suicide Prevention discussed: Yes,  Yes  Have you used any form of tobacco in the last 30 days? (Cigarettes, Smokeless Tobacco, Cigars, and/or Pipes): No  Has patient been referred to the Quitline?: N/A patient is not a smoker  Patient has been referred for addiction treatment: [place], Student-Social Work 10/14/2017, 12:58 PM

## 2017-10-14 NOTE — Progress Notes (Signed)
D: Pt observed in bed resting. Pt at the time of assessment endorsed moderate anxiety; "I will be leaving tomorrow; I am a little nervous." Pt denied SI, HI, pain or AVH. A: Medications offered as prescribed. Support, encouragement, and safe environment provided.  15-minute safety checks continue. R: Pt was med compliant.  Pt attended wrap-up group. Safety checks continue.

## 2017-10-14 NOTE — Plan of Care (Signed)
Problem: [facility] Participation in Recreation Therapeutic Interventions Goal: STG-Patient will identify at least five coping skills for ** STG: Coping Skills - Patient will be able to identify at least 5 coping skills for panic attacks by conclusion of recreation therapy tx  Outcome: Completed/Met Date Met: 10/14/17 Pt was able to identify coping skills at completion of goal setting recreation therapy session.  [name], LRT/CTRS

## 2017-10-14 NOTE — BHH Suicide Risk Assessment (Signed)
[name]   Principal Problem: Schizoaffective disorder, bipolar type [name] Discharge Diagnoses:  Patient Active Problem List   Diagnosis Date Noted  . Schizoaffective disorder, bipolar type ([name]) [F25.0] 10/10/2017  . Hyperprolactinemia ([name]) [E22.1] 08/16/2017  . ADD (attention deficit disorder) [F98.8] 08/14/2017  . Severe benzodiazepine use disorder ([place]) [F13.20] 08/14/2017  . Alcohol abuse [F10.10] 03/13/2017  . Drug overdose [T50.901A]   . Schizoaffective disorder, depressive type ([name]) [F25.1] 01/16/2017  . PTSD (post-traumatic stress disorder) [F43.10] 01/16/2017  . Panic disorder [F41.0] 01/16/2017  . Acute diverticulitis [K57.92] 04/26/2016  . Diverticulitis of large intestine with abscess without bleeding [K57.20] 04/26/2016  . Intractable migraine [G43.919] 07/27/2014  . Insomnia [G47.00] 05/26/2013  . Adjustment disorder with anxious mood [F43.22] 12/06/2012  . 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  Respiratory: Negative for cough.   Cardiovascular: Negative for chest pain.  Gastrointestinal: Negative for heartburn, nausea and vomiting.  Neurological: Negative for dizziness.  Psychiatric/Behavioral: Negative for depression, substance abuse and suicidal ideas.    Blood pressure (!) 100/52, pulse (!) 107, temperature 98.3 F (36.8 C), resp. rate 16, height 5\' 2"  (1.575 m), weight 93 kg (205 lb), SpO2 100 %.Body mass index is 37.49 kg/m.  General Appearance: Casual  Eye Contact::  Good  Speech:  Clear and Coherent and Normal Rate  Volume:  Normal  Mood:  Anxious and Euthymic  Affect:  Congruent  Thought Process:  Coherent and Goal Directed   Orientation:  Full (Time, Place, and Person)  Thought Content:  Logical  Suicidal Thoughts:  No  Homicidal Thoughts:  No  Memory:  Immediate;   Good Recent;   Good Remote;   Good  Judgement:  Good  Insight:  Good  Psychomotor Activity:  Normal  Concentration:  Good  Recall:  Good  Fund of Knowledge:Good  Language: Good  Akathisia:  No  Handed:    AIMS (if indicated):     Assets:  Communication Skills Desire for Improvement Financial Resources/Insurance Housing Resilience Social Support  Sleep:  Number of Hours: 6.5  Cognition: WNL  ADL's:  Intact   Mental Status Per Nursing Assessment::   On Admission:  NA  Demographic Factors:  Divorced or widowed, Caucasian and Unemployed  Loss Factors: Financial problems/change in socioeconomic status  Historical Factors: Prior suicide attempts and Family history of mental illness or substance abuse  Risk Reduction Factors:   Sense of responsibility to family, Positive social support, Positive therapeutic relationship and Positive coping skills or problem solving skills  Continued Clinical Symptoms:  Severe Anxiety and/or Agitation  Cognitive Features That Contribute To Risk:  None    Suicide Risk:  Mild:  Suicidal ideation of limited frequency, intensity, duration, and specificity.  There are no identifiable plans, no associated intent, mild dysphoria and related symptoms, good self-control (both objective and subjective assessment), few other risk factors, and identifiable protective factors, including available and accessible social support.  Follow-up Information    [facility] Follow up on 11/03/2017.   Specialty:  Behavioral Health Why:  Thursday at 1:00 to see Dr Modesta Messing. Also, Monday Nov 19th at 1:45 with Arrie [facility] Finally, check out the [facility] at [address] in the lower level for  groups that you might enjoy, and as a way of getting out of  the house Contact information: [address] Deborah Pena [phone]         Subjective data: - Deborah Pena is a 51 y/o F with history of schizoaffective disorder bipolar type who presented with worsening anxiety, depression, and psychosis. She had stopped multiple medications in the recent weeks. She felt that she was having side effect of not being able to breath which also increased her anxiety symptoms. She was medically cleared and transferred to [facility]. She reported significantly poor sleep. Pt was attempted on trial of haldol which she reported was helpful for her in the past. Pt tolerated medication changes and reported improvement of her mood and anxiety symptoms. Today she denied SI/HI/AH/VH. Her sleep has improved significantly but she continues to complain of nightmares related to previous trauma. She felt that she would be safe to discharge and she was able to engage in safety planning including returning to [facility] or calling 911 if she felt unable to maintain her own safety. She had no further questions, comments, or concerns.   Plan Of Care/Follow-up recommendations:  - Schizoaffective disorder, bipolar type  - haldol 5mg  qAM + 10mg  qhs  - zoloft 100mg  qDay - Anxiety associated with PTSD  - atarax 50mg  po BID  - haldol 2.5mg  q6h prn severe anxiety - Extrapyramidal symptoms  - cogentin 1mg  BID - Nightmares associated with PTSD  - prazosin 2mg  qhs - Insomnia  - trazodone 300mg  qhs prn insomnia  Activity:  as tolerated Diet:  normal Tests:  N/A Other:  see above for [place], MD 10/14/2017, 10:26 AM
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
## 2017-11-03 ENCOUNTER — Encounter (INDEPENDENT_AMBULATORY_CARE_PROVIDER_SITE_OTHER): Payer: Self-pay

## 2017-11-03 ENCOUNTER — Ambulatory Visit (INDEPENDENT_AMBULATORY_CARE_PROVIDER_SITE_OTHER): Payer: 59 | Admitting: Psychiatry

## 2017-11-03 ENCOUNTER — Encounter (HOSPITAL_COMMUNITY): Payer: Self-pay | Admitting: Psychiatry

## 2017-11-03 VITALS — BP 112/74 | HR 88 | Ht 62.0 in | Wt 219.0 lb

## 2017-11-03 DIAGNOSIS — R450 Nervousness: Secondary | ICD-10-CM

## 2017-11-03 DIAGNOSIS — F250 Schizoaffective disorder, bipolar type: Secondary | ICD-10-CM

## 2017-11-03 DIAGNOSIS — Z818 Family history of other mental and behavioral disorders: Secondary | ICD-10-CM | POA: Diagnosis not present

## 2017-11-03 DIAGNOSIS — R4582 Worries: Secondary | ICD-10-CM | POA: Diagnosis not present

## 2017-11-03 DIAGNOSIS — R5383 Other fatigue: Secondary | ICD-10-CM

## 2017-11-03 DIAGNOSIS — K219 Gastro-esophageal reflux disease without esophagitis: Secondary | ICD-10-CM | POA: Diagnosis not present

## 2017-11-03 DIAGNOSIS — G43909 Migraine, unspecified, not intractable, without status migrainosus: Secondary | ICD-10-CM | POA: Diagnosis not present

## 2017-11-03 DIAGNOSIS — F419 Anxiety disorder, unspecified: Secondary | ICD-10-CM

## 2017-11-03 MED ORDER — PRAZOSIN HCL 5 MG PO CAPS: 5.0000 mg | ORAL_CAPSULE | Freq: Every day | ORAL | 0 refills | Status: DC

## 2017-11-03 MED ORDER — CARIPRAZINE HCL 3 MG PO CAPS: 3.0000 mg | ORAL_CAPSULE | Freq: Every day | ORAL | 0 refills | Status: DC

## 2017-11-03 MED ORDER — GABAPENTIN 100 MG PO CAPS: 100.0000 mg | ORAL_CAPSULE | Freq: Three times a day (TID) | ORAL | 0 refills | Status: DC

## 2017-11-03 NOTE — Patient Instructions (Addendum)
1. Discontinue Haldol 2. Start Vraylar 3 mg at night 3. Continue cogentin 1 mg twice a day  4. Continue gabapentin 100 mg three times a day  5. Increase prazosin 5 mg at night  6. Continue sertraline 100 mg daily  7. Continue Trazodone 300 mg at night  8. Return to clinic in two weeks for 30 mins

## 2017-11-04 ENCOUNTER — Encounter (HOSPITAL_COMMUNITY): Payer: Self-pay | Admitting: Psychiatry

## 2017-11-04 LAB — PROLACTIN: PROLACTIN: 124.8 ng/mL — AB

## 2017-11-07 ENCOUNTER — Ambulatory Visit (HOSPITAL_COMMUNITY): Payer: Self-pay | Admitting: Licensed Clinical Social Worker
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
## 2017-11-17 ENCOUNTER — Ambulatory Visit (HOSPITAL_COMMUNITY): Payer: 59 | Admitting: Psychiatry

## 2017-11-17 VITALS — BP 126/74 | HR 99 | Ht 62.0 in | Wt 218.0 lb

## 2017-11-17 DIAGNOSIS — R450 Nervousness: Secondary | ICD-10-CM | POA: Diagnosis not present

## 2017-11-17 DIAGNOSIS — Z87891 Personal history of nicotine dependence: Secondary | ICD-10-CM

## 2017-11-17 DIAGNOSIS — F250 Schizoaffective disorder, bipolar type: Secondary | ICD-10-CM | POA: Diagnosis not present

## 2017-11-17 DIAGNOSIS — F4310 Post-traumatic stress disorder, unspecified: Secondary | ICD-10-CM | POA: Diagnosis not present

## 2017-11-17 DIAGNOSIS — Z818 Family history of other mental and behavioral disorders: Secondary | ICD-10-CM

## 2017-11-17 DIAGNOSIS — F419 Anxiety disorder, unspecified: Secondary | ICD-10-CM | POA: Diagnosis not present

## 2017-11-17 MED ORDER — GABAPENTIN 100 MG PO CAPS: 100.0000 mg | ORAL_CAPSULE | Freq: Two times a day (BID) | ORAL | 0 refills | Status: DC

## 2017-11-17 MED ORDER — SERTRALINE HCL 100 MG PO TABS: 150.0000 mg | ORAL_TABLET | Freq: Every day | ORAL | 0 refills | Status: DC

## 2017-11-17 MED ORDER — PRAZOSIN HCL 5 MG PO CAPS: 5.0000 mg | ORAL_CAPSULE | Freq: Every day | ORAL | 0 refills | Status: DC

## 2017-11-17 MED ORDER — GABAPENTIN 300 MG PO CAPS: 300.0000 mg | ORAL_CAPSULE | Freq: Every day | ORAL | 0 refills | Status: DC

## 2017-11-17 MED ORDER — LORAZEPAM 1 MG PO TABS: 1.0000 mg | ORAL_TABLET | Freq: Every day | ORAL | 0 refills | Status: DC | PRN

## 2017-11-17 MED ORDER — TRAZODONE HCL 300 MG PO TABS: 300.0000 mg | ORAL_TABLET | Freq: Every evening | ORAL | 0 refills | Status: DC | PRN

## 2017-11-17 MED ORDER — CARIPRAZINE HCL 3 MG PO CAPS: 3.0000 mg | ORAL_CAPSULE | Freq: Every day | ORAL | 0 refills | Status: DC

## 2017-11-17 NOTE — Patient Instructions (Signed)
1. Continue Vraylar 3 mg at night 2. Increase gabapentin 100 mg twice a day and 300 mg at night 3. Increase sertraline 150 mg daily 4. Start ativan 0.5- 1 mg daily as needed for anxeity  5. Continue prazosin 5 mg at night 6. Continue Trazodone 300 mg at night  7. Decrease cogentin 0.5 mg twice a day for one week, then discontinue 8. Return to clinic in two weeks for 30 mins 9. Contact with your primary care dosctor; MRI in Feb 2018 showed microadenoma, and prolactin 124.8 on 10/2017.

## 2017-12-06 ENCOUNTER — Other Ambulatory Visit: Payer: Self-pay | Admitting: Internal Medicine

## 2017-12-06 DIAGNOSIS — D352 Benign neoplasm of pituitary gland: Secondary | ICD-10-CM

## 2017-12-08 ENCOUNTER — Other Ambulatory Visit (HOSPITAL_COMMUNITY): Payer: Self-pay | Admitting: Psychiatry

## 2017-12-08 MED ORDER — SERTRALINE HCL 100 MG PO TABS: 150.0000 mg | ORAL_TABLET | Freq: Every day | ORAL | 0 refills | Status: DC

## 2017-12-08 MED ORDER — PRAZOSIN HCL 5 MG PO CAPS: 5.0000 mg | ORAL_CAPSULE | Freq: Every day | ORAL | 0 refills | Status: DC

## 2017-12-08 MED ORDER — GABAPENTIN 300 MG PO CAPS: 300.0000 mg | ORAL_CAPSULE | Freq: Every day | ORAL | 0 refills | Status: DC

## 2017-12-08 MED ORDER — CARIPRAZINE HCL 3 MG PO CAPS: 3.0000 mg | ORAL_CAPSULE | Freq: Every day | ORAL | 0 refills | Status: DC

## 2017-12-08 MED ORDER — GABAPENTIN 100 MG PO CAPS: 100.0000 mg | ORAL_CAPSULE | Freq: Two times a day (BID) | ORAL | 0 refills | Status: DC

## 2017-12-08 MED ORDER — TRAZODONE HCL 300 MG PO TABS: 300.0000 mg | ORAL_TABLET | Freq: Every evening | ORAL | 0 refills | Status: DC | PRN

## 2017-12-22 ENCOUNTER — Ambulatory Visit (HOSPITAL_COMMUNITY): Payer: 59 | Admitting: Psychiatry
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
## 2018-01-05 ENCOUNTER — Encounter (HOSPITAL_COMMUNITY): Payer: Self-pay | Admitting: Psychiatry

## 2018-01-05 ENCOUNTER — Ambulatory Visit (INDEPENDENT_AMBULATORY_CARE_PROVIDER_SITE_OTHER): Payer: 59 | Admitting: Psychiatry

## 2018-01-05 VITALS — BP 138/87 | HR 97 | Ht 62.0 in | Wt 226.0 lb

## 2018-01-05 DIAGNOSIS — Z818 Family history of other mental and behavioral disorders: Secondary | ICD-10-CM

## 2018-01-05 DIAGNOSIS — F250 Schizoaffective disorder, bipolar type: Secondary | ICD-10-CM

## 2018-01-05 DIAGNOSIS — G40909 Epilepsy, unspecified, not intractable, without status epilepticus: Secondary | ICD-10-CM

## 2018-01-05 DIAGNOSIS — F410 Panic disorder [episodic paroxysmal anxiety] without agoraphobia: Secondary | ICD-10-CM

## 2018-01-05 DIAGNOSIS — Z79899 Other long term (current) drug therapy: Secondary | ICD-10-CM

## 2018-01-05 DIAGNOSIS — F4310 Post-traumatic stress disorder, unspecified: Secondary | ICD-10-CM

## 2018-01-05 DIAGNOSIS — Z87891 Personal history of nicotine dependence: Secondary | ICD-10-CM

## 2018-01-05 DIAGNOSIS — Z915 Personal history of self-harm: Secondary | ICD-10-CM

## 2018-01-05 MED ORDER — PRAZOSIN HCL 5 MG PO CAPS: 5.0000 mg | ORAL_CAPSULE | Freq: Every day | ORAL | 0 refills | Status: DC

## 2018-01-05 MED ORDER — CARIPRAZINE HCL 3 MG PO CAPS: 3.0000 mg | ORAL_CAPSULE | Freq: Every day | ORAL | 0 refills | Status: DC

## 2018-01-05 MED ORDER — GABAPENTIN 100 MG PO CAPS: 100.0000 mg | ORAL_CAPSULE | Freq: Two times a day (BID) | ORAL | 0 refills | Status: DC

## 2018-01-05 MED ORDER — GABAPENTIN 300 MG PO CAPS: 300.0000 mg | ORAL_CAPSULE | Freq: Every day | ORAL | 0 refills | Status: DC

## 2018-01-05 MED ORDER — SERTRALINE HCL 100 MG PO TABS: 200.0000 mg | ORAL_TABLET | Freq: Every day | ORAL | 0 refills | Status: DC

## 2018-01-05 NOTE — Patient Instructions (Addendum)
1. Continue Vraylar 3 mg at night 2. Continue gabapentin 100 mg twice a day and 300 mg at night 3. Increase sertraline 200 mg daily 4. Discontinue Ativan  5. Continue prazosin 5 mg at night  6. Continue Trazodone 300 mg at night as needed for sleep 7. Return to clinic in one month for 30 mins 8. 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

## 2018-01-23 ENCOUNTER — Telehealth (HOSPITAL_COMMUNITY): Payer: Self-pay | Admitting: *Deleted

## 2018-01-23 NOTE — Telephone Encounter (Signed)
Dr Modesta Messing Patient called in requesting to try the Xanax verses the other stated it's not working

## 2018-01-23 NOTE — Telephone Encounter (Signed)
Will not order at this time. Also reminds her that it takes a little time (at least a couple of weeks) for sertraline to get more effective. Make sure to have follow up appointment- will discuss more at the next visit.

## 2018-01-24 NOTE — Telephone Encounter (Signed)
Spoke with patient & informed per Dr Modesta Messing: Will not order at this time. Also reminds her that it takes a little time (at least a couple of weeks) for sertraline to get more effective. Make sure to have follow up appointment- will discuss more at the next visit.  Appointment f/u scheduled with Butch Penny

## 2018-01-31 ENCOUNTER — Other Ambulatory Visit (HOSPITAL_COMMUNITY): Payer: Self-pay | Admitting: Psychiatry

## 2018-01-31 MED ORDER — TRAZODONE HCL 300 MG PO TABS: 300.0000 mg | ORAL_TABLET | Freq: Every evening | ORAL | 0 refills | Status: DC | PRN

## 2018-01-31 MED ORDER — SERTRALINE HCL 100 MG PO TABS: 200.0000 mg | ORAL_TABLET | Freq: Every day | ORAL | 0 refills | Status: DC

## 2018-02-14 ENCOUNTER — Telehealth (HOSPITAL_COMMUNITY): Payer: Self-pay | Admitting: *Deleted

## 2018-02-14 NOTE — Telephone Encounter (Signed)
I do not think we have talked about starting Xanax. Advise her to stay on current medication, and call for earlier appointment if needed.

## 2018-02-14 NOTE — Telephone Encounter (Signed)
Dr Harrington Challenger Patient called stating that she is ready to try the Xanax 3 times daily as discussed

## 2018-02-14 NOTE — Telephone Encounter (Signed)
Dr. Hisada's pt 

## 2018-02-14 NOTE — Telephone Encounter (Signed)
Dr Modesta Messing Patient called stating that she is ready to try the Xanax 3 times daily as discussed
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
## 2018-02-28 ENCOUNTER — Ambulatory Visit (HOSPITAL_COMMUNITY): Payer: Self-pay | Admitting: Psychiatry

## 2018-03-02 ENCOUNTER — Other Ambulatory Visit (HOSPITAL_COMMUNITY): Payer: Self-pay | Admitting: Psychiatry

## 2018-03-02 MED ORDER — SERTRALINE HCL 100 MG PO TABS: 200.0000 mg | ORAL_TABLET | Freq: Every day | ORAL | 0 refills | Status: DC

## 2018-03-07 ENCOUNTER — Ambulatory Visit (HOSPITAL_COMMUNITY): Payer: Self-pay | Admitting: Psychiatry

## 2018-03-07 ENCOUNTER — Other Ambulatory Visit (HOSPITAL_COMMUNITY): Payer: Self-pay | Admitting: Psychiatry

## 2018-03-07 ENCOUNTER — Telehealth (HOSPITAL_COMMUNITY): Payer: Self-pay | Admitting: *Deleted

## 2018-03-07 MED ORDER — CARIPRAZINE HCL 3 MG PO CAPS: 3.0000 mg | ORAL_CAPSULE | Freq: Every day | ORAL | 0 refills | Status: DC

## 2018-03-07 NOTE — Telephone Encounter (Signed)
Ordered vraylar refill. I do not feel comfortable changing any medication without seeing her. Please remind her to keep her appointment; I will not be able to do any more refill without evaluation.

## 2018-03-07 NOTE — Telephone Encounter (Signed)
Dr Modesta Messing  [name] & to be stated on Xanax Next appt is on 03-20-18
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
## 2018-03-20 ENCOUNTER — Ambulatory Visit (HOSPITAL_COMMUNITY): Payer: Self-pay | Admitting: Psychiatry
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
## 2018-03-22 ENCOUNTER — Ambulatory Visit (HOSPITAL_COMMUNITY): Payer: No Typology Code available for payment source | Admitting: Psychiatry

## 2018-03-22 ENCOUNTER — Encounter (HOSPITAL_COMMUNITY): Payer: Self-pay | Admitting: Psychiatry

## 2018-03-22 VITALS — BP 138/86 | HR 106 | Ht 62.0 in | Wt 236.0 lb

## 2018-03-22 DIAGNOSIS — F4310 Post-traumatic stress disorder, unspecified: Secondary | ICD-10-CM

## 2018-03-22 DIAGNOSIS — Z818 Family history of other mental and behavioral disorders: Secondary | ICD-10-CM

## 2018-03-22 DIAGNOSIS — F250 Schizoaffective disorder, bipolar type: Secondary | ICD-10-CM | POA: Diagnosis not present

## 2018-03-22 DIAGNOSIS — Z87891 Personal history of nicotine dependence: Secondary | ICD-10-CM | POA: Diagnosis not present

## 2018-03-22 MED ORDER — SERTRALINE HCL 100 MG PO TABS: 200.0000 mg | ORAL_TABLET | Freq: Every day | ORAL | 0 refills | Status: DC

## 2018-03-22 MED ORDER — PRAZOSIN HCL 2 MG PO CAPS: 6.0000 mg | ORAL_CAPSULE | Freq: Every day | ORAL | 0 refills | Status: DC

## 2018-03-22 MED ORDER — ESZOPICLONE 1 MG PO TABS: 1.0000 mg | ORAL_TABLET | Freq: Every evening | ORAL | 0 refills | Status: DC | PRN

## 2018-03-22 MED ORDER — GABAPENTIN 300 MG PO CAPS: 300.0000 mg | ORAL_CAPSULE | Freq: Every day | ORAL | 0 refills | Status: DC

## 2018-03-22 MED ORDER — CARIPRAZINE HCL 3 MG PO CAPS: 3.0000 mg | ORAL_CAPSULE | Freq: Every day | ORAL | 0 refills | Status: DC

## 2018-03-22 MED ORDER — ALPRAZOLAM 0.5 MG PO TABS: 0.5000 mg | ORAL_TABLET | Freq: Two times a day (BID) | ORAL | 0 refills | Status: DC | PRN

## 2018-03-22 MED ORDER — GABAPENTIN 100 MG PO CAPS: 100.0000 mg | ORAL_CAPSULE | Freq: Two times a day (BID) | ORAL | 0 refills | Status: DC

## 2018-03-22 NOTE — Patient Instructions (Signed)
1. Continue Vraylar 3 mg at night (provided a sample for a month) 2. Continue gabapentin 100 mg twice a day and 300 mg at night 3. Continue sertraline 200 mg daily 4. Increase prazosin 6 mg at night  5. Start Xanax 0.5 m mg twice a day as needed for anxiety  6. Start lunesta 1 mg at night as needed for insomnia 7. Continue Trazodone 300 mg at nightas needed for sleep 8. Return to clinic in one month for 30 mins

## 2018-04-17 ENCOUNTER — Other Ambulatory Visit (HOSPITAL_COMMUNITY): Payer: Self-pay | Admitting: Psychiatry

## 2018-04-17 MED ORDER — ESZOPICLONE 1 MG PO TABS: 1.0000 mg | ORAL_TABLET | Freq: Every evening | ORAL | 0 refills | Status: DC | PRN

## 2018-04-17 MED ORDER — ALPRAZOLAM 0.5 MG PO TABS: 0.5000 mg | ORAL_TABLET | Freq: Two times a day (BID) | ORAL | 0 refills | Status: DC | PRN

## 2018-04-17 NOTE — Telephone Encounter (Signed)
Filled xanax, eszopiclone refill per request. (to be started 5/3)  I have utilized the [place] Controlled Substances Reporting System (PMP [id number]) to confirm adherence regarding the patient's medication. My review reveals appropriate prescription fills.

## 2018-04-19 ENCOUNTER — Other Ambulatory Visit (HOSPITAL_COMMUNITY): Payer: Self-pay | Admitting: Psychiatry

## 2018-04-19 MED ORDER — CARIPRAZINE HCL 3 MG PO CAPS: 3.0000 mg | ORAL_CAPSULE | Freq: Every day | ORAL | 0 refills | Status: DC

## 2018-04-19 MED ORDER — SERTRALINE HCL 100 MG PO TABS: 200.0000 mg | ORAL_TABLET | Freq: Every day | ORAL | 0 refills | Status: DC
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
## 2018-05-08 ENCOUNTER — Ambulatory Visit (INDEPENDENT_AMBULATORY_CARE_PROVIDER_SITE_OTHER): Payer: No Typology Code available for payment source | Admitting: Psychiatry

## 2018-05-08 ENCOUNTER — Encounter (HOSPITAL_COMMUNITY): Payer: Self-pay | Admitting: Psychiatry

## 2018-05-08 VITALS — BP 156/89 | HR 85 | Ht 62.0 in | Wt 242.0 lb

## 2018-05-08 DIAGNOSIS — F4310 Post-traumatic stress disorder, unspecified: Secondary | ICD-10-CM | POA: Diagnosis not present

## 2018-05-08 DIAGNOSIS — F419 Anxiety disorder, unspecified: Secondary | ICD-10-CM

## 2018-05-08 DIAGNOSIS — E221 Hyperprolactinemia: Secondary | ICD-10-CM

## 2018-05-08 DIAGNOSIS — Z915 Personal history of self-harm: Secondary | ICD-10-CM | POA: Diagnosis not present

## 2018-05-08 DIAGNOSIS — Z818 Family history of other mental and behavioral disorders: Secondary | ICD-10-CM

## 2018-05-08 DIAGNOSIS — G4700 Insomnia, unspecified: Secondary | ICD-10-CM

## 2018-05-08 DIAGNOSIS — F410 Panic disorder [episodic paroxysmal anxiety] without agoraphobia: Secondary | ICD-10-CM

## 2018-05-08 DIAGNOSIS — Z598 Other problems related to housing and economic circumstances: Secondary | ICD-10-CM | POA: Diagnosis not present

## 2018-05-08 DIAGNOSIS — F250 Schizoaffective disorder, bipolar type: Secondary | ICD-10-CM

## 2018-05-08 DIAGNOSIS — Z87891 Personal history of nicotine dependence: Secondary | ICD-10-CM | POA: Diagnosis not present

## 2018-05-08 DIAGNOSIS — R450 Nervousness: Secondary | ICD-10-CM | POA: Diagnosis not present

## 2018-05-08 MED ORDER — PRAZOSIN HCL 2 MG PO CAPS: 6.0000 mg | ORAL_CAPSULE | Freq: Every day | ORAL | 0 refills | Status: DC

## 2018-05-08 MED ORDER — SERTRALINE HCL 100 MG PO TABS: 200.0000 mg | ORAL_TABLET | Freq: Every day | ORAL | 0 refills | Status: DC

## 2018-05-08 MED ORDER — ZOLPIDEM TARTRATE 5 MG PO TABS: 5.0000 mg | ORAL_TABLET | Freq: Every evening | ORAL | 0 refills | Status: DC | PRN

## 2018-05-08 MED ORDER — ALPRAZOLAM 0.5 MG PO TABS: 0.5000 mg | ORAL_TABLET | Freq: Two times a day (BID) | ORAL | 0 refills | Status: DC | PRN

## 2018-05-08 MED ORDER — LITHIUM CARBONATE 300 MG PO TABS: 300.0000 mg | ORAL_TABLET | Freq: Every day | ORAL | 0 refills | Status: DC

## 2018-05-08 NOTE — Patient Instructions (Addendum)
1. Continue Vraylar 3 mg at night 2  Discontinue gabapentin 3. Continue sertraline200mg  daily 4. Continue prazosin 6 mg at night  5. Continue Xanax 0.5 m mg twice a day as needed for anxiety  6. Discontinue Lunesta  7. Start Ambien 5 mg at night as needed for sleep 8. Start lithium 300 mg at night  9 Return to clinic in one month for 30 mins 10. Obtain blood test five days after starting lithium (BMP, lithium)

## 2018-05-09 ENCOUNTER — Telehealth (HOSPITAL_COMMUNITY): Payer: Self-pay | Admitting: Psychiatry

## 2018-05-09 ENCOUNTER — Other Ambulatory Visit: Payer: Self-pay

## 2018-05-09 ENCOUNTER — Encounter (HOSPITAL_COMMUNITY): Payer: Self-pay

## 2018-05-09 ENCOUNTER — Emergency Department (HOSPITAL_COMMUNITY)
Admission: EM | Admit: 2018-05-09 | Discharge: 2018-05-09 | Disposition: A | Discharge status: Home / Self Care | Payer: Self-pay | Attending: Emergency Medicine | Admitting: Emergency Medicine

## 2018-05-09 DIAGNOSIS — J45909 Unspecified asthma, uncomplicated: Secondary | ICD-10-CM | POA: Insufficient documentation

## 2018-05-09 DIAGNOSIS — Z79899 Other long term (current) drug therapy: Secondary | ICD-10-CM | POA: Insufficient documentation

## 2018-05-09 DIAGNOSIS — T50904A Poisoning by unspecified drugs, medicaments and biological substances, undetermined, initial encounter: Secondary | ICD-10-CM | POA: Insufficient documentation

## 2018-05-09 DIAGNOSIS — Z87891 Personal history of nicotine dependence: Secondary | ICD-10-CM | POA: Insufficient documentation

## 2018-05-09 DIAGNOSIS — F250 Schizoaffective disorder, bipolar type: Secondary | ICD-10-CM | POA: Insufficient documentation

## 2018-05-09 LAB — ACETAMINOPHEN LEVEL

## 2018-05-09 LAB — CBC WITH DIFFERENTIAL/PLATELET
Basophils Absolute: 0 10*3/uL (ref 0.0–0.1)
Basophils Relative: 0 %
EOS ABS: 0.2 10*3/uL (ref 0.0–0.7)
EOS PCT: 4 %
HCT: 38 % (ref 36.0–46.0)
HEMOGLOBIN: 12.7 g/dL (ref 12.0–15.0)
LYMPHS ABS: 1.2 10*3/uL (ref 0.7–4.0)
Lymphocytes Relative: 21 %
MCH: 31.4 pg (ref 26.0–34.0)
MCHC: 33.4 g/dL (ref 30.0–36.0)
MCV: 93.8 fL (ref 78.0–100.0)
MONOS PCT: 7 %
Monocytes Absolute: 0.4 10*3/uL (ref 0.1–1.0)
Neutro Abs: 4.1 10*3/uL (ref 1.7–7.7)
Neutrophils Relative %: 68 %
PLATELETS: 193 10*3/uL (ref 150–400)
RBC: 4.05 MIL/uL (ref 3.87–5.11)
RDW: 12.9 % (ref 11.5–15.5)
WBC: 5.9 10*3/uL (ref 4.0–10.5)

## 2018-05-09 LAB — COMPREHENSIVE METABOLIC PANEL
ALK PHOS: 60 U/L (ref 38–126)
ALT: 33 U/L (ref 14–54)
ANION GAP: 10 (ref 5–15)
AST: 27 U/L (ref 15–41)
Albumin: 4.4 g/dL (ref 3.5–5.0)
BUN: 18 mg/dL (ref 6–20)
CALCIUM: 9.4 mg/dL (ref 8.9–10.3)
CO2: 22 mmol/L (ref 22–32)
Chloride: 104 mmol/L (ref 101–111)
Creatinine, Ser: 0.85 mg/dL (ref 0.44–1.00)
GFR calc non Af Amer: >60 mL/min (ref >60)
Glucose, Bld: 120 mg/dL — ABNORMAL HIGH (ref 65–99)
Potassium: 3.8 mmol/L (ref 3.5–5.1)
SODIUM: 136 mmol/L (ref 135–145)
Total Bilirubin: 0.5 mg/dL (ref 0.3–1.2)
Total Protein: 7.5 g/dL (ref 6.5–8.1)

## 2018-05-09 LAB — URINALYSIS, ROUTINE W REFLEX MICROSCOPIC
Bilirubin Urine: NEGATIVE
Glucose, UA: NEGATIVE mg/dL
Hgb urine dipstick: NEGATIVE
Ketones, ur: NEGATIVE mg/dL
LEUKOCYTES UA: NEGATIVE
NITRITE: NEGATIVE
Protein, ur: NEGATIVE mg/dL
SPECIFIC GRAVITY, URINE: 1.018 (ref 1.005–1.030)
pH: 6 (ref 5.0–8.0)

## 2018-05-09 LAB — ETHANOL: Alcohol, Ethyl (B): <10 mg/dL (ref <10)

## 2018-05-09 LAB — RAPID URINE DRUG SCREEN, HOSP PERFORMED
Amphetamines: NOT DETECTED
BENZODIAZEPINES: NOT DETECTED
Barbiturates: NOT DETECTED
Cocaine: NOT DETECTED
Opiates: NOT DETECTED
Tetrahydrocannabinol: NOT DETECTED

## 2018-05-09 LAB — LITHIUM LEVEL: LITHIUM LVL: 0.06 mmol/L — AB (ref 0.60–1.20)

## 2018-05-09 LAB — SALICYLATE LEVEL

## 2018-05-09 MED ORDER — ALPRAZOLAM 0.5 MG PO TABS: 0.5000 mg | ORAL_TABLET | Freq: Two times a day (BID) | ORAL | 0 refills | Status: DC | PRN

## 2018-05-09 MED ORDER — SODIUM CHLORIDE 0.9 % IV BOLUS
1000.0000 mL | Freq: Once | INTRAVENOUS | Status: AC
  Administered 2018-05-09: 1000 mL via INTRAVENOUS

## 2018-05-09 MED ORDER — OXYMETAZOLINE HCL 0.05 % NA SOLN
1.0000 | Freq: Once | NASAL | Status: AC
  Administered 2018-05-09: 1 via NASAL
  Filled 2018-05-09: qty 15

## 2018-05-09 NOTE — BH Assessment (Signed)
[facility] Assessment Progress Note  Per Ethelene Hal, FNP, this pt does not require psychiatric hospitalization at this time.  Pt is to be discharged from [facility] with recommendation to continue treatment with Norman Clay, MD at the [facility].  This has been included in pt's discharge instructions.  Pt's nurse has been notified.  Jalene Mullet, [place] Triage Specialist [phone]

## 2018-05-09 NOTE — ED Notes (Signed)
Bed: WA21 Expected date:  Expected time:  Means of arrival:  Comments: EMS-OD

## 2018-05-09 NOTE — Discharge Instructions (Signed)
For your behavioral health needs, you are advised to continue treatment with Norman Clay, MD:       Norman Clay, MD      [facility]. [address]      [place], [place] [address]      [phone]

## 2018-05-09 NOTE — ED Triage Notes (Signed)
EMS reports from home daughter called found Pt lethargic and slow responding this morning, possible overdose, found Ambien bottle refilled yesterday with 30 EMS counted 7 in bottle on scene. Pt able to respond and communicate at arrival, slurred speech, severe nasal congestion.  BP 150/88 HR 100 Resp 24 Sp02 97 RA CBG 134  18ga L hand

## 2018-05-09 NOTE — Telephone Encounter (Signed)
Discussed with ED provider.   This note writer has seen this patient since 10/2017 after she was discharged from [facility]. Although she has been complaining of anxiety, she denies any SI since the initial visit. She demonstrates calm affect and has not had significant psychotic/manic/neurovegetative symptoms. She has not shown any concerning signs of SI (and denies SI) at yesterday's visit. This note Probation officer asked ED provider to place psychiatry consult given the patient overdose is very unexpected based on the evaluation yesterday. She is usually also very aware of the medication she takes ; I believe it is very important to explore the reason if she took medication more than prescribed especially given she does have a history of overdose.

## 2018-05-09 NOTE — Telephone Encounter (Signed)
Noted. Done.

## 2018-05-09 NOTE — ED Provider Notes (Addendum)
[place] DEPT Provider Note   CSN: [id number] Arrival date & time: 05/09/18  0907     History   Chief Complaint Chief Complaint  Patient presents with  . Drug Overdose    HPI Deborah Pena is a 52 y.o. female with history of schizoaffective disorder, PTSD, panic disorder brought to the ER by EMS after being found unresponsive.  History is obtained from triage note, patient and phone conversation with patient's mother.  Patient states last thing she remembers is taking her medications before bed last night, thinks she took 4 Ambien.  She took all her other medications.  States that she had a psychiatry appointment yesterday and psychiatrist started her on Lamictal.  Currently she reports nasal congestion that she attributes to allergies but offers no other complaints.  She denies suicidal attempt last night, suicidal thoughts, homicidal thoughts, AVH.  Reports remote history of suicidal attempt at age 70.  She denies recent fevers, cough, nausea, vomiting, chest pain, abdominal pain, changes in her bowel movements, urinary symptoms.  She lives with her daughter.  Had 2 beers at lunchtime yesterday.   HPI  Past Medical History:  Diagnosis Date  . Anemia   . Anginal pain ([place])   . Anxiety   . Arthritis    "left hand" (04/26/2016)  . Asthma   . Chronic lower back pain   . Diverticulitis of colon   . GERD (gastroesophageal reflux disease)   . Headache    'once or twice/week" (04/26/2016)  . Migraine    "twice/month" (04/26/2016)  . Stomach ulcer     Patient Active Problem List   Diagnosis Date Noted  . Schizoaffective disorder, bipolar type ([place]) 10/10/2017  . Hyperprolactinemia ([place]) 08/16/2017  . ADD (attention deficit disorder) 08/14/2017  . Alcohol abuse 03/13/2017  . PTSD (post-traumatic stress disorder) 01/16/2017  . Panic disorder 01/16/2017  . Acute diverticulitis 04/26/2016  . Diverticulitis of large intestine with abscess without  bleeding 04/26/2016  . Intractable migraine 07/27/2014  . Insomnia 05/26/2013    Past Surgical History:  Procedure Laterality Date  . ABDOMINAL HYSTERECTOMY  2013   preformed at [facility] by Dr Jacqualyn Posey in April  . BREAST SURGERY Bilateral ~ 2000   "dual lateral duct removal"   . BUNIONECTOMY Right 2013  . CESAREAN SECTION  1999  . OOPHORECTOMY Right 2013  . TONSILLECTOMY       OB History   None      Home Medications    Prior to Admission medications   Medication Sig Start Date End Date Taking? Authorizing Provider  acetaminophen (TYLENOL) 325 MG tablet Take 2 tablets (650 mg total) by mouth every 6 (six) hours as needed for mild pain. 10/14/17  Yes Lindell Spar I, NP  albuterol (PROVENTIL HFA;VENTOLIN HFA) 108 (90 Base) MCG/ACT inhaler Inhale 2 puffs into the lungs every 6 (six) hours as needed for wheezing or shortness of breath. 10/14/17  Yes Lindell Spar I, NP  ALPRAZolam Duanne Moron) 0.5 MG tablet Take 1 tablet (0.5 mg total) by mouth 2 (two) times daily as needed for anxiety. 05/20/18  Yes Hisada, Elie Goody, MD  ALPRAZolam Duanne Moron) 1 MG tablet Take by mouth. 02/23/16  Yes [provider]  cariprazine (VRAYLAR) capsule Take 1 capsule (3 mg total) by mouth daily. 04/19/18  Yes Norman Clay, MD  lithium 300 MG tablet Take 1 tablet (300 mg total) by mouth at bedtime. 05/08/18  Yes Norman Clay, MD  Multiple Vitamin (MULTIVITAMIN WITH MINERALS) TABS tablet Take 1  tablet by mouth daily. Vitamin supplement 10/14/17  Yes Nwoko, Herbert Pun I, NP  naphazoline-glycerin (CLEAR EYES) 0.012-0.2 % SOLN Place 1-2 drops into both eyes 4 (four) times daily as needed for eye irritation. 10/14/17  Yes Lindell Spar I, NP  prazosin (MINIPRESS) 2 MG capsule Take 3 capsules (6 mg total) by mouth at bedtime. 05/08/18  Yes Norman Clay, MD  sertraline (ZOLOFT) 100 MG tablet Take 2 tablets (200 mg total) by mouth daily. For depression 05/08/18  Yes Hisada, Elie Goody, MD  zolpidem (AMBIEN) 5 MG tablet Take 1 tablet  (5 mg total) by mouth at bedtime as needed for sleep. 05/08/18  Yes Hisada, Elie Goody, MD  nicotine (NICODERM CQ - DOSED IN MG/24 HOURS) 21 mg/24hr patch Place 1 patch (21 mg total) onto the skin daily at 6 (six) AM. (May purchase from over the counter at the pharmacy): For smoking cessation Patient not taking: Reported on 05/09/2018 10/14/17   Encarnacion Slates, NP    Family History Family History  Problem Relation Age of Onset  . Heart failure Father   . Bipolar disorder Father   . Schizophrenia Father   . Diabetes Other   . Breast cancer Other   . Diverticulitis Other   . Mental illness Paternal Grandmother   . Mental illness Paternal Grandfather   . Schizophrenia Paternal Aunt   . Suicidality Maternal Aunt     Social History Social History   Tobacco Use  . Smoking status: Former Smoker    Packs/day: 2.00    Years: 30.00    Pack years: 60.00    Types: Cigarettes  . Smokeless tobacco: Former Systems developer    Types: Chew  . Tobacco comment: "quit smoking cigarettes in ~ 2011; chewed when I was little"  Substance Use Topics  . Alcohol use: No  . Drug use: No     Allergies   Lamictal [lamotrigine]; Buprenorphine hcl; Ciprofloxacin; and Propranolol   Review of Systems Review of Systems  HENT: Positive for congestion.   Neurological:       Memory deficit  Psychiatric/Behavioral: Positive for sleep disturbance (chronic).  All other systems reviewed and are negative.    Physical Exam Updated Vital Signs BP (!) 148/77 (BP Location: Left Arm)   Pulse 93   Temp 98.1 F (36.7 C) (Oral)   Resp 18   Ht 5\' 3"  (1.6 m)   SpO2 94%   BMI 42.87 kg/m   Physical Exam  Constitutional: She is oriented to person, place, and time. She appears well-developed and well-nourished. No distress.  Non toxic  HENT:  Head: Normocephalic and atraumatic.  Nose: Nose normal.  Sounds very congestion. Moderate mucosal edema bilaterally. Septum midline without perforation or hematoma. Dry mucous  membranes   Eyes: Pupils are equal, round, and reactive to light. Conjunctivae and EOM are normal.  Neck: Normal range of motion.  Cardiovascular: Normal rate, regular rhythm and intact distal pulses.  2+ DP and radial pulses bilaterally. No LE edema.   Pulmonary/Chest: Effort normal and breath sounds normal.  Abdominal: Soft. Bowel sounds are normal. There is no tenderness.  No G/R/R. No suprapubic or CVA tenderness. Negative Murphy's and McBurney's.   Musculoskeletal: Normal range of motion.  Neurological: She is alert and oriented to person, place, and time.  Alert and oriented to self, place, time and event.  Speech is fluent without obvious dysarthria or dysphasia. Strength 5/5 with hand grip and ankle F/E.   Sensation to light touch intact in hands and feet. Normal  gait/No truncal sway. No pronator drift. No leg drop.  Normal finger-to-nose and finger tapping.  CN I and VIII not tested. CN II-XII grossly intact bilaterally.   Skin: Skin is warm and dry. Capillary refill takes less than 2 seconds.  Psychiatric: She has a normal mood and affect. Her behavior is normal.  Mood and behavior appear normal. Denies SI, HI, AVH.   Nursing note and vitals reviewed.    ED Treatments / Results  Labs (all labs ordered are listed, but only abnormal results are displayed) Labs Reviewed  COMPREHENSIVE METABOLIC PANEL - Abnormal; Notable for the following components:      Result Value   Glucose, Bld 120 (*)    All other components within normal limits  ACETAMINOPHEN LEVEL - Abnormal; Notable for the following components:   Acetaminophen (Tylenol), Serum <10 (*)    All other components within normal limits  LITHIUM LEVEL - Abnormal; Notable for the following components:   Lithium Lvl 0.06 (*)    All other components within normal limits  CBC WITH DIFFERENTIAL/PLATELET  ETHANOL  SALICYLATE LEVEL  RAPID URINE DRUG SCREEN, HOSP PERFORMED  URINALYSIS, ROUTINE W REFLEX MICROSCOPIC    LAMOTRIGINE LEVEL    EKG None  Radiology No results found.  Procedures Procedures (including critical care time)  Medications Ordered in ED Medications  oxymetazoline (AFRIN) 0.05 % nasal spray 1 spray (1 spray Each Nare Given 05/09/18 1057)  sodium chloride 0.9 % bolus 1,000 mL (0 mLs Intravenous Stopped 05/09/18 1221)     Initial Impression / Assessment and Plan / ED Course  I have reviewed the triage vital signs and the nursing notes.  Pertinent labs & imaging results that were available during my care of the patient were reviewed by me and considered in my medical decision making (see chart for details).  Clinical Course as of May 09 2009  Tue May 09, 2018  1045 Attempted to contact daughter unsuccessful, Mallory at phone # [phone] that patient provided, this number does not match phone number noted on paperwork at bedside. I called Moshe Salisbury (mother) [phone] who is unaware of what happened   [CG]  1113 Unable to contact daughter [name] again [phone]   [CG]  1136 Spoke to pt's psychiatrist Dr. Modesta Messing who reviewed patient's chart. Dr Modesta Messing just saw patient yesterday and pt at baseline. She confirmed she was prescribed 60 tab of Ambien yesterday. Dr Modesta Messing recommends formal psych evaluation in ER before discharge.    [CG]    Clinical Course User Index [CG] Kinnie Feil, PA-C   52 year old here after being found unresponsive by daughter.  Concerned about overdose / polypharmacy with undetermined intention.  She was seen by psychiatrist yesterday and started on Ambien and Lamictal.  She is also taking Xanax.  Per EMS, patient there was a bottle of Ambien with 7 tablets left in medicine cabinet.  Reviewed narcotic database and patient last filled alprazolam 60 tablets on 5/1, however she was given a new prescription for 60 alprazolam yesterday by psychiatrist.   Exam as above is unremarkable.  She denies active SI, HI, AVH.  She is cooperative.  I attempted to  contact her daughter who reportedly found her this morning but was unable to.  I spoke to patient's mother who is in [place] and is unable to provide further information.  I contacted patient's psychiatrist who saw her yesterday and gave her an update, she is requesting formal psych evaluation before discharge.  Lab work reviewed and WNL.  UDS completely negative including BZD. Vital signs initially with tachycardia and tachypnea, this has resolved. Unclear cause of transient unresponsiveness. No h/o seizures. No cardiac symptoms.  Highest suspicion for polypharmacy.   Pending TTS recommendation for disposition.  One previous ER visit for drug overdose one year ago. She is medically cleared.  Final Clinical Impressions(s) / ED Diagnoses   TTS evaluated pt and recommends discharged. TTS reportedly clarified information after speaking to daughter. Re-evaluated pt who is ambulatory in ER, tolearting PO.  Final diagnoses:  Overdose, undetermined intent, initial encounter    ED Discharge Orders    None        Kinnie Feil, PA-C 05/09/18 2010    Arlean Hopping 05/09/18 2014    Dorie Rank, MD 05/10/18 [phone]

## 2018-05-09 NOTE — BH Assessment (Addendum)
Assessment Note  Deborah Pena is a 52 y.o. female who presents voluntarily to [facility] due to an OD on medication. TTS consult requested to determine if OD was intentional. Pt was seen by her psychiatrist yesterday and determined to be at low risk for suicide, given her multiple protective factors. Pt was given new prescriptions for lithium and ambien. Pt reports that she took both yesterday, as well as some Nyquil, due to her nasal congestion. Pt denies that she had any intention or plan to kill herself. Pt believes that the lithium caused her altered state, as she has never taken it before. Pt denies SI, HI, AVH. Pt says that she likes herself and her life right now and has a job that she doesn't want to miss going to tomorrow.   Clinician called pt's daughter, [name] [phone]), who reports that pt started acting very strangely last night, talking nonsense and being very lethargic. In the morning, pt appeared to be having trouble breathing and that's when she felt that pt needed to come to the hospital. Mallory denies that, in her altered state, pt said anything about trying to kill herself. Mallory believes that pt's OD was accidental, but wasn't sure.   Case staffed with Jinny Blossom, NP, and it was determined that pt could be d/c and continue f/u with her current provider. Carmon Sails, PA, notified of disposition.    Diagnosis: Schizoaffective d/o, bipolar type  Past Medical History:  Past Medical History:  Diagnosis Date  . Anemia   . Anginal pain ([place])   . Anxiety   . Arthritis    "left hand" (04/26/2016)  . Asthma   . Chronic lower back pain   . Diverticulitis of colon   . GERD (gastroesophageal reflux disease)   . Headache    'once or twice/week" (04/26/2016)  . Migraine    "twice/month" (04/26/2016)  . Stomach ulcer     Past Surgical History:  Procedure Laterality Date  . ABDOMINAL HYSTERECTOMY  2013   preformed at [facility] by Dr Jacqualyn Posey in April  . BREAST SURGERY  Bilateral ~ 2000   "dual lateral duct removal"   . BUNIONECTOMY Right 2013  . CESAREAN SECTION  1999  . OOPHORECTOMY Right 2013  . TONSILLECTOMY      Family History:  Family History  Problem Relation Age of Onset  . Heart failure Father   . Bipolar disorder Father   . Schizophrenia Father   . Diabetes Other   . Breast cancer Other   . Diverticulitis Other   . Mental illness Paternal Grandmother   . Mental illness Paternal Grandfather   . Schizophrenia Paternal Aunt   . Suicidality Maternal Aunt     Social History:  reports that she has quit smoking. Her smoking use included cigarettes. She has a 60.00 pack-year smoking history. She has quit using smokeless tobacco. Her smokeless tobacco use included chew. She reports that she does not drink alcohol or use drugs.  Additional Social History:  Alcohol / Drug Use Pain Medications: see PTA meds Prescriptions: see PTA meds Over the Counter: see PTA meds History of alcohol / drug use?: No history of alcohol / drug abuse  CIWA: CIWA-Ar BP: (!) 156/88 Pulse Rate: 96 COWS:    Allergies:  Allergies  Allergen Reactions  . Lamictal [Lamotrigine] Shortness Of Breath, Rash and Other (See Comments)    Swollen lymph nodes  . Buprenorphine Hcl Other (See Comments)    "Becomes psychotic"--per notes from another healthcare network  .  Ciprofloxacin Itching and Other (See Comments)    Sore throat, hoarseness  . Propranolol Other (See Comments)    Syncope    Home Medications:  (Not in a hospital admission)  OB/GYN Status:  No LMP recorded. Patient has had a hysterectomy.  General Assessment Data Location of Assessment: [facility] ED TTS Assessment: In system Is this a Tele or Face-to-Face Assessment?: Face-to-Face Is this an Initial Assessment or a Re-assessment for this encounter?: Initial Assessment Marital status: Divorced [place] name: [place] Is patient pregnant?: No Pregnancy Status: No Living Arrangements: Children Can pt  return to current living arrangement?: Yes Admission Status: Voluntary Is patient capable of signing voluntary admission?: Yes Referral Source: Self/Family/Friend     Crisis Care Plan Living Arrangements: Children Name of Psychiatrist: Norman Clay Name of Therapist: none  Education Status Is patient currently in school?: No Is the patient employed, unemployed or receiving disability?: Employed  Risk to self with the past 6 months Suicidal Ideation: No Has patient been a risk to self within the past 6 months prior to admission? : No Suicidal Intent: No Has patient had any suicidal intent within the past 6 months prior to admission? : No Is patient at risk for suicide?: No Suicidal Plan?: No Has patient had any suicidal plan within the past 6 months prior to admission? : No Access to Means: No Previous Attempts/Gestures: Yes How many times?: 5 Triggers for Past Attempts: Unknown Intentional Self Injurious Behavior: None Family Suicide History: Unknown Persecutory voices/beliefs?: No Depression: No Substance abuse history and/or treatment for substance abuse?: No Suicide prevention information given to non-admitted patients: Not applicable  Risk to Others within the past 6 months Homicidal Ideation: No Does patient have any lifetime risk of violence toward others beyond the six months prior to admission? : No Thoughts of Harm to Others: No Current Homicidal Intent: No Current Homicidal Plan: No Access to Homicidal Means: No History of harm to others?: No Assessment of Violence: None Noted Does patient have access to weapons?: No Criminal Charges Pending?: No Does patient have a court date: No Is patient on probation?: No  Psychosis Hallucinations: None noted Delusions: None noted  Mental Status Report Appearance/Hygiene: Unremarkable Eye Contact: Fair Motor Activity: Unremarkable Speech: Unremarkable Level of Consciousness: Alert Mood: Pleasant,  Euthymic Affect: Appropriate to circumstance Anxiety Level: Moderate Thought Processes: Coherent, Relevant Judgement: Partial Orientation: Person, Place, Time, Situation Obsessive Compulsive Thoughts/Behaviors: None  Cognitive Functioning Concentration: Normal Memory: Recent Impaired Is patient IDD: No Is patient DD?: No Insight: Fair Impulse Control: Fair Appetite: Fair Have you had any weight changes? : No Change Sleep: No Change Vegetative Symptoms: None  ADLScreening [facility] Services) Patient's cognitive ability adequate to safely complete daily activities?: Yes Patient able to express need for assistance with ADLs?: Yes Independently performs ADLs?: Yes (appropriate for developmental age)  Prior Inpatient Therapy Prior Inpatient Therapy: Yes Prior Therapy Dates: 2010; 3 xs in 2018 Prior Therapy Facilty/Provider(s): [facility] Reason for Treatment: schizoaffective  Prior Outpatient Therapy Prior Outpatient Therapy: No Does patient have an ACCT team?: No Does patient have Intensive In-House Services?  : No Does patient have [facility] services? : No Does patient have P4CC services?: No  ADL Screening (condition at time of admission) Patient's cognitive ability adequate to safely complete daily activities?: Yes Is the patient deaf or have difficulty hearing?: No Does the patient have difficulty seeing, even when wearing glasses/contacts?: No Does the patient have difficulty concentrating, remembering, or making decisions?: Yes Patient able to express need for  assistance with ADLs?: Yes Does the patient have difficulty dressing or bathing?: No Independently performs ADLs?: Yes (appropriate for developmental age) Does the patient have difficulty walking or climbing stairs?: No Weakness of Legs: None Weakness of Arms/Hands: None  Home Assistive Devices/Equipment Home Assistive Devices/Equipment: None    Abuse/Neglect Assessment (Assessment to be complete while  patient is alone) Abuse/Neglect Assessment Can Be Completed: Yes Physical Abuse: Denies Verbal Abuse: Denies Sexual Abuse: Denies Exploitation of patient/patient's resources: Denies Self-Neglect: Denies     Regulatory affairs officer (For Healthcare) Does Patient Have a Medical Advance Directive?: No Would patient like information on creating a medical advance directive?: No - Patient declined Nutrition Screen- [place] [facility] Patient's home diet: Regular Has the patient recently lost weight without trying?: No Has the patient been eating poorly because of a decreased appetite?: No Malnutrition Screening Tool Score: 0        Disposition:  Disposition Initial Assessment Completed for this Encounter: Yes  On Site Evaluation by:   Reviewed with Physician:    Rexene Edison 05/09/2018 2:32 PM

## 2018-05-09 NOTE — ED Notes (Signed)
Pt continues to ask for belongings, states that she does not remember calling daughter earlier today.

## 2018-05-09 NOTE — Telephone Encounter (Signed)
Received request to re-send xanax order to [facility]. Ordered.  Please contact [facility] at [address] to cancel xanax order (placed 5/20). [phone]

## 2018-05-09 NOTE — ED Notes (Signed)
Pt changed into purple scrubs 

## 2018-05-09 NOTE — ED Notes (Signed)
PT HAS BELONGING BAG IN CABINET 16-18

## 2018-05-10 LAB — LAMOTRIGINE LEVEL: Lamotrigine Lvl: NOT DETECTED ug/mL (ref 2.0–20.0)

## 2018-06-07 ENCOUNTER — Other Ambulatory Visit (HOSPITAL_COMMUNITY): Payer: Self-pay | Admitting: Psychiatry

## 2018-06-07 MED ORDER — ZOLPIDEM TARTRATE 5 MG PO TABS: 5.0000 mg | ORAL_TABLET | Freq: Every evening | ORAL | 0 refills | Status: DC | PRN

## 2018-06-08 ENCOUNTER — Other Ambulatory Visit (HOSPITAL_COMMUNITY): Payer: Self-pay | Admitting: Psychiatry

## 2018-06-08 ENCOUNTER — Ambulatory Visit (HOSPITAL_COMMUNITY): Payer: Self-pay | Admitting: Psychiatry

## 2018-06-08 MED ORDER — ALPRAZOLAM 0.5 MG PO TABS: 0.5000 mg | ORAL_TABLET | Freq: Two times a day (BID) | ORAL | 0 refills | Status: DC | PRN

## 2018-06-08 MED ORDER — PRAZOSIN HCL 2 MG PO CAPS: 6.0000 mg | ORAL_CAPSULE | Freq: Every day | ORAL | 0 refills | Status: DC
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
## 2018-06-27 ENCOUNTER — Encounter (HOSPITAL_COMMUNITY): Payer: Self-pay | Admitting: Psychiatry

## 2018-06-27 ENCOUNTER — Ambulatory Visit (INDEPENDENT_AMBULATORY_CARE_PROVIDER_SITE_OTHER): Payer: No Typology Code available for payment source | Admitting: Psychiatry

## 2018-06-27 VITALS — BP 142/95 | HR 88 | Ht 63.0 in | Wt 248.0 lb

## 2018-06-27 DIAGNOSIS — F4310 Post-traumatic stress disorder, unspecified: Secondary | ICD-10-CM | POA: Diagnosis not present

## 2018-06-27 DIAGNOSIS — F250 Schizoaffective disorder, bipolar type: Secondary | ICD-10-CM

## 2018-06-27 MED ORDER — CARIPRAZINE HCL 3 MG PO CAPS: 3.0000 mg | ORAL_CAPSULE | Freq: Every day | ORAL | 0 refills | Status: DC

## 2018-06-27 MED ORDER — SERTRALINE HCL 100 MG PO TABS: 200.0000 mg | ORAL_TABLET | Freq: Every day | ORAL | 0 refills | Status: DC

## 2018-06-27 MED ORDER — ALPRAZOLAM 0.5 MG PO TABS: 0.5000 mg | ORAL_TABLET | Freq: Two times a day (BID) | ORAL | 0 refills | Status: DC | PRN

## 2018-06-27 NOTE — Patient Instructions (Addendum)
1. Continue Vraylar 3 mg at night 2. Continuesertraline200mg  daily 3. Decrease prazosin 4 mg at night for three days, then 2 mg at night for three nights, then discontinue 4. Continue Xanax 0.5 m mg twice a day as needed for anxiety  5. (Discontinue lithium) 6. Return to clinic in one month for 30 mins

## 2018-06-28 ENCOUNTER — Encounter (HOSPITAL_COMMUNITY): Payer: Self-pay | Admitting: Emergency Medicine

## 2018-06-28 ENCOUNTER — Observation Stay (HOSPITAL_COMMUNITY)
Admission: EM | Admit: 2018-06-28 | Discharge: 2018-06-30 | Disposition: A | Discharge status: Home / Self Care | Payer: No Typology Code available for payment source | Attending: Surgery | Admitting: Surgery

## 2018-06-28 DIAGNOSIS — Z87891 Personal history of nicotine dependence: Secondary | ICD-10-CM | POA: Diagnosis not present

## 2018-06-28 DIAGNOSIS — K8020 Calculus of gallbladder without cholecystitis without obstruction: Secondary | ICD-10-CM | POA: Diagnosis present

## 2018-06-28 DIAGNOSIS — Z881 Allergy status to other antibiotic agents status: Secondary | ICD-10-CM | POA: Diagnosis not present

## 2018-06-28 DIAGNOSIS — K8010 Calculus of gallbladder with chronic cholecystitis without obstruction: Secondary | ICD-10-CM | POA: Diagnosis not present

## 2018-06-28 DIAGNOSIS — K819 Cholecystitis, unspecified: Secondary | ICD-10-CM | POA: Diagnosis present

## 2018-06-28 DIAGNOSIS — F4310 Post-traumatic stress disorder, unspecified: Secondary | ICD-10-CM | POA: Insufficient documentation

## 2018-06-28 DIAGNOSIS — Z79899 Other long term (current) drug therapy: Secondary | ICD-10-CM | POA: Insufficient documentation

## 2018-06-28 DIAGNOSIS — Z888 Allergy status to other drugs, medicaments and biological substances status: Secondary | ICD-10-CM | POA: Diagnosis not present

## 2018-06-28 DIAGNOSIS — Z6841 Body Mass Index (BMI) 40.0 and over, adult: Secondary | ICD-10-CM | POA: Insufficient documentation

## 2018-06-28 DIAGNOSIS — K219 Gastro-esophageal reflux disease without esophagitis: Secondary | ICD-10-CM | POA: Diagnosis not present

## 2018-06-28 DIAGNOSIS — F419 Anxiety disorder, unspecified: Secondary | ICD-10-CM | POA: Diagnosis not present

## 2018-06-28 DIAGNOSIS — Z8719 Personal history of other diseases of the digestive system: Secondary | ICD-10-CM | POA: Diagnosis not present

## 2018-06-28 DIAGNOSIS — M1990 Unspecified osteoarthritis, unspecified site: Secondary | ICD-10-CM | POA: Diagnosis not present

## 2018-06-28 DIAGNOSIS — F250 Schizoaffective disorder, bipolar type: Secondary | ICD-10-CM | POA: Diagnosis not present

## 2018-06-28 DIAGNOSIS — F410 Panic disorder [episodic paroxysmal anxiety] without agoraphobia: Secondary | ICD-10-CM | POA: Insufficient documentation

## 2018-06-28 DIAGNOSIS — Z419 Encounter for procedure for purposes other than remedying health state, unspecified: Secondary | ICD-10-CM

## 2018-06-28 DIAGNOSIS — Z8249 Family history of ischemic heart disease and other diseases of the circulatory system: Secondary | ICD-10-CM | POA: Diagnosis not present

## 2018-06-28 DIAGNOSIS — Z8711 Personal history of peptic ulcer disease: Secondary | ICD-10-CM | POA: Insufficient documentation

## 2018-06-28 DIAGNOSIS — K810 Acute cholecystitis: Secondary | ICD-10-CM | POA: Diagnosis present

## 2018-06-28 DIAGNOSIS — R52 Pain, unspecified: Secondary | ICD-10-CM

## 2018-06-28 MED ORDER — FENTANYL CITRATE (PF) 100 MCG/2ML IJ SOLN
50.0000 ug | INTRAMUSCULAR | Status: AC | PRN
  Administered 2018-06-28 – 2018-06-29 (×2): 50 ug via NASAL
  Filled 2018-06-28 (×2): qty 2

## 2018-06-28 NOTE — ED Triage Notes (Signed)
Pt has had right sided abdominal pain that moves to her back for over a month however tonight she is screaming out in pain. Pt is nauseas.  Pain is "like having a baby"

## 2018-06-29 ENCOUNTER — Inpatient Hospital Stay (HOSPITAL_COMMUNITY): Payer: No Typology Code available for payment source

## 2018-06-29 ENCOUNTER — Emergency Department (HOSPITAL_COMMUNITY): Payer: No Typology Code available for payment source

## 2018-06-29 ENCOUNTER — Other Ambulatory Visit: Payer: Self-pay

## 2018-06-29 ENCOUNTER — Inpatient Hospital Stay (HOSPITAL_COMMUNITY): Payer: No Typology Code available for payment source | Admitting: Certified Registered Nurse Anesthetist

## 2018-06-29 ENCOUNTER — Encounter (HOSPITAL_COMMUNITY): Payer: Self-pay | Admitting: General Practice

## 2018-06-29 ENCOUNTER — Encounter (HOSPITAL_COMMUNITY): Admission: EM | Disposition: A | Discharge status: Home / Self Care | Payer: Self-pay | Source: Home / Self Care

## 2018-06-29 DIAGNOSIS — K810 Acute cholecystitis: Secondary | ICD-10-CM | POA: Diagnosis present

## 2018-06-29 DIAGNOSIS — K819 Cholecystitis, unspecified: Secondary | ICD-10-CM | POA: Diagnosis present

## 2018-06-29 HISTORY — PX: CHOLECYSTECTOMY: SHX55

## 2018-06-29 LAB — URINALYSIS, ROUTINE W REFLEX MICROSCOPIC
BILIRUBIN URINE: NEGATIVE
Glucose, UA: NEGATIVE mg/dL
Hgb urine dipstick: NEGATIVE
KETONES UR: NEGATIVE mg/dL
LEUKOCYTES UA: NEGATIVE
Nitrite: NEGATIVE
PROTEIN: NEGATIVE mg/dL
Specific Gravity, Urine: 1.02 (ref 1.005–1.030)
pH: 6 (ref 5.0–8.0)

## 2018-06-29 LAB — CBC
HCT: 43.8 % (ref 36.0–46.0)
HEMOGLOBIN: 14.1 g/dL (ref 12.0–15.0)
MCH: 30.5 pg (ref 26.0–34.0)
MCHC: 32.2 g/dL (ref 30.0–36.0)
MCV: 94.8 fL (ref 78.0–100.0)
Platelets: 305 10*3/uL (ref 150–400)
RBC: 4.62 MIL/uL (ref 3.87–5.11)
RDW: 12.8 % (ref 11.5–15.5)
WBC: 8 10*3/uL (ref 4.0–10.5)

## 2018-06-29 LAB — COMPREHENSIVE METABOLIC PANEL
ALK PHOS: 68 U/L (ref 38–126)
ALT: 25 U/L (ref 0–44)
ANION GAP: 10 (ref 5–15)
AST: 22 U/L (ref 15–41)
Albumin: 4.1 g/dL (ref 3.5–5.0)
BUN: 15 mg/dL (ref 6–20)
CALCIUM: 9.1 mg/dL (ref 8.9–10.3)
CHLORIDE: 107 mmol/L (ref 98–111)
CO2: 23 mmol/L (ref 22–32)
CREATININE: 0.99 mg/dL (ref 0.44–1.00)
GFR calc Af Amer: >60 mL/min (ref >60)
Glucose, Bld: 109 mg/dL — ABNORMAL HIGH (ref 70–99)
Potassium: 4 mmol/L (ref 3.5–5.1)
SODIUM: 140 mmol/L (ref 135–145)
Total Bilirubin: 0.4 mg/dL (ref 0.3–1.2)
Total Protein: 6.7 g/dL (ref 6.5–8.1)

## 2018-06-29 LAB — POC OCCULT BLOOD, ED: Fecal Occult Bld: NEGATIVE

## 2018-06-29 LAB — LIPASE, BLOOD: LIPASE: 54 U/L — AB (ref 11–51)

## 2018-06-29 LAB — I-STAT BETA HCG BLOOD, ED (MC, WL, AP ONLY)

## 2018-06-29 SURGERY — LAPAROSCOPIC CHOLECYSTECTOMY WITH INTRAOPERATIVE CHOLANGIOGRAM
Anesthesia: General | Site: Abdomen

## 2018-06-29 MED ORDER — FENTANYL CITRATE (PF) 100 MCG/2ML IJ SOLN
100.0000 ug | Freq: Once | INTRAMUSCULAR | Status: AC
  Administered 2018-06-29: 100 ug via INTRAVENOUS
  Filled 2018-06-29: qty 2

## 2018-06-29 MED ORDER — NICOTINE 21 MG/24HR TD PT24: 21.0000 mg | MEDICATED_PATCH | Freq: Every day | TRANSDERMAL | Status: DC

## 2018-06-29 MED ORDER — SODIUM CHLORIDE 0.9 % IV BOLUS (SEPSIS)
1000.0000 mL | Freq: Once | INTRAVENOUS | Status: AC
  Administered 2018-06-29: 1000 mL via INTRAVENOUS

## 2018-06-29 MED ORDER — STERILE WATER FOR IRRIGATION IR SOLN
Status: DC | PRN
  Administered 2018-06-29: 1000 mL

## 2018-06-29 MED ORDER — HYDRALAZINE HCL 20 MG/ML IJ SOLN: 10.0000 mg | INTRAMUSCULAR | Status: DC | PRN

## 2018-06-29 MED ORDER — FENTANYL CITRATE (PF) 100 MCG/2ML IJ SOLN
INTRAMUSCULAR | Status: DC | PRN
  Administered 2018-06-29: 25 ug via INTRAVENOUS
  Administered 2018-06-29: 75 ug via INTRAVENOUS
  Administered 2018-06-29 (×2): 25 ug via INTRAVENOUS
  Administered 2018-06-29: 50 ug via INTRAVENOUS
  Administered 2018-06-29 (×2): 25 ug via INTRAVENOUS

## 2018-06-29 MED ORDER — DIPHENHYDRAMINE HCL 50 MG/ML IJ SOLN: 12.5000 mg | Freq: Four times a day (QID) | INTRAMUSCULAR | Status: DC | PRN

## 2018-06-29 MED ORDER — ACETAMINOPHEN 500 MG PO TABS
1000.0000 mg | ORAL_TABLET | Freq: Four times a day (QID) | ORAL | Status: DC
  Administered 2018-06-29: 1000 mg via ORAL
  Filled 2018-06-29: qty 2

## 2018-06-29 MED ORDER — BUPIVACAINE-EPINEPHRINE 0.25% -1:200000 IJ SOLN
INTRAMUSCULAR | Status: DC | PRN
  Administered 2018-06-29: 30 mL

## 2018-06-29 MED ORDER — ACETAMINOPHEN 325 MG PO TABS
650.0000 mg | ORAL_TABLET | Freq: Four times a day (QID) | ORAL | Status: DC | PRN
  Administered 2018-06-29: 650 mg via ORAL
  Filled 2018-06-29: qty 2

## 2018-06-29 MED ORDER — MIDAZOLAM HCL 2 MG/2ML IJ SOLN
INTRAMUSCULAR | Status: DC | PRN
  Administered 2018-06-29: 2 mg via INTRAVENOUS
  Administered 2018-06-29: 1 mg via INTRAVENOUS

## 2018-06-29 MED ORDER — LACTATED RINGERS IV SOLN
INTRAVENOUS | Status: DC | PRN
  Administered 2018-06-29: 09:00:00 via INTRAVENOUS

## 2018-06-29 MED ORDER — OXYCODONE HCL 5 MG PO TABS
5.0000 mg | ORAL_TABLET | Freq: Four times a day (QID) | ORAL | Status: DC | PRN
  Administered 2018-06-29: 5 mg via ORAL
  Filled 2018-06-29: qty 1

## 2018-06-29 MED ORDER — OXYCODONE HCL 5 MG PO TABS
5.0000 mg | ORAL_TABLET | ORAL | Status: DC | PRN
  Administered 2018-06-29 – 2018-06-30 (×5): 5 mg via ORAL
  Filled 2018-06-29 (×5): qty 1

## 2018-06-29 MED ORDER — DOCUSATE SODIUM 100 MG PO CAPS: 200.0000 mg | ORAL_CAPSULE | Freq: Two times a day (BID) | ORAL | Status: DC

## 2018-06-29 MED ORDER — PIPERACILLIN-TAZOBACTAM 3.375 G IVPB
3.3750 g | Freq: Three times a day (TID) | INTRAVENOUS | Status: DC
  Administered 2018-06-29 (×2): 3.375 g via INTRAVENOUS
  Filled 2018-06-29: qty 50

## 2018-06-29 MED ORDER — ONDANSETRON HCL 4 MG/2ML IJ SOLN
INTRAMUSCULAR | Status: DC | PRN
  Administered 2018-06-29: 4 mg via INTRAVENOUS

## 2018-06-29 MED ORDER — PRAZOSIN HCL 2 MG PO CAPS: 6.0000 mg | ORAL_CAPSULE | Freq: Every day | ORAL | Status: DC

## 2018-06-29 MED ORDER — ROCURONIUM BROMIDE 10 MG/ML (PF) SYRINGE
PREFILLED_SYRINGE | INTRAVENOUS | Status: DC | PRN
  Administered 2018-06-29: 5 mg via INTRAVENOUS
  Administered 2018-06-29: 50 mg via INTRAVENOUS

## 2018-06-29 MED ORDER — FENTANYL CITRATE (PF) 100 MCG/2ML IJ SOLN
INTRAMUSCULAR | Status: AC
  Filled 2018-06-29: qty 2

## 2018-06-29 MED ORDER — ONDANSETRON 4 MG PO TBDP: 4.0000 mg | ORAL_TABLET | Freq: Four times a day (QID) | ORAL | Status: DC | PRN

## 2018-06-29 MED ORDER — MORPHINE SULFATE (PF) 2 MG/ML IV SOLN
1.0000 mg | INTRAVENOUS | Status: DC | PRN
  Administered 2018-06-29: 2 mg via INTRAVENOUS
  Filled 2018-06-29: qty 1

## 2018-06-29 MED ORDER — ALBUTEROL SULFATE HFA 108 (90 BASE) MCG/ACT IN AERS: 2.0000 | INHALATION_SPRAY | Freq: Four times a day (QID) | RESPIRATORY_TRACT | Status: DC | PRN

## 2018-06-29 MED ORDER — HEPARIN SODIUM (PORCINE) 5000 UNIT/ML IJ SOLN
5000.0000 [IU] | Freq: Three times a day (TID) | INTRAMUSCULAR | Status: DC
  Administered 2018-06-29: 5000 [IU] via SUBCUTANEOUS
  Filled 2018-06-29: qty 1

## 2018-06-29 MED ORDER — IBUPROFEN 400 MG PO TABS: 600.0000 mg | ORAL_TABLET | Freq: Four times a day (QID) | ORAL | Status: DC | PRN

## 2018-06-29 MED ORDER — SERTRALINE HCL 100 MG PO TABS
200.0000 mg | ORAL_TABLET | Freq: Every day | ORAL | Status: DC
  Filled 2018-06-29: qty 2

## 2018-06-29 MED ORDER — HEPARIN SODIUM (PORCINE) 5000 UNIT/ML IJ SOLN
5000.0000 [IU] | Freq: Three times a day (TID) | INTRAMUSCULAR | Status: DC
  Administered 2018-06-29 – 2018-06-30 (×2): 5000 [IU] via SUBCUTANEOUS
  Filled 2018-06-29 (×2): qty 1

## 2018-06-29 MED ORDER — KCL IN DEXTROSE-NACL 20-5-0.45 MEQ/L-%-% IV SOLN
INTRAVENOUS | Status: DC
  Administered 2018-06-29 – 2018-06-30 (×2): via INTRAVENOUS
  Filled 2018-06-29 (×2): qty 1000

## 2018-06-29 MED ORDER — PHENYLEPHRINE 40 MCG/ML (10ML) SYRINGE FOR IV PUSH (FOR BLOOD PRESSURE SUPPORT)
PREFILLED_SYRINGE | INTRAVENOUS | Status: DC | PRN
  Administered 2018-06-29: 40 ug via INTRAVENOUS

## 2018-06-29 MED ORDER — TRAMADOL HCL 50 MG PO TABS
50.0000 mg | ORAL_TABLET | Freq: Four times a day (QID) | ORAL | Status: DC | PRN
  Administered 2018-06-29: 50 mg via ORAL
  Filled 2018-06-29 (×2): qty 1

## 2018-06-29 MED ORDER — ONDANSETRON HCL 4 MG/2ML IJ SOLN
4.0000 mg | Freq: Four times a day (QID) | INTRAMUSCULAR | Status: DC | PRN
  Administered 2018-06-29: 4 mg via INTRAVENOUS
  Filled 2018-06-29: qty 2

## 2018-06-29 MED ORDER — HYDROMORPHONE HCL 1 MG/ML IJ SOLN: 0.5000 mg | INTRAMUSCULAR | Status: DC | PRN

## 2018-06-29 MED ORDER — DIPHENHYDRAMINE HCL 12.5 MG/5ML PO ELIX: 12.5000 mg | ORAL_SOLUTION | Freq: Four times a day (QID) | ORAL | Status: DC | PRN

## 2018-06-29 MED ORDER — SIMETHICONE 80 MG PO CHEW
40.0000 mg | CHEWABLE_TABLET | Freq: Four times a day (QID) | ORAL | Status: DC | PRN
  Filled 2018-06-29: qty 1

## 2018-06-29 MED ORDER — DEXMEDETOMIDINE HCL 200 MCG/2ML IV SOLN
INTRAVENOUS | Status: DC | PRN
  Administered 2018-06-29 (×9): 4 ug via INTRAVENOUS

## 2018-06-29 MED ORDER — ONDANSETRON HCL 4 MG/2ML IJ SOLN: 4.0000 mg | Freq: Once | INTRAMUSCULAR | Status: DC | PRN

## 2018-06-29 MED ORDER — SODIUM CHLORIDE 0.9 % IR SOLN
Status: DC | PRN
  Administered 2018-06-29: 1000 mL

## 2018-06-29 MED ORDER — HYDROCODONE-ACETAMINOPHEN 5-325 MG PO TABS
1.0000 | ORAL_TABLET | ORAL | Status: DC | PRN
  Administered 2018-06-29: 2 via ORAL
  Filled 2018-06-29: qty 2

## 2018-06-29 MED ORDER — SUGAMMADEX SODIUM 200 MG/2ML IV SOLN
INTRAVENOUS | Status: DC | PRN
  Administered 2018-06-29: 200 mg via INTRAVENOUS

## 2018-06-29 MED ORDER — LIDOCAINE 2% (20 MG/ML) 5 ML SYRINGE
INTRAMUSCULAR | Status: DC | PRN
  Administered 2018-06-29: 60 mg via INTRAVENOUS

## 2018-06-29 MED ORDER — SODIUM CHLORIDE 0.9 % IV SOLN
INTRAVENOUS | Status: DC | PRN
  Administered 2018-06-29: 15 mL

## 2018-06-29 MED ORDER — 0.9 % SODIUM CHLORIDE (POUR BTL) OPTIME
TOPICAL | Status: DC | PRN
  Administered 2018-06-29: 1000 mL

## 2018-06-29 MED ORDER — PROPOFOL 10 MG/ML IV BOLUS
INTRAVENOUS | Status: DC | PRN
  Administered 2018-06-29: 200 mg via INTRAVENOUS

## 2018-06-29 MED ORDER — DEXAMETHASONE SODIUM PHOSPHATE 10 MG/ML IJ SOLN
INTRAMUSCULAR | Status: DC | PRN
  Administered 2018-06-29: 10 mg via INTRAVENOUS

## 2018-06-29 MED ORDER — METHOCARBAMOL 500 MG PO TABS
500.0000 mg | ORAL_TABLET | Freq: Three times a day (TID) | ORAL | Status: DC | PRN
  Administered 2018-06-29: 500 mg via ORAL
  Filled 2018-06-29: qty 1

## 2018-06-29 MED ORDER — EPHEDRINE SULFATE-NACL 50-0.9 MG/10ML-% IV SOSY
PREFILLED_SYRINGE | INTRAVENOUS | Status: DC | PRN
  Administered 2018-06-29 (×3): 5 mg via INTRAVENOUS

## 2018-06-29 MED ORDER — ONDANSETRON HCL 4 MG/2ML IJ SOLN
4.0000 mg | Freq: Once | INTRAMUSCULAR | Status: AC
  Administered 2018-06-29: 4 mg via INTRAVENOUS
  Filled 2018-06-29: qty 2

## 2018-06-29 MED ORDER — LACTATED RINGERS IV SOLN
INTRAVENOUS | Status: DC
  Administered 2018-06-29: 06:00:00 via INTRAVENOUS

## 2018-06-29 MED ORDER — ONDANSETRON HCL 4 MG/2ML IJ SOLN: 4.0000 mg | Freq: Four times a day (QID) | INTRAMUSCULAR | Status: DC | PRN

## 2018-06-29 MED ORDER — FENTANYL CITRATE (PF) 100 MCG/2ML IJ SOLN
25.0000 ug | INTRAMUSCULAR | Status: DC | PRN
  Administered 2018-06-29 (×2): 50 ug via INTRAVENOUS

## 2018-06-29 MED ORDER — CELECOXIB 200 MG PO CAPS
200.0000 mg | ORAL_CAPSULE | Freq: Two times a day (BID) | ORAL | Status: DC
  Administered 2018-06-29 – 2018-06-30 (×3): 200 mg via ORAL
  Filled 2018-06-29 (×3): qty 1

## 2018-06-29 MED ORDER — HYDROMORPHONE HCL 1 MG/ML IJ SOLN
0.5000 mg | INTRAMUSCULAR | Status: DC | PRN
Start: 2018-06-29 — End: 2018-06-29

## 2018-06-29 SURGICAL SUPPLY — 48 items
ADH SKN CLS APL DERMABOND .7 (GAUZE/BANDAGES/DRESSINGS) ×1
APPLIER CLIP 5 13 M/L LIGAMAX5 (MISCELLANEOUS) ×3
APR CLP MED LRG 5 ANG JAW (MISCELLANEOUS) ×1
BAG SPEC RTRVL 10 TROC 200 (ENDOMECHANICALS) ×1
CANISTER SUCT 3000ML PPV (MISCELLANEOUS) ×3 IMPLANT
CHLORAPREP W/TINT 26ML (MISCELLANEOUS) ×3 IMPLANT
CHOLANGIOGRAM CATH TAUT (CATHETERS) ×3 IMPLANT
CLIP APPLIE 5 13 M/L LIGAMAX5 (MISCELLANEOUS) IMPLANT
COVER MAYO STAND STRL (DRAPES) ×3 IMPLANT
COVER SURGICAL LIGHT HANDLE (MISCELLANEOUS) ×3 IMPLANT
DERMABOND ADVANCED (GAUZE/BANDAGES/DRESSINGS) ×2
DERMABOND ADVANCED .7 DNX12 (GAUZE/BANDAGES/DRESSINGS) ×1 IMPLANT
DRAPE C-ARM 42X72 X-RAY (DRAPES) ×3 IMPLANT
ELECT REM PT RETURN 9FT ADLT (ELECTROSURGICAL) ×3
ELECTRODE REM PT RTRN 9FT ADLT (ELECTROSURGICAL) ×1 IMPLANT
FILTER SMOKE EVAC LAPAROSHD (FILTER) ×3 IMPLANT
GLOVE BIOGEL PI IND STRL 6.5 (GLOVE) IMPLANT
GLOVE BIOGEL PI IND STRL 7.5 (GLOVE) IMPLANT
GLOVE BIOGEL PI INDICATOR 6.5 (GLOVE) ×2
GLOVE BIOGEL PI INDICATOR 7.5 (GLOVE) ×2
GLOVE ECLIPSE 7.5 STRL STRAW (GLOVE) ×2 IMPLANT
GLOVE SURG SIGNA 7.5 PF LTX (GLOVE) ×3 IMPLANT
GLOVE SURG SS PI 6.5 STRL IVOR (GLOVE) ×2 IMPLANT
GOWN STRL REUS W/ TWL LRG LVL3 (GOWN DISPOSABLE) ×2 IMPLANT
GOWN STRL REUS W/ TWL XL LVL3 (GOWN DISPOSABLE) ×1 IMPLANT
GOWN STRL REUS W/TWL LRG LVL3 (GOWN DISPOSABLE) ×6
GOWN STRL REUS W/TWL XL LVL3 (GOWN DISPOSABLE) ×3
IV CATH 14GX2 1/4 (CATHETERS) ×3 IMPLANT
KIT BASIN OR (CUSTOM PROCEDURE TRAY) ×3 IMPLANT
KIT TURNOVER KIT B (KITS) ×3 IMPLANT
NS IRRIG 1000ML POUR BTL (IV SOLUTION) ×3 IMPLANT
PAD ARMBOARD 7.5X6 YLW CONV (MISCELLANEOUS) ×3 IMPLANT
POUCH RETRIEVAL ECOSAC 10 (ENDOMECHANICALS) ×1 IMPLANT
POUCH RETRIEVAL ECOSAC 10MM (ENDOMECHANICALS) ×2
SCISSORS LAP 5X35 DISP (ENDOMECHANICALS) ×3 IMPLANT
SET IRRIG TUBING LAPAROSCOPIC (IRRIGATION / IRRIGATOR) ×3 IMPLANT
SLEEVE ENDOPATH XCEL 5M (ENDOMECHANICALS) ×3 IMPLANT
SPECIMEN JAR SMALL (MISCELLANEOUS) ×3 IMPLANT
STOPCOCK 4 WAY LG BORE MALE ST (IV SETS) ×3 IMPLANT
SUT MNCRL AB 4-0 PS2 18 (SUTURE) ×3 IMPLANT
TOWEL GREEN STERILE (TOWEL DISPOSABLE) ×2 IMPLANT
TRAY LAPAROSCOPIC MC (CUSTOM PROCEDURE TRAY) ×3 IMPLANT
TROCAR XCEL BLUNT TIP 100MML (ENDOMECHANICALS) ×3 IMPLANT
TROCAR XCEL NON-BLD 11X100MML (ENDOMECHANICALS) ×2 IMPLANT
TROCAR XCEL NON-BLD 5MMX100MML (ENDOMECHANICALS) ×3 IMPLANT
TUBING EXTENTION W/L.L. (IV SETS) ×3 IMPLANT
TUBING INSUFFLATION (TUBING) ×3 IMPLANT
WATER STERILE IRR 1000ML POUR (IV SOLUTION) ×3 IMPLANT

## 2018-06-29 NOTE — Progress Notes (Signed)
Chart reviewed and patient examined. Agree with diagnosis of cholecystitis. Plan lap chole with IOC this AM.  I discussed with the patient the indications and risks of gall bladder surgery.  The primary risks of gall bladder surgery include, but are not limited to, bleeding, infection, common bile duct injury, and open surgery.  There is also the risk that the patient may have continued symptoms after surgery.  We discussed the typical post-operative recovery course. I tried to answer the patient's questions.  She is bipolar and blames her bipolar med for her weight issues. She is divorced.  Has 2 daughters, 67 and 79 yo that live with her. She is a Quarry manager for [employer].  Alphonsa Overall, MD, [facility] Pager: [phone] Office phone:  [phone]

## 2018-06-29 NOTE — Transfer of Care (Signed)
Immediate Anesthesia Transfer of Care Note  Patient: Deborah Pena  Procedure(s) Performed: LAPAROSCOPIC CHOLECYSTECTOMY WITH INTRAOPERATIVE CHOLANGIOGRAM (N/A Abdomen)  Patient Location: PACU  Anesthesia Type:General  Level of Consciousness: awake, alert  and oriented  Airway & Oxygen Therapy: Patient Spontanous Breathing and Patient connected to nasal cannula oxygen  Post-op Assessment: Report given to RN, Post -op Vital signs reviewed and stable, Patient moving all extremities and Patient moving all extremities X 4  Post vital signs: Reviewed and stable  Last Vitals:  Vitals Value Taken Time  BP 139/72 06/29/2018 10:24 AM  Temp 36.1 C 06/29/2018 10:24 AM  Pulse 74 06/29/2018 10:29 AM  Resp 20 06/29/2018 10:29 AM  SpO2 97 % 06/29/2018 10:29 AM  Vitals shown include unvalidated device data.  Last Pain:  Vitals:   06/29/18 0716  TempSrc:   PainSc: 7          Complications: No apparent anesthesia complications

## 2018-06-29 NOTE — H&P (Signed)
CC: RUQ abdominal pain  HPI: Deborah Pena is an 52 y.o. female with hx of anxiety, chronic back pain, GERD, obesity who is here with 7d hx of severe RUQ that has persisted for this entire time. Gets better/worse but won't go away. Associated with fever/chills/n/v at home. Never had this prior to a week ago. States the pain radiates across her upper abdomen. Denies any other complaints.  She works as a Quarry manager in hospice  [place]: C-sx via Pfannenstiel; robotic hysterectomy  Past Medical History:  Diagnosis Date  . Anemia   . Anginal pain ([place])   . Anxiety   . Arthritis    "left hand" (04/26/2016)  . Asthma   . Chronic lower back pain   . Diverticulitis of colon   . GERD (gastroesophageal reflux disease)   . Headache    'once or twice/week" (04/26/2016)  . Migraine    "twice/month" (04/26/2016)  . Stomach ulcer     Past Surgical History:  Procedure Laterality Date  . ABDOMINAL HYSTERECTOMY  2013   preformed at [facility] by Dr Jacqualyn Posey in April  . BREAST SURGERY Bilateral ~ 2000   "dual lateral duct removal"   . BUNIONECTOMY Right 2013  . CESAREAN SECTION  1999  . OOPHORECTOMY Right 2013  . TONSILLECTOMY      Family History  Problem Relation Age of Onset  . Heart failure Father   . Bipolar disorder Father   . Schizophrenia Father   . Diabetes Other   . Breast cancer Other   . Diverticulitis Other   . Mental illness Paternal Grandmother   . Mental illness Paternal Grandfather   . Schizophrenia Paternal Aunt   . Suicidality Maternal Aunt     Social:  reports that she has quit smoking. Her smoking use included cigarettes. She has a 60.00 pack-year smoking history. She has quit using smokeless tobacco. Her smokeless tobacco use included chew. She reports that she does not drink alcohol or use drugs.  Allergies:  Allergies  Allergen Reactions  . Lamictal [Lamotrigine] Shortness Of Breath, Rash and Other (See Comments)    Swollen lymph nodes  . Lithium Anaphylaxis  .  Buprenorphine Hcl Other (See Comments)    "Becomes psychotic"--per notes from another healthcare network  . Ciprofloxacin Itching and Other (See Comments)    Sore throat, hoarseness  . Propranolol Other (See Comments)    Syncope    Medications: I have reviewed the patient's current medications.  Results for orders placed or performed during the hospital encounter of 06/28/18 (from the past 48 hour(s))  Lipase, blood     Status: Abnormal   Collection Time: 06/28/18 11:38 PM  Result Value Ref Range   Lipase 54 (H) 11 - 51 U/L    Comment: Performed at [facility] [address], [place], [place] [address]  Comprehensive metabolic panel     Status: Abnormal   Collection Time: 06/28/18 11:38 PM  Result Value Ref Range   Sodium 140 135 - 145 mmol/L   Potassium 4.0 3.5 - 5.1 mmol/L   Chloride 107 98 - 111 mmol/L    Comment: Please note change in reference range.   CO2 23 22 - 32 mmol/L   Glucose, Bld 109 (H) 70 - 99 mg/dL    Comment: Please note change in reference range.   BUN 15 6 - 20 mg/dL    Comment: Please note change in reference range.   Creatinine, Ser 0.99 0.44 - 1.00 mg/dL   Calcium 9.1  8.9 - 10.3 mg/dL   Total Protein 6.7 6.5 - 8.1 g/dL   Albumin 4.1 3.5 - 5.0 g/dL   AST 22 15 - 41 U/L   ALT 25 0 - 44 U/L    Comment: Please note change in reference range.   Alkaline Phosphatase 68 38 - 126 U/L   Total Bilirubin 0.4 0.3 - 1.2 mg/dL   GFR calc non Af Amer >60 >60 mL/min   GFR calc Af Amer >60 >60 mL/min    Comment: (NOTE) The eGFR has been calculated using the CKD EPI equation. This calculation has not been validated in all clinical situations. eGFR's persistently <60 mL/min signify possible Chronic Kidney Disease.    Anion gap 10 5 - 15    Comment: Performed at [facility] [address], [place] [address]  CBC     Status: None   Collection Time: 06/28/18 11:38 PM  Result Value Ref Range   WBC 8.0 4.0 - 10.5 K/uL   RBC 4.62 3.87 - 5.11  MIL/uL   Hemoglobin 14.1 12.0 - 15.0 g/dL   HCT 43.8 36.0 - 46.0 %   MCV 94.8 78.0 - 100.0 fL   MCH 30.5 26.0 - 34.0 pg   MCHC 32.2 30.0 - 36.0 g/dL   RDW 12.8 11.5 - 15.5 %   Platelets 305 150 - 400 K/uL    Comment: Performed at [facility] [address], [place], [place] [address]  I-Stat beta hCG blood, ED     Status: None   Collection Time: 06/28/18 11:50 PM  Result Value Ref Range   I-stat hCG, quantitative <5.0 <5 mIU/mL   Comment 3            Comment:   GEST. AGE      CONC.  (mIU/mL)   <=1 WEEK        5 - 50     2 WEEKS       50 - 500     3 WEEKS       100 - 10,000     4 WEEKS     1,000 - 30,000        FEMALE AND NON-PREGNANT FEMALE:     LESS THAN 5 mIU/mL   Urinalysis, Routine w reflex microscopic     Status: None   Collection Time: 06/29/18 12:39 AM  Result Value Ref Range   Color, Urine YELLOW YELLOW   APPearance CLEAR CLEAR   Specific Gravity, Urine 1.020 1.005 - 1.030   pH 6.0 5.0 - 8.0   Glucose, UA NEGATIVE NEGATIVE mg/dL   Hgb urine dipstick NEGATIVE NEGATIVE   Bilirubin Urine NEGATIVE NEGATIVE   Ketones, ur NEGATIVE NEGATIVE mg/dL   Protein, ur NEGATIVE NEGATIVE mg/dL   Nitrite NEGATIVE NEGATIVE   Leukocytes, UA NEGATIVE NEGATIVE    Comment: Performed at [facility], [place] [address] [place], [place] [address]  POC occult blood, ED     Status: None   Collection Time: 06/29/18 12:54 AM  Result Value Ref Range   Fecal Occult Bld NEGATIVE NEGATIVE    Dg Chest 2 View  Result Date: 06/29/2018 CLINICAL DATA:  Chest pain.  Vomiting for a couple of hours EXAM: CHEST - 2 VIEW COMPARISON:  10/09/2017 FINDINGS: Borderline heart size on the AP view but normal appearing laterally. There is no edema, consolidation, effusion, or pneumothorax. Normal heart size and aortic contours. IMPRESSION: No evidence of active disease. Electronically Signed  By: Monte Fantasia M.D.   On: 06/29/2018 01:28   US Abdomen Limited Ruq  Result Date: 06/29/2018 CLINICAL  DATA:  Right upper quadrant pain for 2 months EXAM: ULTRASOUND ABDOMEN LIMITED RIGHT UPPER QUADRANT COMPARISON:  04/26/2016 abdominal CT FINDINGS: Gallbladder: Dependent shadowing gallstones, including stone fixed at the neck measuring 2 cm. Focal tenderness per sonographer exam, without gallbladder wall thickening or edema. Common bile duct: Diameter: 3 mm.  Where visualized, no filling defect. Liver: No focal lesion identified. Within normal limits in parenchymal echogenicity. Portal vein is patent on color Doppler imaging with normal direction of blood flow towards the liver. IMPRESSION: Cholelithiasis including nonmobile stone in the gallbladder neck. Focal gallbladder tenderness may reflect gallbladder obstruction, but no wall inflammatory changes typical of acute cholecystitis. Electronically Signed   By: Monte Fantasia M.D.   On: 06/29/2018 01:50    ROS - all of the below systems have been reviewed with the patient and positives are indicated with bold text General: chills, fever or night sweats Eyes: blurry vision or double vision ENT: epistaxis or sore throat Allergy/Immunology: itchy/watery eyes or nasal congestion Hematologic/Lymphatic: bleeding problems, blood clots or swollen lymph nodes Endocrine: temperature intolerance or unexpected weight changes Breast: new or changing breast lumps or nipple discharge Resp: cough, shortness of breath, or wheezing CV: chest pain or dyspnea on exertion GI: as per HPI GU: dysuria, trouble voiding, or hematuria MSK: joint pain or joint stiffness Neuro: TIA or stroke symptoms Derm: pruritus and skin lesion changes Psych: anxiety and depression  PE Blood pressure 135/72, pulse 73, temperature 98.6 F (37 C), temperature source Oral, resp. rate 15, height 5' 3" (1.6 m), weight 104.3 kg (230 lb), SpO2 99 %. Constitutional: NAD; conversant; no deformities Eyes: Moist conjunctiva; no lid lag; anicteric; PERRL Neck: Trachea midline; no  thyromegaly Lungs: Normal respiratory effort; no tactile fremitus CV: RRR; no palpable thrills; no pitting edema GI: Abd obese, soft, ttp in RUQ and MEG; no palpable hepatosplenomegaly; +Murphy's MSK: Normal gait; no clubbing/cyanosis Psychiatric: Appropriate affect; alert and oriented x3 Lymphatic: No palpable cervical or axillary lymphadenopathy  Results for orders placed or performed during the hospital encounter of 06/28/18 (from the past 48 hour(s))  Lipase, blood     Status: Abnormal   Collection Time: 06/28/18 11:38 PM  Result Value Ref Range   Lipase 54 (H) 11 - 51 U/L    Comment: Performed at [facility], [place] [address], [place], [place] [address]  Comprehensive metabolic panel     Status: Abnormal   Collection Time: 06/28/18 11:38 PM  Result Value Ref Range   Sodium 140 135 - 145 mmol/L   Potassium 4.0 3.5 - 5.1 mmol/L   Chloride 107 98 - 111 mmol/L    Comment: Please note change in reference range.   CO2 23 22 - 32 mmol/L   Glucose, Bld 109 (H) 70 - 99 mg/dL    Comment: Please note change in reference range.   BUN 15 6 - 20 mg/dL    Comment: Please note change in reference range.   Creatinine, Ser 0.99 0.44 - 1.00 mg/dL   Calcium 9.1 8.9 - 10.3 mg/dL   Total Protein 6.7 6.5 - 8.1 g/dL   Albumin 4.1 3.5 - 5.0 g/dL   AST 22 15 - 41 U/L   ALT 25 0 - 44 U/L    Comment: Please note change in reference range.   Alkaline Phosphatase 68 38 - 126 U/L   Total Bilirubin  0.4 0.3 - 1.2 mg/dL   GFR calc non Af Amer >60 >60 mL/min   GFR calc Af Amer >60 >60 mL/min    Comment: (NOTE) The eGFR has been calculated using the CKD EPI equation. This calculation has not been validated in all clinical situations. eGFR's persistently <60 mL/min signify possible Chronic Kidney Disease.    Anion gap 10 5 - 15    Comment: Performed at [facility] [address], [place] [address]  CBC     Status: None   Collection Time: 06/28/18 11:38 PM  Result Value Ref Range    WBC 8.0 4.0 - 10.5 K/uL   RBC 4.62 3.87 - 5.11 MIL/uL   Hemoglobin 14.1 12.0 - 15.0 g/dL   HCT 43.8 36.0 - 46.0 %   MCV 94.8 78.0 - 100.0 fL   MCH 30.5 26.0 - 34.0 pg   MCHC 32.2 30.0 - 36.0 g/dL   RDW 12.8 11.5 - 15.5 %   Platelets 305 150 - 400 K/uL    Comment: Performed at [facility] [address] [place], [place] [address]  I-Stat beta hCG blood, ED     Status: None   Collection Time: 06/28/18 11:50 PM  Result Value Ref Range   I-stat hCG, quantitative <5.0 <5 mIU/mL   Comment 3            Comment:   GEST. AGE      CONC.  (mIU/mL)   <=1 WEEK        5 - 50     2 WEEKS       50 - 500     3 WEEKS       100 - 10,000     4 WEEKS     1,000 - 30,000        FEMALE AND NON-PREGNANT FEMALE:     LESS THAN 5 mIU/mL   Urinalysis, Routine w reflex microscopic     Status: None   Collection Time: 06/29/18 12:39 AM  Result Value Ref Range   Color, Urine YELLOW YELLOW   APPearance CLEAR CLEAR   Specific Gravity, Urine 1.020 1.005 - 1.030   pH 6.0 5.0 - 8.0   Glucose, UA NEGATIVE NEGATIVE mg/dL   Hgb urine dipstick NEGATIVE NEGATIVE   Bilirubin Urine NEGATIVE NEGATIVE   Ketones, ur NEGATIVE NEGATIVE mg/dL   Protein, ur NEGATIVE NEGATIVE mg/dL   Nitrite NEGATIVE NEGATIVE   Leukocytes, UA NEGATIVE NEGATIVE    Comment: Performed at [facility], [place] [address] [place],  [address]  POC occult blood, ED     Status: None   Collection Time: 06/29/18 12:54 AM  Result Value Ref Range   Fecal Occult Bld NEGATIVE NEGATIVE    Dg Chest 2 View  Result Date: 06/29/2018 CLINICAL DATA:  Chest pain.  Vomiting for a couple of hours EXAM: CHEST - 2 VIEW COMPARISON:  10/09/2017 FINDINGS: Borderline heart size on the AP view but normal appearing laterally. There is no edema, consolidation, effusion, or pneumothorax. Normal heart size and aortic contours. IMPRESSION: No evidence of active disease. Electronically Signed   By: Monte Fantasia M.D.   On: 06/29/2018 01:28   US  Abdomen Limited Ruq  Result Date: 06/29/2018 CLINICAL DATA:  Right upper quadrant pain for 2 months EXAM: ULTRASOUND ABDOMEN LIMITED RIGHT UPPER QUADRANT COMPARISON:  04/26/2016 abdominal CT FINDINGS: Gallbladder: Dependent shadowing gallstones, including stone fixed at the neck measuring 2 cm. Focal tenderness  per sonographer exam, without gallbladder wall thickening or edema. Common bile duct: Diameter: 3 mm.  Where visualized, no filling defect. Liver: No focal lesion identified. Within normal limits in parenchymal echogenicity. Portal vein is patent on color Doppler imaging with normal direction of blood flow towards the liver. IMPRESSION: Cholelithiasis including nonmobile stone in the gallbladder neck. Focal gallbladder tenderness may reflect gallbladder obstruction, but no wall inflammatory changes typical of acute cholecystitis. Electronically Signed   By: Monte Fantasia M.D.   On: 06/29/2018 01:50   A/P: Deborah Pena is an 52 y.o. female with GERD, anxiety, chronic back pain here with likely acute cholecystitis  -Admit to surgery -NPO, IVF -IV Zosyn -Anticipate cholecystectomy this admission - as timing allows -The anatomy and physiology of the hepatobiliary system was discussed at length with the patient. The pathophysiology of gallbladder disease was discussed at length with associated pictures. -The options for treatment were discussed including ongoing observation which may result in subsequent gallbladder complications (infection, pancreatitis, choledocholithiasis, etc). -The planned procedure, material risks (including pain, bleeding, infection, conversion to open procedure, damage to surrounding structures/nerves/vessels/viscus/bile duct, hernia, bile leak, need for additional procedures) benefits and alternatives to surgery were discussed. I noted a good probability that the procedure would help improve their symptoms. The patient's questions were answered to her satisfaction, she  voiced understanding and elected to proceed with surgery. Additionally, we discussed typical postoperative expectations and the recovery process.  Sharon Mt. Dema Severin, M.D. [facility]

## 2018-06-29 NOTE — ED Provider Notes (Signed)
[facility] EMERGENCY DEPARTMENT Provider Note   CSN: [id number] Arrival date & time: 06/28/18  2317     History   Chief Complaint Chief Complaint  Patient presents with  . Abdominal Pain    HPI Deborah Pena is a 52 y.o. female.  The history is provided by the patient.  Abdominal Pain   This is a recurrent problem. The current episode started more than 1 week ago. The problem occurs daily. The problem has been gradually worsening. The pain is located in the RUQ. The quality of the pain is sharp and dull. The pain is severe. Associated symptoms include hematochezia. Pertinent negatives include fever, diarrhea, vomiting and dysuria. The symptoms are aggravated by palpation and certain positions. Nothing relieves the symptoms.  Patient with history of anxiety, chronic back pain, GERD, obesity presents with abdominal pain.  She reports right upper quadrant abdominal pain that radiates to her back for several months.  Reports it first started after coughing and she thought she was having chest wall pain, but now is mostly in her abdomen and is very severe.  She reports the pain is worsening and does  make her feel short of breath.  She does still have some episodes of right-sided chest wall pain.  She does report recent hematochezia for over a month.  Past Medical History:  Diagnosis Date  . Anemia   . Anginal pain ([place])   . Anxiety   . Arthritis    "left hand" (04/26/2016)  . Asthma   . Chronic lower back pain   . Diverticulitis of colon   . GERD (gastroesophageal reflux disease)   . Headache    'once or twice/week" (04/26/2016)  . Migraine    "twice/month" (04/26/2016)  . Stomach ulcer     Patient Active Problem List   Diagnosis Date Noted  . Schizoaffective disorder, bipolar type ([place]) 10/10/2017  . Hyperprolactinemia ([place]) 08/16/2017  . ADD (attention deficit disorder) 08/14/2017  . Alcohol abuse 03/13/2017  . PTSD (post-traumatic stress disorder)  01/16/2017  . Panic disorder 01/16/2017  . Acute diverticulitis 04/26/2016  . Diverticulitis of large intestine with abscess without bleeding 04/26/2016  . Intractable migraine 07/27/2014  . Insomnia 05/26/2013    Past Surgical History:  Procedure Laterality Date  . ABDOMINAL HYSTERECTOMY  2013   preformed at [facility] by Dr Jacqualyn Posey in April  . BREAST SURGERY Bilateral ~ 2000   "dual lateral duct removal"   . BUNIONECTOMY Right 2013  . CESAREAN SECTION  1999  . OOPHORECTOMY Right 2013  . TONSILLECTOMY       OB History   None      Home Medications    Prior to Admission medications   Medication Sig Start Date End Date Taking? Authorizing Provider  acetaminophen (TYLENOL) 325 MG tablet Take 2 tablets (650 mg total) by mouth every 6 (six) hours as needed for mild pain. 10/14/17   Lindell Spar I, NP  albuterol (PROVENTIL HFA;VENTOLIN HFA) 108 (90 Base) MCG/ACT inhaler Inhale 2 puffs into the lungs every 6 (six) hours as needed for wheezing or shortness of breath. 10/14/17   Lindell Spar I, NP  ALPRAZolam Duanne Moron) 0.5 MG tablet Take 1 tablet (0.5 mg total) by mouth 2 (two) times daily as needed for anxiety. 07/19/18   Norman Clay, MD  cariprazine (VRAYLAR) capsule Take 1 capsule (3 mg total) by mouth daily. 06/27/18   Norman Clay, MD  Multiple Vitamin (MULTIVITAMIN WITH MINERALS) TABS tablet Take 1 tablet by mouth daily.  Vitamin supplement 10/14/17   Lindell Spar I, NP  naphazoline-glycerin (CLEAR EYES) 0.012-0.2 % SOLN Place 1-2 drops into both eyes 4 (four) times daily as needed for eye irritation. 10/14/17   Lindell Spar I, NP  nicotine (NICODERM CQ - DOSED IN MG/24 HOURS) 21 mg/24hr patch Place 1 patch (21 mg total) onto the skin daily at 6 (six) AM. (May purchase from over the counter at the pharmacy): For smoking cessation 10/14/17   Lindell Spar I, NP  prazosin (MINIPRESS) 2 MG capsule Take 3 capsules (6 mg total) by mouth at bedtime. 06/08/18   Norman Clay, MD  sertraline  (ZOLOFT) 100 MG tablet Take 2 tablets (200 mg total) by mouth daily. For depression 06/27/18   Norman Clay, MD    Family History Family History  Problem Relation Age of Onset  . Heart failure Father   . Bipolar disorder Father   . Schizophrenia Father   . Diabetes Other   . Breast cancer Other   . Diverticulitis Other   . Mental illness Paternal Grandmother   . Mental illness Paternal Grandfather   . Schizophrenia Paternal Aunt   . Suicidality Maternal Aunt     Social History Social History   Tobacco Use  . Smoking status: Former Smoker    Packs/day: 2.00    Years: 30.00    Pack years: 60.00    Types: Cigarettes  . Smokeless tobacco: Former Systems developer    Types: Chew  . Tobacco comment: "quit smoking cigarettes in ~ 2011; chewed when I was little"  Substance Use Topics  . Alcohol use: No  . Drug use: No     Allergies   Lamictal [lamotrigine]; Buprenorphine hcl; Ciprofloxacin; and Propranolol   Review of Systems Review of Systems  Constitutional: Negative for fever.  Respiratory: Positive for shortness of breath.   Gastrointestinal: Positive for abdominal pain, blood in stool and hematochezia. Negative for diarrhea and vomiting.  Genitourinary: Negative for dysuria.  All other systems reviewed and are negative.    Physical Exam Updated Vital Signs BP (!) 142/91 (BP Location: Left Arm)   Pulse 88   Temp 98.6 F (37 C) (Oral)   Resp 18   Ht 1.6 m (5\' 3" )   Wt 104.3 kg (230 lb)   SpO2 98%   BMI 40.74 kg/m   Physical Exam  CONSTITUTIONAL: Well developed/well nourished, anxious and hyperventilating HEAD: Normocephalic/atraumatic EYES: EOMI/PERRL, no icterus ENMT: Mucous membranes moist NECK: supple no meningeal signs SPINE/BACK:entire spine nontender CV: S1/S2 noted, no murmurs/rubs/gallops noted LUNGS: Lungs are clear to auscultation bilaterally, no apparent distress Chest-diffuse right-sided chest wall tenderness, no bruising or crepitus ABDOMEN: soft,  moderate right upper quadrant tenderness, no rebound or guarding, bowel sounds noted throughout abdomen GU:no cva tenderness Rectal-no blood or melena and no rectal masses, nurse Katie present for entire exam NEURO: Pt is awake/alert/appropriate, moves all extremitiesx4.  No facial droop.   EXTREMITIES: pulses normal/equal, full ROM SKIN: warm, color normal, no rash or bruising to chest wall or abdominal wall PSYCH: Anxious  ED Treatments / Results  Labs (all labs ordered are listed, but only abnormal results are displayed) Labs Reviewed  LIPASE, BLOOD - Abnormal; Notable for the following components:      Result Value   Lipase 54 (*)    All other components within normal limits  COMPREHENSIVE METABOLIC PANEL - Abnormal; Notable for the following components:   Glucose, Bld 109 (*)    All other components within normal limits  CBC  URINALYSIS, ROUTINE W REFLEX MICROSCOPIC  I-STAT BETA HCG BLOOD, ED ([facility], [facility], AP ONLY)  POC OCCULT BLOOD, ED    EKG EKG Interpretation  Date/Time:  Thursday June 29 2018 01:58:40 EDT Ventricular Rate:  78 PR Interval:    QRS Duration: 93 QT Interval:  397 QTC Calculation: 453 R Axis:   20 Text Interpretation:  Sinus rhythm Confirmed by Ripley Fraise [phone]) on 06/29/2018 3:31:01 AM   Radiology Dg Chest 2 View  Result Date: 06/29/2018 CLINICAL DATA:  Chest pain.  Vomiting for a couple of hours EXAM: CHEST - 2 VIEW COMPARISON:  10/09/2017 FINDINGS: Borderline heart size on the AP view but normal appearing laterally. There is no edema, consolidation, effusion, or pneumothorax. Normal heart size and aortic contours. IMPRESSION: No evidence of active disease. Electronically Signed   By: Monte Fantasia M.D.   On: 06/29/2018 01:28   US Abdomen Limited Ruq  Result Date: 06/29/2018 CLINICAL DATA:  Right upper quadrant pain for 2 months EXAM: ULTRASOUND ABDOMEN LIMITED RIGHT UPPER QUADRANT COMPARISON:  04/26/2016 abdominal CT FINDINGS: Gallbladder:  Dependent shadowing gallstones, including stone fixed at the neck measuring 2 cm. Focal tenderness per sonographer exam, without gallbladder wall thickening or edema. Common bile duct: Diameter: 3 mm.  Where visualized, no filling defect. Liver: No focal lesion identified. Within normal limits in parenchymal echogenicity. Portal vein is patent on color Doppler imaging with normal direction of blood flow towards the liver. IMPRESSION: Cholelithiasis including nonmobile stone in the gallbladder neck. Focal gallbladder tenderness may reflect gallbladder obstruction, but no wall inflammatory changes typical of acute cholecystitis. Electronically Signed   By: Monte Fantasia M.D.   On: 06/29/2018 01:50    Procedures Procedures    Medications Ordered in ED Medications  fentaNYL (SUBLIMAZE) injection 50 mcg (50 mcg Nasal Given 06/29/18 0239)  fentaNYL (SUBLIMAZE) injection 100 mcg (100 mcg Intravenous Given 06/29/18 0102)  ondansetron (ZOFRAN) injection 4 mg (4 mg Intravenous Given 06/29/18 0059)  sodium chloride 0.9 % bolus 1,000 mL (1,000 mLs Intravenous New Bag/Given 06/29/18 0059)  fentaNYL (SUBLIMAZE) injection 100 mcg (100 mcg Intravenous Given 06/29/18 0402)     Initial Impression / Assessment and Plan / ED Course  I have reviewed the triage vital signs and the nursing notes.  Pertinent labs & imaging results that were available during my care of the patient were reviewed by me and considered in my medical decision making (see chart for details).     1:12 AM She reports episodes of right chest and right upper quadrant abdominal pain for months.  Is been getting worse, and she reports she feels like she is "having a baby" she is anxious and hyperventilating. Exam is limited due to obesity, but appears to have both  chest wall as well as abdominal region.  Will obtain chest x-ray/EKG as well as right upper quadrant abdominal ultrasound 4:28 AM Ultrasound imaging reviewed.  She has signs of  cholelithiasis with stone in the neck of the gallbladder.  She has continued pain.  I discussed the case with Dr. Dema Severin on call for general surgery and he will evaluate the patient.  She will likely be admitted. Final Clinical Impressions(s) / ED Diagnoses   Final diagnoses:  Pain  Calculus of gallbladder without cholecystitis without obstruction    ED Discharge Orders    None       Ripley Fraise, MD 06/29/18 [phone]

## 2018-06-29 NOTE — Anesthesia Preprocedure Evaluation (Addendum)
Anesthesia Evaluation  Patient identified by MRN, date of birth, ID band Patient awake    Reviewed: Allergy & Precautions, NPO status , Patient's Chart, lab work & pertinent test results  Airway Mallampati: III  TM Distance: >3 FB Neck ROM: Full    Dental  (+) Dental Advisory Given   Pulmonary asthma , former smoker,    breath sounds clear to auscultation       Cardiovascular negative cardio ROS   Rhythm:Regular Rate:Normal     Neuro/Psych  Headaches, Anxiety Bipolar Disorder Schizophrenia    GI/Hepatic Neg liver ROS, PUD, GERD  Medicated,  Endo/Other  Morbid obesity  Renal/GU negative Renal ROS     Musculoskeletal  (+) Arthritis ,   Abdominal   Peds  Hematology negative hematology ROS (+)   Anesthesia Other Findings   Reproductive/Obstetrics                           Lab Results  Component Value Date   WBC 8.0 06/28/2018   HGB 14.1 06/28/2018   HCT 43.8 06/28/2018   MCV 94.8 06/28/2018   PLT 305 06/28/2018   Lab Results  Component Value Date   CREATININE 0.99 06/28/2018   BUN 15 06/28/2018   NA 140 06/28/2018   K 4.0 06/28/2018   CL 107 06/28/2018   CO2 23 06/28/2018    Anesthesia Physical Anesthesia Plan  ASA: III  Anesthesia Plan: General   Post-op Pain Management:    Induction: Intravenous  PONV Risk Score and Plan: 3 and Dexamethasone, Ondansetron, Midazolam and Treatment may vary due to age or medical condition  Airway Management Planned: Oral ETT  Additional Equipment:   Intra-op Plan:   Post-operative Plan: Extubation in OR  Informed Consent: I have reviewed the patients History and Physical, chart, labs and discussed the procedure including the risks, benefits and alternatives for the proposed anesthesia with the patient or authorized representative who has indicated his/her understanding and acceptance.   Dental advisory given  Plan Discussed with:  CRNA  Anesthesia Plan Comments:        Anesthesia Quick Evaluation

## 2018-06-29 NOTE — ED Notes (Signed)
Report given to OR/short stay

## 2018-06-29 NOTE — Anesthesia Postprocedure Evaluation (Signed)
Anesthesia Post Note  Patient: Deborah Pena  Procedure(s) Performed: LAPAROSCOPIC CHOLECYSTECTOMY WITH INTRAOPERATIVE CHOLANGIOGRAM (N/A Abdomen)     Patient location during evaluation: PACU Anesthesia Type: General Level of consciousness: awake and alert Pain management: pain level controlled Vital Signs Assessment: post-procedure vital signs reviewed and stable Respiratory status: spontaneous breathing, nonlabored ventilation, respiratory function stable and patient connected to nasal cannula oxygen Cardiovascular status: blood pressure returned to baseline and stable Postop Assessment: no apparent nausea or vomiting Anesthetic complications: no    Last Vitals:  Vitals:   06/29/18 1110 06/29/18 1135  BP: 126/65 129/64  Pulse: 74 71  Resp: 14 20  Temp: 36.4 C 36.6 C  SpO2: 98% 97%    Last Pain:  Vitals:   06/29/18 1135  TempSrc: Oral  PainSc:                  Deborah Pena

## 2018-06-29 NOTE — ED Notes (Signed)
ED Provider at bedside. 

## 2018-06-29 NOTE — Op Note (Signed)
06/29/2018  10:20 AM  PATIENT:  Deborah Pena, 52 y.o., female, MRN: [id number]  PREOP DIAGNOSIS:  Cholecystitis  POSTOP DIAGNOSIS:   Cholecystitis, cholelithiasis  PROCEDURE:   Procedure(s): LAPAROSCOPIC CHOLECYSTECTOMY WITH INTRAOPERATIVE CHOLANGIOGRAM  SURGEON:   Alphonsa Overall, M.D.  ASSISTANT:   Melina Modena, P.A.  ANESTHESIA:   general  Anesthesiologist: Suzette Battiest, MD CRNA: Leonor Liv, CRNA  General  ASA: 3E  EBL:  minimal  ml  BLOOD ADMINISTERED: none  DRAINS: none   LOCAL MEDICATIONS USED:   30 cc of 1/4 % marcaine  SPECIMEN:   Gall bladder  COUNTS CORRECT:  YES  INDICATIONS FOR PROCEDURE:  Deborah Pena is a 52 y.o. (DOB: [birth year]) white female whose primary care physician is [facility] and comes for cholecystectomy.   The indications and risks of the gall bladder surgery were explained to the patient.  The risks include, but are not limited to, infection, bleeding, common bile duct injury and open surgery.  SURGERY:  The patient was taken to [facility] OR room #8 at [facility].  The abdomen was prepped with chloroprep.  The patient was given Zosyn prior to the beginning of the operation.   A time out was held and the surgical checklist run.   An infraumbilical incision was made into the abdominal cavity.  A 12 mm Hasson trocar was inserted into the abdominal cavity through the infraumbilical incision and secured with a 0 Vicryl suture.  Three additional trocars were inserted: a 10 mm trocar in the sub-xiphoid location, a 5 mm trocar in the right mid subcostal area, and a 5 mm trocar in the right lateral subcostal area.   The abdomen was explored and the liver, stomach, and bowel that could be seen were unremarkable.   The gall bladder was mildly inflamed and had some adhesions of omentum to the lower gall bladder.   I grasped the gall bladder and rotated it cephalad.  Disssection was carried down to the gall bladder/cystic duct junction and  the cystic duct isolated.  A clip was placed on the gall bladder side of the cystic duct.   An intra-operative cholangiogram was shot.   The intra-operative cholangiogram was shot using a cut off Taut catheter placed through a 14 gauge angiocath in the RUQ.  The Taut catheter was inserted in the cut cystic duct and secured with an endoclip.  A cholangiogram was shot with 8 cc of 1/2 strength Isoview.  Using fluoroscopy, the cholangiogram showed the flow of contrast into the common bile duct, up the hepatic radicals, and into the duodenum.  She did appear to have an accessory right hepatic duct that was small. There was no mass or obstruction.  This was a normal intra-operative cholangiogram.   The Taut catheter was removed.  The cystic duct was tripley endoclipped and the cystic artery was identified and clipped.  The gall bladder was bluntly and sharpley dissected from the gall bladder bed.   After the gall bladder was removed from the liver, the gall bladder bed and [place] were inspected.  There was no bleeding or bile leak.  The gall bladder was placed in a endocatch bag and delivered through the umbilicus.  The abdomen was irrigated with 800 cc saline.   The trocars were then removed.  I infiltrated 30cc of 1/4% Marcaine into the incisions.  The umbilical port closed with a 0 Vicryl suture and the skin closed with 4-0 Monocryl.  The skin was painted with DermaBond.  The patient's sponge and needle count were correct.  The patient was transported to the RR in good condition.  Alphonsa Overall, MD, [facility] Surgery Pager: [phone] Office phone:  [phone]

## 2018-06-29 NOTE — Anesthesia Procedure Notes (Signed)
Procedure Name: Intubation Date/Time: 06/29/2018 8:53 AM Performed by: Leonor Liv, CRNA Pre-anesthesia Checklist: Patient identified, Emergency Drugs available, Suction available and Patient being monitored Patient Re-evaluated:Patient Re-evaluated prior to induction Oxygen Delivery Method: Circle system utilized Preoxygenation: Pre-oxygenation with 100% oxygen Induction Type: IV induction Ventilation: Oral airway inserted - appropriate to patient size Laryngoscope Size: Glidescope and 3 Grade View: Grade I Tube type: Oral Tube size: 6.5 mm Number of attempts: 3 Airway Equipment and Method: Video-laryngoscopy and Rigid stylet Placement Confirmation: ETT inserted through vocal cords under direct vision,  positive ETCO2 and breath sounds checked- equal and bilateral Secured at: 22 cm Tube secured with: Tape Dental Injury: Teeth and Oropharynx as per pre-operative assessment  Difficulty Due To: Difficulty was anticipated and Difficult Airway- due to large tongue Comments: DL x1 with MAC 4 - Grade 3 view with cricoid pressure, unable to pass ETT, mask ventilation resumed, DL x 1 with Miller 2, Grade 3 view, attempted to pass bougie stylet, unable to feel tracheal rings, mask ventilation resumed, Glidescope with 3 blade, Grade 1 view, passed ETT

## 2018-06-29 NOTE — Discharge Instructions (Signed)

## 2018-06-29 NOTE — ED Notes (Signed)
Patient transported to X-ray 

## 2018-06-30 ENCOUNTER — Encounter (HOSPITAL_COMMUNITY): Payer: Self-pay | Admitting: Surgery

## 2018-06-30 LAB — HIV ANTIBODY (ROUTINE TESTING W REFLEX): HIV Screen 4th Generation wRfx: NONREACTIVE

## 2018-06-30 MED ORDER — OXYCODONE HCL 5 MG PO TABS: 5.0000 mg | ORAL_TABLET | Freq: Four times a day (QID) | ORAL | 0 refills | Status: DC | PRN

## 2018-06-30 NOTE — Discharge Summary (Addendum)
[facility] Discharge Summary   Patient ID: Deborah Pena MRN: [id number] DOB/AGE: [birth year] 52 y.o.  Admit date: 06/28/2018 Discharge date: 06/30/2018  Admitting Diagnosis: Acute cholecystitis  Discharge Diagnosis Patient Active Problem List   Diagnosis Date Noted  . Acute cholecystitis 06/29/2018  . Cholecystitis 06/29/2018  . Schizoaffective disorder, bipolar type ([place]) 10/10/2017  . Hyperprolactinemia ([place]) 08/16/2017  . ADD (attention deficit disorder) 08/14/2017  . Alcohol abuse 03/13/2017  . PTSD (post-traumatic stress disorder) 01/16/2017  . Panic disorder 01/16/2017  . Acute diverticulitis 04/26/2016  . Diverticulitis of large intestine with abscess without bleeding 04/26/2016  . Intractable migraine 07/27/2014  . Insomnia 05/26/2013    Consultants None  Imaging: Dg Chest 2 View  Result Date: 06/29/2018 CLINICAL DATA:  Chest pain.  Vomiting for a couple of hours EXAM: CHEST - 2 VIEW COMPARISON:  10/09/2017 FINDINGS: Borderline heart size on the AP view but normal appearing laterally. There is no edema, consolidation, effusion, or pneumothorax. Normal heart size and aortic contours. IMPRESSION: No evidence of active disease. Electronically Signed   By: Monte Fantasia M.D.   On: 06/29/2018 01:28   Dg Cholangiogram Operative  Result Date: 06/29/2018 CLINICAL DATA:  Intraoperative cholangiogram during laparoscopic cholecystectomy. EXAM: INTRAOPERATIVE CHOLANGIOGRAM FLUOROSCOPY TIME:  19 seconds COMPARISON:  Right upper quadrant abdominal ultrasound-  06/29/2018 FINDINGS: Intraoperative cholangiographic images of the right upper abdominal quadrant during laparoscopic cholecystectomy are provided for review. Surgical clips overlie the expected location of the gallbladder fossa. Contrast injection demonstrates selective cannulation of the central aspect of the cystic duct. There is passage of contrast through the central aspect of the cystic duct with filling  of a non dilated common bile duct. There is passage of contrast though the CBD and into the descending portion of the duodenum. Note is made of a potential conjoined origin of the cystic duct and the posterior division of the right intrahepatic biliary system. There is minimal reflux of injected contrast into the common hepatic duct and central aspect of the non dilated intrahepatic biliary system. There is a transient nonocclusive filling defect within the common bile duct favored to represent an air bubble. There are no discrete persistent filling defects within the opacified portions of the biliary system to suggest the presence of choledocholithiasis. IMPRESSION: 1. No evidence of choledocholithiasis. 2. 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 Electronically Signed   By: Sandi Mariscal M.D.   On: 06/29/2018 10:20   US Abdomen Limited Ruq  Result Date: 06/29/2018 CLINICAL DATA:  Right upper quadrant pain for 2 months EXAM: ULTRASOUND ABDOMEN LIMITED RIGHT UPPER QUADRANT COMPARISON:  04/26/2016 abdominal CT FINDINGS: Gallbladder: Dependent shadowing gallstones, including stone fixed at the neck measuring 2 cm. Focal tenderness per sonographer exam, without gallbladder wall thickening or edema. Common bile duct: Diameter: 3 mm.  Where visualized, no filling defect. Liver: No focal lesion identified. Within normal limits in parenchymal echogenicity. Portal vein is patent on color Doppler imaging with normal direction of blood flow towards the liver. IMPRESSION: Cholelithiasis including nonmobile stone in the gallbladder neck. Focal gallbladder tenderness may reflect gallbladder obstruction, but no wall inflammatory changes typical of acute cholecystitis. Electronically Signed   By: Monte Fantasia M.D.   On: 06/29/2018 01:50    Procedures Dr. Lucia Gaskins (06/29/18) - Laparoscopic cholecystectomy  Hospital Course:  Deborah Pena is a 52yo female  who presented to [facility] 7/10 with 7 days of severe RUQ pain.  Workup showed acute cholecystitis.  Patient was admitted and underwent procedure listed above.  Tolerated procedure well and was transferred to the floor.  Diet was advanced as tolerated.  On POD1, the patient was voiding well, tolerating diet, ambulating well, pain well controlled, vital signs stable, incisions c/d/i and felt stable for discharge home.  Patient will follow up as below and knows to call with questions or concerns.    I have personally reviewed the patients medication history on the [place] controlled substance database.    Physical Exam: General:  Alert, NAD, comfortable Pulm: effort normal Abd:  Soft, ND, appropriately tender, multiple lap incisions C/D/I  Allergies as of 06/30/2018      Reactions   Lamictal [lamotrigine] Shortness Of Breath, Rash, Other (See Comments)   Swollen lymph nodes   Lithium Anaphylaxis   Buprenorphine Hcl Other (See Comments)   "Becomes psychotic"--per notes from another healthcare network   Ciprofloxacin Itching, Other (See Comments)   Sore throat, hoarseness   Propranolol Other (See Comments)   Syncope      Medication List    STOP taking these medications   ALPRAZolam 0.5 MG tablet Commonly known as:  XANAX   naphazoline-glycerin 0.012-0.2 % Soln Commonly known as:  CLEAR EYES REDNESS   prazosin 2 MG capsule Commonly known as:  MINIPRESS     TAKE these medications   acetaminophen 325 MG tablet Commonly known as:  TYLENOL Take 2 tablets (650 mg total) by mouth every 6 (six) hours as needed for mild pain.   albuterol 108 (90 Base) MCG/ACT inhaler Commonly known as:  PROVENTIL HFA;VENTOLIN HFA Inhale 2 puffs into the lungs every 6 (six) hours as needed for wheezing or shortness of breath.   cariprazine capsule Commonly known as:  VRAYLAR Take 1 capsule (3 mg total) by mouth daily.   multivitamin with minerals Tabs tablet Take 1 tablet by mouth daily. Vitamin supplement    nicotine 21 mg/24hr patch Commonly known as:  NICODERM CQ - dosed in mg/24 hours Place 1 patch (21 mg total) onto the skin daily at 6 (six) AM. (May purchase from over the counter at the pharmacy): For smoking cessation   oxyCODONE 5 MG immediate release tablet Commonly known as:  Oxy IR/ROXICODONE Take 1 tablet (5 mg total) by mouth every 6 (six) hours as needed for severe pain.   sertraline 100 MG tablet Commonly known as:  ZOLOFT Take 2 tablets (200 mg total) by mouth daily. For depression        Follow-up [facility], [place]. Go on 07/13/2018.   Specialty:  General Surgery Why:  Your appointment is 07/25 at 3 pm Please arrive 30 minutes prior to your appointment to check in and fill out paperwork. Bring photo ID and insurance information. Contact information: [address] [place] [phone]          Signed: Wellington Hampshire, [facility] 06/30/2018, 8:47 AM Pager: [phone] Consults: [phone]  Agree with above.

## 2018-06-30 NOTE — Progress Notes (Signed)
Patient given discharge instructions and prescription. Patient verbalized understanding and left unit in stable condition.

## 2018-07-21 ENCOUNTER — Other Ambulatory Visit (HOSPITAL_COMMUNITY): Payer: Self-pay | Admitting: Psychiatry

## 2018-07-21 ENCOUNTER — Telehealth (HOSPITAL_COMMUNITY): Payer: Self-pay | Admitting: *Deleted

## 2018-07-21 MED ORDER — ESZOPICLONE 1 MG PO TABS: 1.0000 mg | ORAL_TABLET | Freq: Every evening | ORAL | 0 refills | Status: DC | PRN

## 2018-07-21 NOTE — Telephone Encounter (Signed)
ordered

## 2018-07-21 NOTE — Telephone Encounter (Signed)
Dr Modesta Messing Patient called & stated that she hasn't been sleeping well since d/c'd from the Ambien.  Request to go back on the Lunesta so she can get some sleep.

## 2018-07-24 ENCOUNTER — Ambulatory Visit (HOSPITAL_COMMUNITY): Payer: Self-pay | Admitting: Psychiatry

## 2018-07-31 ENCOUNTER — Telehealth (HOSPITAL_COMMUNITY): Payer: Self-pay | Admitting: *Deleted

## 2018-07-31 ENCOUNTER — Other Ambulatory Visit (HOSPITAL_COMMUNITY): Payer: Self-pay | Admitting: Psychiatry

## 2018-07-31 MED ORDER — SERTRALINE HCL 100 MG PO TABS: 200.0000 mg | ORAL_TABLET | Freq: Every day | ORAL | 0 refills | Status: DC

## 2018-07-31 NOTE — Telephone Encounter (Signed)
sent 

## 2018-07-31 NOTE — Telephone Encounter (Signed)
Dr Harrington Challenger Dr Modesta Messing patient next appointment is 08/31/18 requesting refill on Zoloft

## 2018-08-15 ENCOUNTER — Telehealth (HOSPITAL_COMMUNITY): Payer: Self-pay | Admitting: *Deleted

## 2018-08-15 ENCOUNTER — Other Ambulatory Visit (HOSPITAL_COMMUNITY): Payer: Self-pay | Admitting: Psychiatry

## 2018-08-15 MED ORDER — ALPRAZOLAM 0.5 MG PO TABS: 0.5000 mg | ORAL_TABLET | Freq: Two times a day (BID) | ORAL | 0 refills | Status: DC | PRN

## 2018-08-15 NOTE — Telephone Encounter (Signed)
ordered

## 2018-08-15 NOTE — Telephone Encounter (Signed)
Dr Modesta Messing Patient called requesting refill on Alprazolam. Stated she picked up last month 07/18/18 & she's almost out now.

## 2018-08-22 ENCOUNTER — Other Ambulatory Visit (HOSPITAL_COMMUNITY): Payer: Self-pay | Admitting: Psychiatry

## 2018-08-22 MED ORDER — SERTRALINE HCL 100 MG PO TABS: 200.0000 mg | ORAL_TABLET | Freq: Every day | ORAL | 0 refills | Status: DC

## 2018-08-23 ENCOUNTER — Other Ambulatory Visit (HOSPITAL_COMMUNITY): Payer: Self-pay | Admitting: Psychiatry

## 2018-08-23 ENCOUNTER — Telehealth (HOSPITAL_COMMUNITY): Payer: Self-pay

## 2018-08-23 MED ORDER — ESZOPICLONE 1 MG PO TABS: 1.0000 mg | ORAL_TABLET | Freq: Every evening | ORAL | 0 refills | Status: DC | PRN

## 2018-08-23 NOTE — Telephone Encounter (Signed)
Patient needs a refill on Lunesta 1mg  tabs. Please send refill to [facility] next appointment is scheduled for 08-31-18

## 2018-08-23 NOTE — Telephone Encounter (Signed)
Called and left voicemail saying that med had been sent to the pharmacy

## 2018-08-23 NOTE — Telephone Encounter (Signed)
ordered

## 2018-08-28 ENCOUNTER — Other Ambulatory Visit (HOSPITAL_COMMUNITY): Payer: Self-pay | Admitting: Psychiatry

## 2018-08-28 ENCOUNTER — Telehealth (HOSPITAL_COMMUNITY): Payer: Self-pay | Admitting: *Deleted

## 2018-08-28 MED ORDER — CARIPRAZINE HCL 3 MG PO CAPS: 3.0000 mg | ORAL_CAPSULE | Freq: Every day | ORAL | 0 refills | Status: DC

## 2018-08-28 NOTE — Telephone Encounter (Signed)
Dr Modesta Messing [name]. Net visit is 08/31/18 & she stated she's out of medication.

## 2018-08-28 NOTE — Telephone Encounter (Signed)
ordered
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
## 2018-08-31 ENCOUNTER — Ambulatory Visit (HOSPITAL_COMMUNITY): Payer: Self-pay | Admitting: Psychiatry

## 2018-09-11 ENCOUNTER — Other Ambulatory Visit (HOSPITAL_COMMUNITY): Payer: Self-pay | Admitting: Psychiatry

## 2018-09-11 MED ORDER — ALPRAZOLAM 0.5 MG PO TABS: 0.5000 mg | ORAL_TABLET | Freq: Two times a day (BID) | ORAL | 0 refills | Status: DC | PRN
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
## 2018-09-20 ENCOUNTER — Telehealth (HOSPITAL_COMMUNITY): Payer: Self-pay | Admitting: *Deleted

## 2018-09-20 NOTE — Telephone Encounter (Signed)
Calling to check appointment time

## 2018-09-21 ENCOUNTER — Ambulatory Visit (INDEPENDENT_AMBULATORY_CARE_PROVIDER_SITE_OTHER): Payer: No Typology Code available for payment source | Admitting: Psychiatry

## 2018-09-21 VITALS — BP 158/78 | HR 105 | Ht 60.0 in | Wt 255.0 lb

## 2018-09-21 DIAGNOSIS — G4700 Insomnia, unspecified: Secondary | ICD-10-CM

## 2018-09-21 DIAGNOSIS — F4310 Post-traumatic stress disorder, unspecified: Secondary | ICD-10-CM | POA: Diagnosis not present

## 2018-09-21 DIAGNOSIS — F419 Anxiety disorder, unspecified: Secondary | ICD-10-CM

## 2018-09-21 DIAGNOSIS — F250 Schizoaffective disorder, bipolar type: Secondary | ICD-10-CM

## 2018-09-21 DIAGNOSIS — Z87891 Personal history of nicotine dependence: Secondary | ICD-10-CM

## 2018-09-21 MED ORDER — BREXPIPRAZOLE 2 MG PO TABS: 2.0000 mg | ORAL_TABLET | Freq: Every day | ORAL | 0 refills | Status: DC

## 2018-09-21 MED ORDER — ESZOPICLONE 1 MG PO TABS: 1.0000 mg | ORAL_TABLET | Freq: Every evening | ORAL | 0 refills | Status: DC | PRN

## 2018-09-21 MED ORDER — SERTRALINE HCL 100 MG PO TABS: 200.0000 mg | ORAL_TABLET | Freq: Every day | ORAL | 0 refills | Status: DC

## 2018-09-21 MED ORDER — ALPRAZOLAM 0.5 MG PO TABS: 0.5000 mg | ORAL_TABLET | Freq: Two times a day (BID) | ORAL | 0 refills | Status: DC | PRN

## 2018-09-21 NOTE — Patient Instructions (Signed)
1. Start rexulti 1 mg daily for one week, then 2 mg daily 2. Discontinue Vraylar 3. Continuesertraline200mg  daily 4. Continue Lunesta 1 mg at night   5. ContinueXanax 0.5  mg twice a day as needed for anxiety 6. Return to clinic in one month for 30 mins

## 2018-09-26 ENCOUNTER — Other Ambulatory Visit (HOSPITAL_COMMUNITY): Payer: Self-pay | Admitting: Psychiatry

## 2018-09-26 ENCOUNTER — Telehealth (HOSPITAL_COMMUNITY): Payer: Self-pay | Admitting: *Deleted

## 2018-09-26 MED ORDER — CARIPRAZINE HCL 3 MG PO CAPS: 3.0000 mg | ORAL_CAPSULE | Freq: Every day | ORAL | 0 refills | Status: DC

## 2018-09-26 NOTE — Telephone Encounter (Signed)
Discussed with the patient. She had rash after trying Rexulti for 3 days. It subsided after she discotinued Rexulti. She reinitiated vraylar samples.  - Ordered vraylar 3 mg daily  - She is advised to make follow up appointment - Will consider metformin or topiramate at the next visit

## 2018-09-26 NOTE — Telephone Encounter (Signed)
Dr Modesta Messing Patient called stating that the [place] med isn't working well & prefers to be back on the Vraylar  3 mg

## 2018-10-24 ENCOUNTER — Telehealth (HOSPITAL_COMMUNITY): Payer: Self-pay | Admitting: *Deleted

## 2018-10-24 ENCOUNTER — Other Ambulatory Visit (HOSPITAL_COMMUNITY): Payer: Self-pay | Admitting: Psychiatry

## 2018-10-24 MED ORDER — CARIPRAZINE HCL 3 MG PO CAPS: 3.0000 mg | ORAL_CAPSULE | Freq: Every day | ORAL | 0 refills | Status: DC

## 2018-10-24 MED ORDER — SERTRALINE HCL 100 MG PO TABS: 200.0000 mg | ORAL_TABLET | Freq: Every day | ORAL | 0 refills | Status: DC

## 2018-10-24 NOTE — Telephone Encounter (Signed)
Ordered. Please advise her to make follow up appointment

## 2018-10-24 NOTE — Telephone Encounter (Signed)
Dr Modesta Messing Patient called stating she need refill on Zoloft stated" she's been out of Zoloft for 5 days  & is going thru withdrawal "  & she also needs refill on Vraylar

## 2018-10-24 NOTE — Telephone Encounter (Signed)
LVM : Med's Ordered.  advise her to make follow up appointment

## 2018-11-06 ENCOUNTER — Other Ambulatory Visit (HOSPITAL_COMMUNITY): Payer: Self-pay | Admitting: Psychiatry

## 2018-11-06 ENCOUNTER — Telehealth (HOSPITAL_COMMUNITY): Payer: Self-pay | Admitting: *Deleted

## 2018-11-06 MED ORDER — ALPRAZOLAM 0.5 MG PO TABS: 0.5000 mg | ORAL_TABLET | Freq: Two times a day (BID) | ORAL | 0 refills | Status: DC | PRN

## 2018-11-06 NOTE — Telephone Encounter (Signed)
Dr Modesta Messing  Patient called requesting refill on Lunesta & Xanax. Next appointment  12/07/18

## 2018-11-06 NOTE — Telephone Encounter (Signed)
Ordered xanax only. Will not plan to order lunesta at this time as she received other sleep medication from other provider.

## 2018-11-21 ENCOUNTER — Other Ambulatory Visit: Payer: Self-pay

## 2018-11-21 ENCOUNTER — Emergency Department (HOSPITAL_COMMUNITY): Payer: No Typology Code available for payment source

## 2018-11-21 ENCOUNTER — Encounter (HOSPITAL_COMMUNITY): Payer: Self-pay | Admitting: Emergency Medicine

## 2018-11-21 ENCOUNTER — Emergency Department (HOSPITAL_COMMUNITY)
Admission: EM | Admit: 2018-11-21 | Discharge: 2018-11-21 | Disposition: A | Discharge status: Home / Self Care | Payer: No Typology Code available for payment source | Attending: Emergency Medicine | Admitting: Emergency Medicine

## 2018-11-21 DIAGNOSIS — Z79899 Other long term (current) drug therapy: Secondary | ICD-10-CM | POA: Insufficient documentation

## 2018-11-21 DIAGNOSIS — Z87891 Personal history of nicotine dependence: Secondary | ICD-10-CM | POA: Insufficient documentation

## 2018-11-21 DIAGNOSIS — R1011 Right upper quadrant pain: Secondary | ICD-10-CM | POA: Diagnosis present

## 2018-11-21 DIAGNOSIS — J45909 Unspecified asthma, uncomplicated: Secondary | ICD-10-CM | POA: Diagnosis not present

## 2018-11-21 LAB — COMPREHENSIVE METABOLIC PANEL
ALT: 38 U/L (ref 0–44)
ANION GAP: 11 (ref 5–15)
AST: 33 U/L (ref 15–41)
Albumin: 4 g/dL (ref 3.5–5.0)
Alkaline Phosphatase: 71 U/L (ref 38–126)
BILIRUBIN TOTAL: 0.5 mg/dL (ref 0.3–1.2)
BUN: 11 mg/dL (ref 6–20)
CO2: 23 mmol/L (ref 22–32)
Calcium: 9.7 mg/dL (ref 8.9–10.3)
Chloride: 104 mmol/L (ref 98–111)
Creatinine, Ser: 0.83 mg/dL (ref 0.44–1.00)
GFR calc Af Amer: >60 mL/min (ref >60)
Glucose, Bld: 147 mg/dL — ABNORMAL HIGH (ref 70–99)
POTASSIUM: 3.7 mmol/L (ref 3.5–5.1)
Sodium: 138 mmol/L (ref 135–145)
Total Protein: 7.1 g/dL (ref 6.5–8.1)

## 2018-11-21 LAB — CBC
HCT: 41 % (ref 36.0–46.0)
HEMOGLOBIN: 13.6 g/dL (ref 12.0–15.0)
MCH: 30.7 pg (ref 26.0–34.0)
MCHC: 33.2 g/dL (ref 30.0–36.0)
MCV: 92.6 fL (ref 80.0–100.0)
PLATELETS: 285 10*3/uL (ref 150–400)
RBC: 4.43 MIL/uL (ref 3.87–5.11)
RDW: 12 % (ref 11.5–15.5)
WBC: 6.9 10*3/uL (ref 4.0–10.5)
nRBC: 0 % (ref 0.0–0.2)

## 2018-11-21 LAB — URINALYSIS, ROUTINE W REFLEX MICROSCOPIC
BILIRUBIN URINE: NEGATIVE
Glucose, UA: NEGATIVE mg/dL
Hgb urine dipstick: NEGATIVE
Ketones, ur: NEGATIVE mg/dL
Leukocytes, UA: NEGATIVE
Nitrite: NEGATIVE
PROTEIN: NEGATIVE mg/dL
SPECIFIC GRAVITY, URINE: 1.019 (ref 1.005–1.030)
pH: 5 (ref 5.0–8.0)

## 2018-11-21 LAB — D-DIMER, QUANTITATIVE (NOT AT ARMC): D DIMER QUANT: 0.35 ug{FEU}/mL (ref 0.00–0.50)

## 2018-11-21 LAB — I-STAT TROPONIN, ED: Troponin i, poc: 0 ng/mL (ref 0.00–0.08)

## 2018-11-21 LAB — LIPASE, BLOOD: Lipase: 65 U/L — ABNORMAL HIGH (ref 11–51)

## 2018-11-21 MED ORDER — MORPHINE SULFATE (PF) 4 MG/ML IV SOLN
4.0000 mg | Freq: Once | INTRAVENOUS | Status: AC
  Administered 2018-11-21: 4 mg via INTRAVENOUS
  Filled 2018-11-21: qty 1

## 2018-11-21 MED ORDER — OXYCODONE-ACETAMINOPHEN 5-325 MG PO TABS
1.0000 | ORAL_TABLET | ORAL | Status: AC | PRN
  Administered 2018-11-21 (×2): 1 via ORAL
  Filled 2018-11-21 (×2): qty 1

## 2018-11-21 MED ORDER — OXYCODONE-ACETAMINOPHEN 5-325 MG PO TABS
1.0000 | ORAL_TABLET | ORAL | Status: DC | PRN
  Administered 2018-11-21: 1 via ORAL
  Filled 2018-11-21: qty 1

## 2018-11-21 MED ORDER — IOHEXOL 300 MG/ML  SOLN
100.0000 mL | Freq: Once | INTRAMUSCULAR | Status: AC
  Administered 2018-11-21: 100 mL via INTRAVENOUS

## 2018-11-21 MED ORDER — OXYCODONE HCL 5 MG PO TABS: 2.5000 mg | ORAL_TABLET | Freq: Four times a day (QID) | ORAL | 0 refills | Status: DC | PRN

## 2018-11-21 MED ORDER — ONDANSETRON 4 MG PO TBDP
4.0000 mg | ORAL_TABLET | Freq: Once | ORAL | Status: AC | PRN
  Administered 2018-11-21: 4 mg via ORAL
  Filled 2018-11-21: qty 1

## 2018-11-21 NOTE — ED Triage Notes (Signed)
Pt has hx of cholecystectomy and reports pain in same area under right rib cage that started 3 days ago and has since gotten worse tonight. Pt reports she feels like something is going to pop and reports pain with inspiration and coughing. Denies any injuries. Also endorses headache and nausea. Pain 10/10.

## 2018-11-21 NOTE — ED Notes (Signed)
Pt transported to CT ?

## 2018-11-21 NOTE — ED Provider Notes (Signed)
  Physical Exam  BP (!) 170/91 (BP Location: Right Arm)   Pulse 94   Temp 98.5 F (36.9 C)   Resp 16   Ht 5\' 3"  (1.6 m)   Wt 88.5 kg   SpO2 100%   BMI 34.54 kg/m   6:33 AM Patient taken in signout from [place] RUQ/RL thoracic pain worsenig over 3 days  at arrival tachypneic/ tachycardic with anxiety.  awaiting d-dimer.  +/- cta chest PE or CT abd/pelv w/  Physical Exam  Constitutional: She is oriented to person, place, and time. She appears well-developed and well-nourished. No distress.  HENT:  Head: Normocephalic and atraumatic.  Eyes: Conjunctivae are normal. No scleral icterus.  Neck: Normal range of motion.  Cardiovascular: Normal rate, regular rhythm and normal heart sounds. Exam reveals no gallop and no friction rub.  No murmur heard. Pulmonary/Chest: Effort normal and breath sounds normal. No respiratory distress. She exhibits tenderness.  Abdominal: Soft. Bowel sounds are normal. She exhibits no distension and no mass. There is tenderness. There is no guarding.  Tenderness in the right upper quadrant and epigastrium of the abdomen.  Also tender over the right lateral rib cage.  No rashes.  Neurological: She is alert and oriented to person, place, and time.  Skin: Skin is warm and dry. She is not diaphoretic.  Psychiatric: Her behavior is normal.  Nursing note and vitals reviewed.   ED Course/Procedures   Clinical Course as of Nov 21 1641  Tue Nov 21, 2018  0902 Patient CT scan negative for any abnormality.   [AH]    Clinical Course User Index [AH] Margarita Mail, PA-C    Procedures  MDM  Patient with pain in the right upper quadrant.  Pain over the right lateral chest wall.  Her imaging is negative for acute abnormality.  Question potential for choledocholithiasis although patient does not have any current evidence of elevated LFTs.  She states that she has had recurrent pain in this area since her cholecystectomy.  This was severe and worse and that is  what brought her to the emergency department.  Pain was difficult to control and I have reviewed her controlled substance list and PMP aware.  She does not have any current opiate prescriptions.  Patient will have a small amount of oxycodone at discharge for her significant pain.  She has had improvement here in the emergency department but still hurts badly.  Her vital signs have improved here and she appears appropriate for discharge.  She should follow-up with gi.       Margarita Mail, PA-C 11/21/18 1644    Tegeler, Gwenyth Allegra, MD 11/21/18 [id number]

## 2018-11-21 NOTE — ED Provider Notes (Signed)
[facility] EMERGENCY DEPARTMENT Provider Note   CSN: [id number] Arrival date & time: 11/21/18  0005     History   Chief Complaint Chief Complaint  Patient presents with  . Rib cage pain    HPI Armandina Iman is a 52 y.o. female with a past medical history of bipolar schizoaffective disorder, PTSD, alcohol use, repeat diverticulitis, status post cholecystectomy,  who presents today for evaluation of right-sided lower chest/upper abdominal pain.  She reports that this is been going on for the past 3 days and has gotten significantly worse tonight.  She tells me that it feels like there is a bag of cement under her ribs on the right side.  She has had pain over her incision site since the surgery but nothing like this.  She denies nausea or vomiting.  She has no diarrhea.  She reports shortness of breath when she is walking and talking.     HPI  Past Medical History:  Diagnosis Date  . Anemia   . Anginal pain ([place])   . Anxiety   . Arthritis    "left hand" (04/26/2016)  . Asthma   . Chronic lower back pain   . Diverticulitis of colon   . GERD (gastroesophageal reflux disease)   . Headache    'once or twice/week" (04/26/2016)  . Migraine    "twice/month" (04/26/2016)  . Stomach ulcer     Patient Active Problem List   Diagnosis Date Noted  . Acute cholecystitis 06/29/2018  . Cholecystitis 06/29/2018  . Schizoaffective disorder, bipolar type ([place]) 10/10/2017  . Hyperprolactinemia ([place]) 08/16/2017  . ADD (attention deficit disorder) 08/14/2017  . Alcohol abuse 03/13/2017  . PTSD (post-traumatic stress disorder) 01/16/2017  . Panic disorder 01/16/2017  . Acute diverticulitis 04/26/2016  . Diverticulitis of large intestine with abscess without bleeding 04/26/2016  . Intractable migraine 07/27/2014  . Insomnia 05/26/2013    Past Surgical History:  Procedure Laterality Date  . ABDOMINAL HYSTERECTOMY  2013   preformed at [facility] by Dr Jacqualyn Posey in April  .  BREAST SURGERY Bilateral ~ 2000   "dual lateral duct removal"   . BUNIONECTOMY Right 2013  . CESAREAN SECTION  1999  . CHOLECYSTECTOMY  06/29/2018  . CHOLECYSTECTOMY N/A 06/29/2018   Procedure: LAPAROSCOPIC CHOLECYSTECTOMY WITH INTRAOPERATIVE CHOLANGIOGRAM;  Surgeon: Alphonsa Overall, MD;  Location: [facility];  Service: General;  Laterality: N/A;  . OOPHORECTOMY Right 2013  . TONSILLECTOMY       OB History   None      Home Medications    Prior to Admission medications   Medication Sig Start Date End Date Taking? Authorizing Provider  albuterol (PROVENTIL HFA;VENTOLIN HFA) 108 (90 Base) MCG/ACT inhaler Inhale 2 puffs into the lungs every 6 (six) hours as needed for wheezing or shortness of breath. 10/14/17  Yes Lindell Spar I, NP  cariprazine (VRAYLAR) capsule Take 1 capsule (3 mg total) by mouth daily. 10/24/18  Yes Norman Clay, MD  Multiple Vitamin (MULTIVITAMIN WITH MINERALS) TABS tablet Take 1 tablet by mouth daily. Vitamin supplement 10/14/17  Yes Lindell Spar I, NP  sertraline (ZOLOFT) 100 MG tablet Take 2 tablets (200 mg total) by mouth daily. For depression 10/24/18  Yes Hisada, Elie Goody, MD  acetaminophen (TYLENOL) 325 MG tablet Take 2 tablets (650 mg total) by mouth every 6 (six) hours as needed for mild pain. Patient not taking: Reported on 11/21/2018 10/14/17   Lindell Spar I, NP  ALPRAZolam Duanne Moron) 0.5 MG tablet Take 1 tablet (0.5 mg  total) by mouth 2 (two) times daily as needed for anxiety. Patient not taking: Reported on 11/21/2018 11/11/18   Norman Clay, MD  eszopiclone (LUNESTA) 1 MG TABS tablet Take 1 tablet (1 mg total) by mouth at bedtime as needed for sleep. Take immediately before bedtime Patient not taking: Reported on 11/21/2018 09/21/18   Norman Clay, MD  nicotine (NICODERM CQ - DOSED IN MG/24 HOURS) 21 mg/24hr patch Place 1 patch (21 mg total) onto the skin daily at 6 (six) AM. (May purchase from over the counter at the pharmacy): For smoking cessation Patient not  taking: Reported on 11/21/2018 10/14/17   Lindell Spar I, NP  oxyCODONE (OXY IR/ROXICODONE) 5 MG immediate release tablet Take 1 tablet (5 mg total) by mouth every 6 (six) hours as needed for severe pain. Patient not taking: Reported on 11/21/2018 06/30/18   Wellington Hampshire, PA-C    Family History Family History  Problem Relation Age of Onset  . Heart failure Father   . Bipolar disorder Father   . Schizophrenia Father   . Diabetes Other   . Breast cancer Other   . Diverticulitis Other   . Mental illness Paternal Grandmother   . Mental illness Paternal Grandfather   . Schizophrenia Paternal Aunt   . Suicidality Maternal Aunt     Social History Social History   Tobacco Use  . Smoking status: Former Smoker    Packs/day: 2.00    Years: 30.00    Pack years: 60.00    Types: Cigarettes  . Smokeless tobacco: Former Systems developer    Types: Chew  . Tobacco comment: "quit smoking cigarettes in ~ 2011; chewed when I was little"  Substance Use Topics  . Alcohol use: No  . Drug use: No     Allergies   Lamictal [lamotrigine]; Lithium; Buprenorphine hcl; Ciprofloxacin; and Propranolol   Review of Systems Review of Systems  Constitutional: Negative for chills and fever.  HENT: Negative for congestion.   Respiratory: Positive for shortness of breath.   Cardiovascular: Positive for chest pain and leg swelling (Bilateral, slight, did not notice until was asked. ). Negative for palpitations.  Gastrointestinal: Positive for abdominal pain. Negative for diarrhea, nausea and vomiting.  Musculoskeletal: Negative for back pain and neck pain.  Skin: Negative for color change and rash.  Neurological: Negative for weakness, numbness and headaches.  Psychiatric/Behavioral: The patient is nervous/anxious.   All other systems reviewed and are negative.    Physical Exam Updated Vital Signs BP (!) 170/91 (BP Location: Right Arm)   Pulse 94   Temp 98.5 F (36.9 C)   Resp 16   Ht 5\' 3"  (1.6 m)   Wt  88.5 kg   SpO2 100%   BMI 34.54 kg/m   Physical Exam  Constitutional: She is oriented to person, place, and time. She appears well-developed and well-nourished. No distress.  HENT:  Head: Normocephalic and atraumatic.  Mouth/Throat: Oropharynx is clear and moist.  Eyes: Conjunctivae are normal.  Neck: Normal range of motion. Neck supple.  Cardiovascular: Normal rate, regular rhythm, normal heart sounds and intact distal pulses. Exam reveals no friction rub.  No murmur heard. Pulmonary/Chest: Effort normal and breath sounds normal. No stridor. No respiratory distress. She has no wheezes. She exhibits tenderness (Right anterior lower chest).  Abdominal: Soft. Bowel sounds are normal. She exhibits no distension. There is tenderness (Right upper quadrant and epigastric. ). There is no guarding.  Musculoskeletal: She exhibits no edema.  Neurological: She is alert and oriented  to person, place, and time.  Skin: Skin is warm and dry.  Psychiatric: Her behavior is normal. Her mood appears anxious.  Nursing note and vitals reviewed.    ED Treatments / Results  Labs (all labs ordered are listed, but only abnormal results are displayed) Labs Reviewed  LIPASE, BLOOD - Abnormal; Notable for the following components:      Result Value   Lipase 65 (*)    All other components within normal limits  COMPREHENSIVE METABOLIC PANEL - Abnormal; Notable for the following components:   Glucose, Bld 147 (*)    All other components within normal limits  URINALYSIS, ROUTINE W REFLEX MICROSCOPIC - Abnormal; Notable for the following components:   APPearance HAZY (*)    All other components within normal limits  CBC  D-DIMER, QUANTITATIVE (NOT AT [facility])  I-STAT TROPONIN, ED    EKG None  Radiology Dg Chest 2 View  Result Date: 11/21/2018 CLINICAL DATA:  52 year old female with shortness of breath. EXAM: CHEST - 2 VIEW COMPARISON:  Chest radiograph dated 06/29/2018 FINDINGS: The heart size and  mediastinal contours are within normal limits. Both lungs are clear. The visualized skeletal structures are unremarkable. IMPRESSION: No active cardiopulmonary disease. Electronically Signed   By: Anner Crete M.D.   On: 11/21/2018 00:41    Procedures Procedures (including critical care time)  Medications Ordered in ED Medications  oxyCODONE-acetaminophen (PERCOCET/ROXICET) 5-325 MG per tablet 1 tablet (1 tablet Oral Given 11/21/18 0027)  ondansetron (ZOFRAN-ODT) disintegrating tablet 4 mg (4 mg Oral Given 11/21/18 0027)  morphine 4 MG/ML injection 4 mg (4 mg Intravenous Given 11/21/18 0552)     Initial Impression / Assessment and Plan / ED Course  I have reviewed the triage vital signs and the nursing notes.  Pertinent labs & imaging results that were available during my care of the patient were reviewed by me and considered in my medical decision making (see chart for details).    Avila Albritton presents today for evaluation of right sided chest pain/RUQ abdominal pain for three days.  She reports shortness of breath, and cough.  On arrival she was tachycardic and tachypnic.  Labs show no anemia.  Her lipase is mildly elevated at 65.  She does not have a significant leukocytosis, no transaminitis.  Chest x-ray does not show any acute abnormalities.  Troponin is not elevated.  D-dimer obtained and pending.  Her pain was treated with percocet, morphine, and zofran in the department.    At shift change care was transferred to Doreen Salvage PA-C who will follow pending studies, re-evaulate and determine disposition.     Final Clinical Impressions(s) / ED Diagnoses   Final diagnoses:  Right upper quadrant pain    ED Discharge Orders    None       Ollen Gross 11/21/18 2956    Little, Wenda Overland, MD 11/21/18 [phone]

## 2018-11-21 NOTE — Discharge Instructions (Addendum)

## 2018-11-21 NOTE — ED Notes (Signed)
Patient verbalizes understanding of discharge instructions. Opportunity for questioning and answers were provided. Pt discharged from ED. 

## 2018-11-23 ENCOUNTER — Other Ambulatory Visit (HOSPITAL_COMMUNITY): Payer: Self-pay | Admitting: Psychiatry

## 2018-11-23 ENCOUNTER — Telehealth (HOSPITAL_COMMUNITY): Payer: Self-pay | Admitting: *Deleted

## 2018-11-23 MED ORDER — SERTRALINE HCL 100 MG PO TABS: 200.0000 mg | ORAL_TABLET | Freq: Every day | ORAL | 0 refills | Status: DC

## 2018-11-23 NOTE — Telephone Encounter (Signed)
ordered

## 2018-11-23 NOTE — Telephone Encounter (Signed)
Dr Modesta Messing Patient called requesting refill on Zoloft

## 2018-11-24 ENCOUNTER — Encounter: Payer: Self-pay | Admitting: Gastroenterology

## 2018-11-28 ENCOUNTER — Ambulatory Visit: Payer: No Typology Code available for payment source | Admitting: Gastroenterology

## 2018-11-28 ENCOUNTER — Encounter: Payer: Self-pay | Admitting: Gastroenterology

## 2018-11-28 VITALS — BP 170/104 | HR 114 | Ht 63.0 in | Wt 252.0 lb

## 2018-11-28 DIAGNOSIS — Z9049 Acquired absence of other specified parts of digestive tract: Secondary | ICD-10-CM

## 2018-11-28 DIAGNOSIS — R1011 Right upper quadrant pain: Secondary | ICD-10-CM

## 2018-11-28 DIAGNOSIS — R109 Unspecified abdominal pain: Secondary | ICD-10-CM

## 2018-11-28 MED ORDER — PANTOPRAZOLE SODIUM 40 MG PO TBEC: 40.0000 mg | DELAYED_RELEASE_TABLET | Freq: Every day | ORAL | 3 refills | Status: AC

## 2018-11-28 NOTE — Patient Instructions (Signed)
We have sent the following medications to your pharmacy for you to pick up at your convenience:  Pantoprazole 40 mg daily   You have been scheduled for an MRI at [facility] on 12-05-18. Your appointment time is 1:00 pm. Please arrive 15 minutes prior to your appointment time for registration purposes. Please make certain not to have anything to eat or drink 6 hours prior to your test. In addition, if you have any metal in your body, have a pacemaker or defibrillator, please be sure to let your ordering physician know. This test typically takes 45 minutes to 1 hour to complete. Should you need to reschedule, please call [phone] to do so.   We will refer you to a pain clinic and they will contact you with an appointment.

## 2018-11-28 NOTE — Progress Notes (Signed)
Referring Provider: No ref. provider found Primary Care Physician:  Patient, No Pcp Per   Reason for Consultation:  Right upper quadrant pain   IMPRESSION:  Abdominal wall pain RUQ pain    - normal CT 11/21/18 Elevated lipase at 65 during recent ER visit GERD Cholecystectomy 06/29/18 for chronic cholecystitis and cholelithiasis    - IOC negative for choledocholithiasis Recurrent sigmoid diverticulitis (reports 11 episodes over 10 years) with history of associated sigmoid abscess Colonoscopy 09/12/08 in [facility]    - normal TI, normal random colon, and normal duodenum    - procedure note is not available to me.  Family history of colon cancer (Grandmother and grandfather) Personal history of colon polyps (In [place] per patient report)     - colonoscopy 2017 with Dr. Marin Comment (results not available to me   Unlikely to be retained stone given recent labs, but that remains her primary concern. I suspect her symptoms are due to abdominal wall pain gain the description, location, and association with tensing of the abdominal musculature and changes in position.   PLAN: Continue Nexium 40 mg daily MRI/MRCP Referral for pain clinic re: abdominal wall pain to consider trigger point injection Obtained records from Dr. Marin Comment regarding her prior colonoscopy I have told the patient that I do not prescribe opioids for chronic abdominal pain   HPI: Deborah Pena is a 52 y.o. female with bipolar schizoaffective disorder, PTSD, alcohol use, recurrent sigmoid diverticulitis (reports 11 episodes over 10 years) with history of associated sigmoid abscess, and recent cholecystectomy for chronic cholecystitis and cholelithiasis. IOC negative for choledocholithiasis. She is seen of abdominal pain. The history is obtained through the patient and review of her electronic medical record.  She was seen by Dr. Marin Comment in in 2017. She had a colonoscopy at that time.    Developed abdominal pain 5 months prior to her  cholecystectomy. Cholecystectomy 06/29/18 for chronic cholecystitis and cholelithiasis. IOC negative for choledocholithiasis. Followed up with the surgeon 2 months later for pain. She did not feel like they did not take her seriously.  Initially her pain improved.  The pain has become progressively worse.   Seen in the ED for RUQ abdominal pain/RL thoracic pain 11/21/18. There was associated tachypnea and tachycardia. She reports a concurrent chest pain.  CT abd/pelvis with IV contrast 11/21/18 for acute right upper quadrant abdominal pain showed a normal liver, pancreas, spleen, stomach, small intestines, large intestines. No acute abnormalities identified. Labs showed a normal CMP including normal liver enzymes and normal CBC. WBC was 6.9.   Lipase of 65 (ULN 51). D-dimer was 0.35. Troponin I was 0.00. The ER doctor led her to believe that a stone is still lodged in her bile duct. Symptoms are identical to those that she experienced prior to the cholecystectomy.   Feels like a concrete balloon under her rib cage. Heavy. Associated cough. Also had a cough with the gallbladder.  Worsened by stretching or lifting her arm. Not driving because of the pain. Feels like her gallbladder is still there. Sharp pain alternating with a dull ache. No relationship to food. She feels like she has a good diet. Normal bowel movements. Uses daily fiber.   Tylenol, aspirin, heating pad, oxycodone, morphine, fentanyl do not provide adequate relief.  Percocet provided some relief in the ER. She is requesting more Percocet.  Started Nexium 40 mg QAM without any noted improvement yet.   Review of [facility] shows [facility] biopsies 09/12/08 that included normal TI, normal random colon,  and normal duodenum. The procedure note is not available to me.   Grandmother and grandfather had colon cancer. Prior colonoscopy in [place] with polyps.     Past Medical History:  Diagnosis Date  . Anemia   . Anginal pain ([place])   . Anxiety   .  Arthritis    "left hand" (04/26/2016)  . Asthma   . Chronic lower back pain   . Diverticulitis of colon   . GERD (gastroesophageal reflux disease)   . Headache    'once or twice/week" (04/26/2016)  . Migraine    "twice/month" (04/26/2016)  . Stomach ulcer     Past Surgical History:  Procedure Laterality Date  . ABDOMINAL HYSTERECTOMY  2013   preformed at [facility] by Dr Jacqualyn Posey in April  . BREAST SURGERY Bilateral ~ 2000   "dual lateral duct removal"   . BUNIONECTOMY Right 2013  . CESAREAN SECTION  1999  . CHOLECYSTECTOMY  06/29/2018  . CHOLECYSTECTOMY N/A 06/29/2018   Procedure: LAPAROSCOPIC CHOLECYSTECTOMY WITH INTRAOPERATIVE CHOLANGIOGRAM;  Surgeon: Alphonsa Overall, MD;  Location: [facility];  Service: General;  Laterality: N/A;  . OOPHORECTOMY Right 2013  . TONSILLECTOMY      Current Outpatient Medications  Medication Sig Dispense Refill  . acetaminophen (TYLENOL) 325 MG tablet Take 2 tablets (650 mg total) by mouth every 6 (six) hours as needed for mild pain.    Marland Kitchen albuterol (PROVENTIL HFA;VENTOLIN HFA) 108 (90 Base) MCG/ACT inhaler Inhale 2 puffs into the lungs every 6 (six) hours as needed for wheezing or shortness of breath.    . Multiple Vitamin (MULTIVITAMIN WITH MINERALS) TABS tablet Take 1 tablet by mouth daily. Vitamin supplement    . pantoprazole (PROTONIX) 40 MG tablet Take 1 tablet (40 mg total) by mouth daily. 30 tablet 3  . ALPRAZolam (XANAX) 0.5 MG tablet Take 1 tablet (0.5 mg total) by mouth 2 (two) times daily as needed for anxiety or sleep. 60 tablet 2  . cariprazine (VRAYLAR) capsule Take 1 capsule (3 mg total) by mouth daily. 30 capsule 2  . gabapentin (NEURONTIN) 300 MG capsule Take 1 capsule (300 mg total) by mouth daily. 10 capsule 0  . ondansetron (ZOFRAN) 4 MG tablet Take 1 tablet (4 mg total) by mouth every 6 (six) hours. 12 tablet 0  . oxyCODONE-acetaminophen (PERCOCET/ROXICET) 5-325 MG tablet Take 1 tablet by mouth every 4 (four) hours as needed for severe  pain. 10 tablet 0  . sertraline (ZOLOFT) 100 MG tablet Take 2 tablets (200 mg total) by mouth daily. For depression 60 tablet 2  . zolpidem (AMBIEN) 5 MG tablet Take 1 tablet (5 mg total) by mouth at bedtime as needed for sleep. 30 tablet 2   No current facility-administered medications for this visit.     Allergies as of 11/28/2018 - Review Complete 11/21/2018  Allergen Reaction Noted  . Lamictal [lamotrigine] Shortness Of Breath, Rash, and Other (See Comments) 10/09/2017  . Lithium Anaphylaxis 06/29/2018  . Buprenorphine hcl Other (See Comments) 04/19/2016  . Ciprofloxacin Itching and Other (See Comments) 04/26/2016  . Propranolol Other (See Comments) 04/19/2016    Family History  Problem Relation Age of Onset  . Heart failure Father   . Bipolar disorder Father   . Schizophrenia Father   . Diabetes Other   . Breast cancer Other   . Diverticulitis Other   . Mental illness Paternal Grandmother   . Mental illness Paternal Grandfather   . Schizophrenia Paternal Aunt   . Suicidality Maternal Aunt  Social History   Socioeconomic History  . Marital status: Legally Separated    Spouse name: Not on file  . Number of children: Not on file  . Years of education: Not on file  . Highest education level: Not on file  Occupational History  . Not on file  Social Needs  . Financial resource strain: Not on file  . Food insecurity:    Worry: Not on file    Inability: Not on file  . Transportation needs:    Medical: Not on file    Non-medical: Not on file  Tobacco Use  . Smoking status: Former Smoker    Packs/day: 2.00    Years: 30.00    Pack years: 60.00    Types: Cigarettes  . Smokeless tobacco: Former Systems developer    Types: Chew  . Tobacco comment: "quit smoking cigarettes in ~ 2011; chewed when I was little"  Substance and Sexual Activity  . Alcohol use: No  . Drug use: No  . Sexual activity: Not Currently  Lifestyle  . Physical activity:    Days per week: Not on file     Minutes per session: Not on file  . Stress: Not on file  Relationships  . Social connections:    Talks on phone: Not on file    Gets together: Not on file    Attends religious service: Not on file    Active member of club or organization: Not on file    Attends meetings of clubs or organizations: Not on file    Relationship status: Not on file  . Intimate partner violence:    Fear of current or ex partner: Not on file    Emotionally abused: Not on file    Physically abused: Not on file    Forced sexual activity: Not on file  Other Topics Concern  . Not on file  Social History Narrative  . Not on file    Review of Systems: 12 system ROS is negative except as noted above.  Filed Weights   11/28/18 1503  Weight: 252 lb (114.3 kg)    Physical Exam: Vital signs were reviewed. General:   Alert, well-nourished, pleasant and cooperative in NAD Head:  Normocephalic and atraumatic. Eyes:  Sclera clear, no icterus.   Conjunctiva pink. Mouth:  No deformity or lesions.   Neck:  Supple; no thyromegaly. Lungs:  Clear throughout to auscultation.   No wheezes.  Heart:  Regular rate and rhythm; no murmurs Abdomen:  Soft, well-healed cholecystectomy scars with tenderness with light and deep palpation at the RUQ scar, normal bowel sounds. Subjective guarding. No rebound. No hepatosplenomegaly Rectal:  Deferred  Msk:  Symmetrical without gross deformities. Extremities:  No gross deformities or edema. Neurologic:  Alert and  oriented x4;  grossly nonfocal Skin:  No rash or bruise. Psych:  Alert and cooperative. Normal mood and affect.   Dickie Cloe L. Tarri Glenn, MD, MPH [facility] 12/07/2018, 4:23 PM

## 2018-11-29 ENCOUNTER — Emergency Department (HOSPITAL_COMMUNITY)
Admission: EM | Admit: 2018-11-29 | Discharge: 2018-11-30 | Disposition: A | Discharge status: Home / Self Care | Payer: No Typology Code available for payment source | Attending: Emergency Medicine | Admitting: Emergency Medicine

## 2018-11-29 ENCOUNTER — Encounter (HOSPITAL_COMMUNITY): Payer: Self-pay | Admitting: Emergency Medicine

## 2018-11-29 ENCOUNTER — Other Ambulatory Visit: Payer: Self-pay

## 2018-11-29 DIAGNOSIS — Z87891 Personal history of nicotine dependence: Secondary | ICD-10-CM | POA: Insufficient documentation

## 2018-11-29 DIAGNOSIS — J45909 Unspecified asthma, uncomplicated: Secondary | ICD-10-CM | POA: Insufficient documentation

## 2018-11-29 DIAGNOSIS — R1011 Right upper quadrant pain: Secondary | ICD-10-CM | POA: Diagnosis not present

## 2018-11-29 LAB — CBC
HCT: 42 % (ref 36.0–46.0)
Hemoglobin: 13.2 g/dL (ref 12.0–15.0)
MCH: 29.6 pg (ref 26.0–34.0)
MCHC: 31.4 g/dL (ref 30.0–36.0)
MCV: 94.2 fL (ref 80.0–100.0)
Platelets: 363 10*3/uL (ref 150–400)
RBC: 4.46 MIL/uL (ref 3.87–5.11)
RDW: 12.1 % (ref 11.5–15.5)
WBC: 10.9 10*3/uL — AB (ref 4.0–10.5)
nRBC: 0 % (ref 0.0–0.2)

## 2018-11-29 LAB — COMPREHENSIVE METABOLIC PANEL
ALT: 55 U/L — ABNORMAL HIGH (ref 0–44)
AST: 41 U/L (ref 15–41)
Albumin: 4.2 g/dL (ref 3.5–5.0)
Alkaline Phosphatase: 89 U/L (ref 38–126)
Anion gap: 14 (ref 5–15)
BUN: 19 mg/dL (ref 6–20)
CO2: 23 mmol/L (ref 22–32)
CREATININE: 0.95 mg/dL (ref 0.44–1.00)
Calcium: 9.5 mg/dL (ref 8.9–10.3)
Chloride: 103 mmol/L (ref 98–111)
GFR calc Af Amer: >60 mL/min (ref >60)
GFR calc non Af Amer: >60 mL/min (ref >60)
Glucose, Bld: 116 mg/dL — ABNORMAL HIGH (ref 70–99)
Potassium: 3.7 mmol/L (ref 3.5–5.1)
Sodium: 140 mmol/L (ref 135–145)
Total Bilirubin: 0.4 mg/dL (ref 0.3–1.2)
Total Protein: 7.1 g/dL (ref 6.5–8.1)

## 2018-11-29 LAB — URINALYSIS, ROUTINE W REFLEX MICROSCOPIC
Bilirubin Urine: NEGATIVE
Glucose, UA: NEGATIVE mg/dL
Hgb urine dipstick: NEGATIVE
Ketones, ur: NEGATIVE mg/dL
Leukocytes, UA: NEGATIVE
Nitrite: NEGATIVE
Protein, ur: NEGATIVE mg/dL
Specific Gravity, Urine: 1.02 (ref 1.005–1.030)
pH: 5 (ref 5.0–8.0)

## 2018-11-29 LAB — I-STAT BETA HCG BLOOD, ED (MC, WL, AP ONLY): I-stat hCG, quantitative: <5 m[IU]/mL (ref <5)

## 2018-11-29 LAB — LIPASE, BLOOD: Lipase: 64 U/L — ABNORMAL HIGH (ref 11–51)

## 2018-11-29 MED ORDER — OXYCODONE-ACETAMINOPHEN 5-325 MG PO TABS: 1.0000 | ORAL_TABLET | ORAL | 0 refills | Status: DC | PRN

## 2018-11-29 MED ORDER — GABAPENTIN 300 MG PO CAPS
300.0000 mg | ORAL_CAPSULE | Freq: Once | ORAL | Status: AC
  Administered 2018-11-30: 300 mg via ORAL
  Filled 2018-11-29: qty 1

## 2018-11-29 MED ORDER — GABAPENTIN 300 MG PO CAPS: 300.0000 mg | ORAL_CAPSULE | Freq: Every day | ORAL | 0 refills | Status: DC

## 2018-11-29 MED ORDER — OXYCODONE-ACETAMINOPHEN 5-325 MG PO TABS
2.0000 | ORAL_TABLET | Freq: Once | ORAL | Status: AC
  Administered 2018-11-30: 2 via ORAL
  Filled 2018-11-29: qty 2

## 2018-11-29 MED ORDER — ONDANSETRON HCL 4 MG PO TABS: 4.0000 mg | ORAL_TABLET | Freq: Four times a day (QID) | ORAL | 0 refills | Status: DC

## 2018-11-29 NOTE — Discharge Instructions (Addendum)
Keep your scheduled appointment for MR study next week and return to gastroenterology for results. Take medications as prescribed. Your primary care provider can help with pain management and you are encouraged to make an appointment with them to manage pain until a resolution is identified.

## 2018-11-29 NOTE — ED Triage Notes (Signed)
Pt BIB [facility], c/o RUQ pain, nausea and vomiting x 2 weeks. hx gallbladder removal x 5 months ago. Seen at [facility] last week for same. Given 4mg  zofran and 211mcg fentanyl PTA. EMS vitals: BP 181/97, HR 106, SpO2 98% room air, CBG 105

## 2018-11-29 NOTE — ED Provider Notes (Signed)
[facility] EMERGENCY DEPARTMENT Provider Note   CSN: [id number] Arrival date & time: 11/29/18  1928     History   Chief Complaint Chief Complaint  Patient presents with  . Abdominal Pain    HPI Deborah Pena is a 52 y.o. female.  Patient with history of anxiety, schizoaffective DO, ADD, migraine, chronic low back pain, asthma, s/p cholecystectomy 7/19, GERD presents to the ED with RUQ abdominal pain. This pain has been ongoing for 2 weeks, becoming severe today. Seen previously 11/21/18 in the ED for same and referred to GI for further management. She reports she was seen 2 days ago and is scheduled for an outpatient "MRI", assumed to be MRCP. She also states they want to refer her to Pain Management.   The history is provided by the patient. No language interpreter was used.  Abdominal Pain   Associated symptoms include nausea and vomiting. Pertinent negatives include fever, diarrhea, constipation, dysuria and frequency.    Past Medical History:  Diagnosis Date  . Anemia   . Anginal pain ([place])   . Anxiety   . Arthritis    "left hand" (04/26/2016)  . Asthma   . Chronic lower back pain   . Diverticulitis of colon   . GERD (gastroesophageal reflux disease)   . Headache    'once or twice/week" (04/26/2016)  . Migraine    "twice/month" (04/26/2016)  . Stomach ulcer     Patient Active Problem List   Diagnosis Date Noted  . Acute cholecystitis 06/29/2018  . Cholecystitis 06/29/2018  . Schizoaffective disorder, bipolar type ([place]) 10/10/2017  . Hyperprolactinemia ([place]) 08/16/2017  . ADD (attention deficit disorder) 08/14/2017  . Alcohol abuse 03/13/2017  . PTSD (post-traumatic stress disorder) 01/16/2017  . Panic disorder 01/16/2017  . Acute diverticulitis 04/26/2016  . Diverticulitis of large intestine with abscess without bleeding 04/26/2016  . Intractable migraine 07/27/2014  . Insomnia 05/26/2013    Past Surgical History:  Procedure Laterality  Date  . ABDOMINAL HYSTERECTOMY  2013   preformed at [facility] by Dr Jacqualyn Posey in April  . BREAST SURGERY Bilateral ~ 2000   "dual lateral duct removal"   . BUNIONECTOMY Right 2013  . CESAREAN SECTION  1999  . CHOLECYSTECTOMY  06/29/2018  . CHOLECYSTECTOMY N/A 06/29/2018   Procedure: LAPAROSCOPIC CHOLECYSTECTOMY WITH INTRAOPERATIVE CHOLANGIOGRAM;  Surgeon: Alphonsa Overall, MD;  Location: [facility];  Service: General;  Laterality: N/A;  . OOPHORECTOMY Right 2013  . TONSILLECTOMY       OB History   None      Home Medications    Prior to Admission medications   Medication Sig Start Date End Date Taking? Authorizing Provider  acetaminophen (TYLENOL) 325 MG tablet Take 2 tablets (650 mg total) by mouth every 6 (six) hours as needed for mild pain. 10/14/17   Lindell Spar I, NP  albuterol (PROVENTIL HFA;VENTOLIN HFA) 108 (90 Base) MCG/ACT inhaler Inhale 2 puffs into the lungs every 6 (six) hours as needed for wheezing or shortness of breath. 10/14/17   Lindell Spar I, NP  Cariprazine HCl 1.5 & 3 MG CPPK Take 1 capsule by mouth daily.    [provider]  Multiple Vitamin (MULTIVITAMIN WITH MINERALS) TABS tablet Take 1 tablet by mouth daily. Vitamin supplement 10/14/17   Lindell Spar I, NP  pantoprazole (PROTONIX) 40 MG tablet Take 1 tablet (40 mg total) by mouth daily. 11/28/18   Thornton Park, MD  sertraline (ZOLOFT) 100 MG tablet Take 2 tablets (200 mg total) by mouth  daily. For depression 11/23/18   Norman Clay, MD    Family History Family History  Problem Relation Age of Onset  . Heart failure Father   . Bipolar disorder Father   . Schizophrenia Father   . Diabetes Other   . Breast cancer Other   . Diverticulitis Other   . Mental illness Paternal Grandmother   . Mental illness Paternal Grandfather   . Schizophrenia Paternal Aunt   . Suicidality Maternal Aunt     Social History Social History   Tobacco Use  . Smoking status: Former Smoker    Packs/day: 2.00     Years: 30.00    Pack years: 60.00    Types: Cigarettes  . Smokeless tobacco: Former Systems developer    Types: Chew  . Tobacco comment: "quit smoking cigarettes in ~ 2011; chewed when I was little"  Substance Use Topics  . Alcohol use: No  . Drug use: No     Allergies   Lamictal [lamotrigine]; Lithium; Buprenorphine hcl; Ciprofloxacin; and Propranolol   Review of Systems Review of Systems  Constitutional: Negative for chills and fever.  HENT: Negative.   Respiratory: Negative.   Cardiovascular: Negative.   Gastrointestinal: Positive for abdominal pain, nausea and vomiting. Negative for constipation and diarrhea.  Genitourinary: Negative.  Negative for dysuria and frequency.  Musculoskeletal: Negative.   Skin: Negative.   Neurological: Negative.      Physical Exam Updated Vital Signs BP 135/71   Pulse 94   Temp 98.1 F (36.7 C) (Oral)   Resp 20   SpO2 99%   Physical Exam  Constitutional: She appears well-developed and well-nourished.  HENT:  Head: Normocephalic.  Neck: Normal range of motion. Neck supple.  Cardiovascular: Normal rate and regular rhythm.  Pulmonary/Chest: Effort normal and breath sounds normal.  Abdominal: Soft. Bowel sounds are normal. There is tenderness in the right upper quadrant and epigastric area. There is no rebound and no guarding.  Musculoskeletal: Normal range of motion.  Neurological: She is alert. No cranial nerve deficit.  Skin: Skin is warm and dry. No rash noted.  Psychiatric: She has a normal mood and affect.     ED Treatments / Results  Labs (all labs ordered are listed, but only abnormal results are displayed) Labs Reviewed  LIPASE, BLOOD - Abnormal; Notable for the following components:      Result Value   Lipase 64 (*)    All other components within normal limits  COMPREHENSIVE METABOLIC PANEL - Abnormal; Notable for the following components:   Glucose, Bld 116 (*)    ALT 55 (*)    All other components within normal limits  CBC  - Abnormal; Notable for the following components:   WBC 10.9 (*)    All other components within normal limits  URINALYSIS, ROUTINE W REFLEX MICROSCOPIC  I-STAT BETA HCG BLOOD, ED ([facility], [facility], [facility] ONLY)    EKG None  Radiology No results found.  Procedures Procedures (including critical care time)  Medications Ordered in ED Medications - No data to display   Initial Impression / Assessment and Plan / ED Course  I have reviewed the triage vital signs and the nursing notes.  Pertinent labs & imaging results that were available during my care of the patient were reviewed by me and considered in my medical decision making (see chart for details).     Patient to ED with c/o persistent RUQ/right lower chest pain x 3 weeks. S/P cholecystectomy 7/19. Seen 12/3 for ED visit and has  had GI follow up with further outpatient studies scheduled for next week, presumed to be MRCP. Patient here for uncontrolled pain.   Labs show a mild elevation of lipase at 64. No significant leukocytosis. No vomiting in ED. VSS. Afebrile.   Patient care/condition reviewed with Dr. Jeanell Sparrow. She is felt to be stable, requiring pain management and is encouraged to follow up with her already scheduled outpatient study next week. Discussed pain management at home. She states she took a friend's "muscle relaxer" called gabapentin that helped her pain. PMP Aware data base reviewed. Will prescribe Percocet #10, gabapentin 300 mg QD, Zofran 4 mg.     Final Clinical Impressions(s) / ED Diagnoses   Final diagnoses:  None   1. RUQ abdominal pain  ED Discharge Orders    None       Charlann Lange, PA-C 11/29/18 2348    Pattricia Boss, MD 11/30/18 [phone]

## 2018-11-30 NOTE — ED Notes (Signed)
PT states understanding of care given, follow up care, and medication prescribed. PT ambulated from ED to car with a steady gait. 
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
## 2018-12-05 ENCOUNTER — Telehealth: Payer: Self-pay | Admitting: Gastroenterology

## 2018-12-05 ENCOUNTER — Ambulatory Visit (HOSPITAL_COMMUNITY): Payer: Self-pay

## 2018-12-05 NOTE — Telephone Encounter (Signed)
No answer and her mailbox is full.

## 2018-12-06 NOTE — Telephone Encounter (Signed)
Left message for patient to call back  

## 2018-12-07 ENCOUNTER — Ambulatory Visit (INDEPENDENT_AMBULATORY_CARE_PROVIDER_SITE_OTHER): Payer: No Typology Code available for payment source | Admitting: Psychiatry

## 2018-12-07 ENCOUNTER — Encounter: Payer: Self-pay | Admitting: Gastroenterology

## 2018-12-07 ENCOUNTER — Encounter (HOSPITAL_COMMUNITY): Payer: Self-pay | Admitting: Psychiatry

## 2018-12-07 VITALS — BP 140/86 | HR 76 | Ht 63.0 in | Wt 250.0 lb

## 2018-12-07 DIAGNOSIS — F259 Schizoaffective disorder, unspecified: Secondary | ICD-10-CM | POA: Diagnosis not present

## 2018-12-07 MED ORDER — CARIPRAZINE HCL 3 MG PO CAPS: 3.0000 mg | ORAL_CAPSULE | Freq: Every day | ORAL | 2 refills | Status: AC

## 2018-12-07 MED ORDER — ZOLPIDEM TARTRATE 5 MG PO TABS: 5.0000 mg | ORAL_TABLET | Freq: Every evening | ORAL | 2 refills | Status: DC | PRN

## 2018-12-07 MED ORDER — SERTRALINE HCL 100 MG PO TABS: 200.0000 mg | ORAL_TABLET | Freq: Every day | ORAL | 2 refills | Status: AC

## 2018-12-07 MED ORDER — ALPRAZOLAM 0.5 MG PO TABS: 0.5000 mg | ORAL_TABLET | Freq: Two times a day (BID) | ORAL | 2 refills | Status: DC | PRN

## 2018-12-07 NOTE — Patient Instructions (Signed)
1. Continue Vraylar 3 mg daily  2. Continuesertraline200mg  daily 3. Continue Ambien 5 mg daily at night as needed for sleep 4. Continue Xanax 0.5 mg twice a day as needed for anxiety 5. Return to clinic in three months for 15 mins

## 2018-12-08 NOTE — Telephone Encounter (Signed)
No return call from the patient 

## 2018-12-12 ENCOUNTER — Other Ambulatory Visit (HOSPITAL_COMMUNITY): Payer: Self-pay | Admitting: Psychiatry

## 2018-12-12 ENCOUNTER — Telehealth (HOSPITAL_COMMUNITY): Payer: Self-pay | Admitting: *Deleted

## 2018-12-12 NOTE — Telephone Encounter (Signed)
Patient has lost/misplace her Xanax medication. Frantically calling upset not sure what to do also called Rx. Spoke with Dr Modesta Messing & she allowed for Rx to refill 1 time only due to circumstance. Rx  & patient have been notiifed.

## 2018-12-12 NOTE — Telephone Encounter (Signed)
Per patient, she lost Xanax after receiving medication. Staff will contact the pharmacy to approve earlier refill.

## 2019-04-16 ENCOUNTER — Ambulatory Visit (INDEPENDENT_AMBULATORY_CARE_PROVIDER_SITE_OTHER): Payer: BLUE CROSS/BLUE SHIELD | Admitting: Nurse Practitioner

## 2019-04-16 ENCOUNTER — Encounter: Payer: Self-pay | Admitting: Nurse Practitioner

## 2019-04-16 VITALS — Ht 63.0 in | Wt 245.0 lb

## 2019-04-16 DIAGNOSIS — R600 Localized edema: Secondary | ICD-10-CM

## 2019-04-16 NOTE — Progress Notes (Signed)
Virtual Visit via Video Note  I connected with Caylyn Tedeschi on 04/17/19 at  3:00 PM EDT by a video enabled telemedicine application and verified that I am speaking with the correct person using two identifiers.   I discussed the limitations of evaluation and management by telemedicine and the availability of in person appointments. The patient expressed understanding and agreed to proceed.  Provider:[facility] Patient: Home  CC: edema.  History of Present Illness: Ms. adamek reports bilateral LE swelling x 5days, waxing and waning, improves with elevation, states she maintains a low sodium diet (avoids processed meats, fast food and adding salt to meals), denies consumption of sodas. Edema has led to LE pain at night, which improves with ibuprofen 800mg .  Review of Systems  Constitutional: Negative.   Respiratory: Negative.   Cardiovascular: Positive for leg swelling. Negative for chest pain, palpitations and PND.  Musculoskeletal: Negative.   Skin: Negative.    Reviewed medication list, medical and surgical history with patient.  Observations/Objective: No vital signs to share. Physical Exam  Constitutional: She is oriented to person, place, and time. No distress.  Pulmonary/Chest: Effort normal.  Musculoskeletal:        General: No edema.  Neurological: She is alert and oriented to person, place, and time.  Skin: No rash noted. No erythema.  Psychiatric: She has a normal mood and affect.  Vitals reviewed.  Assessment and Plan: Jawanda was seen today for establish care.  Diagnoses and all orders for this visit:  Localized edema   Follow Up Instructions: Stop use of OTC duiretic. Decrease ibuprofen dose to 200-400mg  BID prn only (with food) Use compression stocking during the day and off at night. Maintain low sodium diet. Check BP and weight every other day and record. Send readings via [employer]. F/up in office in 2weeks.   I discussed the assessment and  treatment plan with the patient. The patient was provided an opportunity to ask questions and all were answered. The patient agreed with the plan and demonstrated an understanding of the instructions.   The patient was advised to call back or seek an in-person evaluation if the symptoms worsen or if the condition fails to improve as anticipated.   Wilfred Lacy, NP

## 2019-04-16 NOTE — Patient Instructions (Signed)
Stop use of OTC duiretic. Decrease ibuprofen dose to 200-400mg  BID prn only (with food) Use compression stocking during the day and off at night. Maintain low sodium diet. Check BP and weight every other day and record. Send readings via [employer]. F/up in office in 2weeks.

## 2019-04-16 NOTE — Assessment & Plan Note (Signed)
Possibly medication side effects. F/up in office for labs (TSH, BNP and BMP). Advised to monitor weight and BP at home.

## 2019-04-17 ENCOUNTER — Encounter: Payer: Self-pay | Admitting: Nurse Practitioner

## 2019-04-27 ENCOUNTER — Ambulatory Visit: Payer: BLUE CROSS/BLUE SHIELD | Admitting: Nurse Practitioner

## 2021-01-24 ENCOUNTER — Emergency Department (HOSPITAL_COMMUNITY)
Admission: EM | Admit: 2021-01-24 | Discharge: 2021-01-24 | Disposition: A | Discharge status: Home / Self Care | Payer: Self-pay | Attending: Emergency Medicine | Admitting: Emergency Medicine

## 2021-01-24 ENCOUNTER — Emergency Department (HOSPITAL_COMMUNITY): Payer: Self-pay

## 2021-01-24 ENCOUNTER — Other Ambulatory Visit: Payer: Self-pay

## 2021-01-24 ENCOUNTER — Encounter (HOSPITAL_COMMUNITY): Payer: Self-pay

## 2021-01-24 DIAGNOSIS — R4182 Altered mental status, unspecified: Secondary | ICD-10-CM | POA: Insufficient documentation

## 2021-01-24 DIAGNOSIS — F329 Major depressive disorder, single episode, unspecified: Secondary | ICD-10-CM | POA: Insufficient documentation

## 2021-01-24 DIAGNOSIS — J45909 Unspecified asthma, uncomplicated: Secondary | ICD-10-CM | POA: Insufficient documentation

## 2021-01-24 DIAGNOSIS — Z87891 Personal history of nicotine dependence: Secondary | ICD-10-CM | POA: Insufficient documentation

## 2021-01-24 LAB — COMPREHENSIVE METABOLIC PANEL
ALT: 25 U/L (ref 0–44)
AST: 20 U/L (ref 15–41)
Albumin: 4 g/dL (ref 3.5–5.0)
Alkaline Phosphatase: 64 U/L (ref 38–126)
Anion gap: 12 (ref 5–15)
BUN: 7 mg/dL (ref 6–20)
CO2: 22 mmol/L (ref 22–32)
Calcium: 9.6 mg/dL (ref 8.9–10.3)
Chloride: 107 mmol/L (ref 98–111)
Creatinine, Ser: 1.17 mg/dL — ABNORMAL HIGH (ref 0.44–1.00)
GFR, Estimated: 56 mL/min — ABNORMAL LOW (ref >60)
Glucose, Bld: 148 mg/dL — ABNORMAL HIGH (ref 70–99)
Potassium: 3.7 mmol/L (ref 3.5–5.1)
Sodium: 141 mmol/L (ref 135–145)
Total Bilirubin: 0.6 mg/dL (ref 0.3–1.2)
Total Protein: 6.9 g/dL (ref 6.5–8.1)

## 2021-01-24 LAB — URINALYSIS, ROUTINE W REFLEX MICROSCOPIC
Bilirubin Urine: NEGATIVE
Glucose, UA: NEGATIVE mg/dL
Hgb urine dipstick: NEGATIVE
Ketones, ur: NEGATIVE mg/dL
Leukocytes,Ua: NEGATIVE
Nitrite: NEGATIVE
Protein, ur: NEGATIVE mg/dL
Specific Gravity, Urine: 1.009 (ref 1.005–1.030)
pH: 6 (ref 5.0–8.0)

## 2021-01-24 LAB — CBC
HCT: 44.7 % (ref 36.0–46.0)
Hemoglobin: 14.6 g/dL (ref 12.0–15.0)
MCH: 30.7 pg (ref 26.0–34.0)
MCHC: 32.7 g/dL (ref 30.0–36.0)
MCV: 94.1 fL (ref 80.0–100.0)
Platelets: 293 10*3/uL (ref 150–400)
RBC: 4.75 MIL/uL (ref 3.87–5.11)
RDW: 12.2 % (ref 11.5–15.5)
WBC: 6.4 10*3/uL (ref 4.0–10.5)
nRBC: 0 % (ref 0.0–0.2)

## 2021-01-24 LAB — CBG MONITORING, ED: Glucose-Capillary: 138 mg/dL — ABNORMAL HIGH (ref 70–99)

## 2021-01-24 LAB — ETHANOL: Alcohol, Ethyl (B): <10 mg/dL (ref <10)

## 2021-01-24 LAB — RAPID URINE DRUG SCREEN, HOSP PERFORMED
Amphetamines: POSITIVE — AB
Barbiturates: NOT DETECTED
Benzodiazepines: POSITIVE — AB
Cocaine: NOT DETECTED
Opiates: NOT DETECTED
Tetrahydrocannabinol: NOT DETECTED

## 2021-01-24 LAB — ACETAMINOPHEN LEVEL: Acetaminophen (Tylenol), Serum: <10 ug/mL — ABNORMAL LOW (ref 10–30)

## 2021-01-24 LAB — I-STAT BETA HCG BLOOD, ED (MC, WL, AP ONLY): I-stat hCG, quantitative: <5 m[IU]/mL (ref <5)

## 2021-01-24 LAB — SALICYLATE LEVEL: Salicylate Lvl: <7 mg/dL — ABNORMAL LOW (ref 7.0–30.0)

## 2021-01-24 MED ORDER — SODIUM CHLORIDE 0.9 % IV BOLUS
1000.0000 mL | Freq: Once | INTRAVENOUS | Status: AC
  Administered 2021-01-24: 1000 mL via INTRAVENOUS

## 2021-01-24 NOTE — ED Notes (Signed)
Walked patient to the bathroom patient is now back in bed in a gown on the monitor patient is resting with call bell in reach

## 2021-01-24 NOTE — ED Triage Notes (Signed)
Patient arrived by [facility] from home after being found altered by her children. Patient easily aroused to verbal command. Empty bottles of the following found- seroquel ambien Xanax Doxepin adderall  CBG 126
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
## 2021-01-24 NOTE — Discharge Instructions (Addendum)
You were seen in the ED for episode of decreased mental status, passing out  Lab work, imaging done today is unrevealing  There was benzodiazepines and amphetamines in your urine drug screen.  You were monitored on cardiac monitor for almost 8 hours and there were no acute or abnormal cardiac events  Psychiatry evaluated you and they deemed you appropriate for discharge  Please call your psychiatrist next week for reevaluation in the clinic and any medication modifications  Stay hydrated  Avoid alcohol, recreational drug use or taking any medicines that have not been formally prescribed to you  Return to the ED for chest pain, shortness of breath, recurrent passing out episodes, shortness of breath, thoughts of suicide or homicide
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
## 2021-01-24 NOTE — ED Notes (Signed)
EDP was given pt's daughter's contact info to give her an update as requested by pt.

## 2021-01-24 NOTE — ED Notes (Signed)
Assisted pt to bedside toilet.

## 2021-01-24 NOTE — ED Notes (Signed)
TTS in use at this time.

## 2021-07-26 ENCOUNTER — Emergency Department (HOSPITAL_COMMUNITY): Payer: Self-pay

## 2021-07-26 ENCOUNTER — Encounter (HOSPITAL_COMMUNITY): Payer: Self-pay | Admitting: Emergency Medicine

## 2021-07-26 ENCOUNTER — Other Ambulatory Visit: Payer: Self-pay

## 2021-07-26 ENCOUNTER — Emergency Department (HOSPITAL_COMMUNITY)
Admission: EM | Admit: 2021-07-26 | Discharge: 2021-07-27 | Disposition: A | Discharge status: Home / Self Care | Payer: Self-pay | Attending: Student | Admitting: Student

## 2021-07-26 DIAGNOSIS — J45909 Unspecified asthma, uncomplicated: Secondary | ICD-10-CM | POA: Insufficient documentation

## 2021-07-26 DIAGNOSIS — G43809 Other migraine, not intractable, without status migrainosus: Secondary | ICD-10-CM

## 2021-07-26 DIAGNOSIS — R000 Tachycardia, unspecified: Secondary | ICD-10-CM | POA: Insufficient documentation

## 2021-07-26 DIAGNOSIS — J029 Acute pharyngitis, unspecified: Secondary | ICD-10-CM | POA: Insufficient documentation

## 2021-07-26 DIAGNOSIS — R791 Abnormal coagulation profile: Secondary | ICD-10-CM | POA: Insufficient documentation

## 2021-07-26 DIAGNOSIS — Z20822 Contact with and (suspected) exposure to covid-19: Secondary | ICD-10-CM | POA: Insufficient documentation

## 2021-07-26 DIAGNOSIS — Z87891 Personal history of nicotine dependence: Secondary | ICD-10-CM | POA: Insufficient documentation

## 2021-07-26 DIAGNOSIS — M542 Cervicalgia: Secondary | ICD-10-CM | POA: Insufficient documentation

## 2021-07-26 DIAGNOSIS — D72829 Elevated white blood cell count, unspecified: Secondary | ICD-10-CM | POA: Insufficient documentation

## 2021-07-26 DIAGNOSIS — R519 Headache, unspecified: Secondary | ICD-10-CM | POA: Insufficient documentation

## 2021-07-26 DIAGNOSIS — H9209 Otalgia, unspecified ear: Secondary | ICD-10-CM | POA: Insufficient documentation

## 2021-07-26 LAB — DIFFERENTIAL
Abs Immature Granulocytes: 0.05 10*3/uL (ref 0.00–0.07)
Basophils Absolute: 0 10*3/uL (ref 0.0–0.1)
Basophils Relative: 0 %
Eosinophils Absolute: 0.1 10*3/uL (ref 0.0–0.5)
Eosinophils Relative: 1 %
Immature Granulocytes: 0 %
Lymphocytes Relative: 19 %
Lymphs Abs: 2.5 10*3/uL (ref 0.7–4.0)
Monocytes Absolute: 0.8 10*3/uL (ref 0.1–1.0)
Monocytes Relative: 6 %
Neutro Abs: 10.1 10*3/uL — ABNORMAL HIGH (ref 1.7–7.7)
Neutrophils Relative %: 74 %

## 2021-07-26 LAB — RESP PANEL BY RT-PCR (FLU A&B, COVID) ARPGX2
Influenza A by PCR: NEGATIVE
Influenza B by PCR: NEGATIVE
SARS Coronavirus 2 by RT PCR: NEGATIVE

## 2021-07-26 LAB — I-STAT CHEM 8, ED
BUN: 4 mg/dL — ABNORMAL LOW (ref 6–20)
Calcium, Ion: 1.14 mmol/L — ABNORMAL LOW (ref 1.15–1.40)
Chloride: 108 mmol/L (ref 98–111)
Creatinine, Ser: 1 mg/dL (ref 0.44–1.00)
Glucose, Bld: 150 mg/dL — ABNORMAL HIGH (ref 70–99)
HCT: 43 % (ref 36.0–46.0)
Hemoglobin: 14.6 g/dL (ref 12.0–15.0)
Potassium: 3.8 mmol/L (ref 3.5–5.1)
Sodium: 144 mmol/L (ref 135–145)
TCO2: 21 mmol/L — ABNORMAL LOW (ref 22–32)

## 2021-07-26 LAB — COMPREHENSIVE METABOLIC PANEL
ALT: 110 U/L — ABNORMAL HIGH (ref 0–44)
AST: 87 U/L — ABNORMAL HIGH (ref 15–41)
Albumin: 3.9 g/dL (ref 3.5–5.0)
Alkaline Phosphatase: 85 U/L (ref 38–126)
Anion gap: 15 (ref 5–15)
BUN: 6 mg/dL (ref 6–20)
CO2: 19 mmol/L — ABNORMAL LOW (ref 22–32)
Calcium: 9.4 mg/dL (ref 8.9–10.3)
Chloride: 107 mmol/L (ref 98–111)
Creatinine, Ser: 0.95 mg/dL (ref 0.44–1.00)
GFR, Estimated: >60 mL/min (ref >60)
Glucose, Bld: 150 mg/dL — ABNORMAL HIGH (ref 70–99)
Potassium: 3.8 mmol/L (ref 3.5–5.1)
Sodium: 141 mmol/L (ref 135–145)
Total Bilirubin: 0.5 mg/dL (ref 0.3–1.2)
Total Protein: 7.1 g/dL (ref 6.5–8.1)

## 2021-07-26 LAB — CBC
HCT: 42.7 % (ref 36.0–46.0)
Hemoglobin: 14.5 g/dL (ref 12.0–15.0)
MCH: 31.3 pg (ref 26.0–34.0)
MCHC: 34 g/dL (ref 30.0–36.0)
MCV: 92.2 fL (ref 80.0–100.0)
Platelets: 282 10*3/uL (ref 150–400)
RBC: 4.63 MIL/uL (ref 3.87–5.11)
RDW: 12.4 % (ref 11.5–15.5)
WBC: 13.7 10*3/uL — ABNORMAL HIGH (ref 4.0–10.5)
nRBC: 0 % (ref 0.0–0.2)

## 2021-07-26 LAB — TROPONIN I (HIGH SENSITIVITY)
Troponin I (High Sensitivity): 8 ng/L (ref <18)
Troponin I (High Sensitivity): 7 ng/L (ref <18)

## 2021-07-26 LAB — PROTIME-INR
INR: 0.9 (ref 0.8–1.2)
Prothrombin Time: 12 seconds (ref 11.4–15.2)

## 2021-07-26 LAB — APTT: aPTT: 29 seconds (ref 24–36)

## 2021-07-26 LAB — I-STAT BETA HCG BLOOD, ED (MC, WL, AP ONLY): I-stat hCG, quantitative: <5 m[IU]/mL (ref <5)

## 2021-07-26 MED ORDER — SODIUM CHLORIDE 0.9% FLUSH
3.0000 mL | Freq: Once | INTRAVENOUS | Status: AC
Start: 2021-07-26 — End: 2021-07-27
  Administered 2021-07-27: 3 mL via INTRAVENOUS

## 2021-07-26 MED ORDER — LACTATED RINGERS IV BOLUS
1000.0000 mL | Freq: Once | INTRAVENOUS | Status: AC
  Administered 2021-07-27: 1000 mL via INTRAVENOUS

## 2021-07-26 MED ORDER — DIPHENHYDRAMINE HCL 50 MG/ML IJ SOLN
25.0000 mg | Freq: Once | INTRAMUSCULAR | Status: AC
  Administered 2021-07-27: 25 mg via INTRAVENOUS
  Filled 2021-07-26: qty 1

## 2021-07-26 MED ORDER — KETOROLAC TROMETHAMINE 15 MG/ML IJ SOLN
15.0000 mg | Freq: Once | INTRAMUSCULAR | Status: AC
  Administered 2021-07-27: 15 mg via INTRAVENOUS
  Filled 2021-07-26: qty 1

## 2021-07-26 MED ORDER — PROCHLORPERAZINE EDISYLATE 10 MG/2ML IJ SOLN
10.0000 mg | Freq: Once | INTRAMUSCULAR | Status: AC
  Administered 2021-07-27: 10 mg via INTRAVENOUS
  Filled 2021-07-26: qty 2

## 2021-07-26 NOTE — ED Provider Notes (Signed)
Emergency Medicine Provider Triage Evaluation Note  Deborah Pena , a 55 y.o. female  was evaluated in triage.  Pt complains of right-sided headache x2 weeks associated with intermittent slurred speech. No head injury. No history of migraines. She endorses severe right-jaw pain. 1 episode of chest pain 2 weeks ago, none since. No fever or chills. No neck stiffness  Review of Systems  Positive: Headache Negative: SOB  Physical Exam  BP (!) 134/106 (BP Location: Left Arm)   Pulse (!) 129   Temp 99.3 F (37.4 C) (Oral)   Resp (!) 21   SpO2 99%  Gen:   Awake, no distress   Resp:  Normal effort  MSK:   Moves extremities without difficulty  Other:  Appears uncomfortable  Medical Decision Making  Medically screening exam initiated at 6:48 PM.  Appropriate orders placed.  Deborah Pena was informed that the remainder of the evaluation will be completed by another provider, this initial triage assessment does not replace that evaluation, and the importance of remaining in the ED until their evaluation is complete.  Labs CT head   Deborah Pena 07/26/21 Deborah Pena    Deborah Lemming, MD 07/26/21 [phone]

## 2021-07-26 NOTE — ED Triage Notes (Signed)
Pt BIB [facility] from an urgent care, c/o headache and intermittent slurred speech x 1 week. Also reports chest pain x 2 weeks ago along with jaw pain. Denies chest pain at this time. Pt tearful in triage, reports a lot of recent stress.

## 2021-07-27 ENCOUNTER — Emergency Department (HOSPITAL_COMMUNITY): Payer: Self-pay

## 2021-07-27 MED ORDER — MAGNESIUM SULFATE IN D5W 1-5 GM/100ML-% IV SOLN
1.0000 g | Freq: Once | INTRAVENOUS | Status: AC
  Administered 2021-07-27: 1 g via INTRAVENOUS
  Filled 2021-07-27: qty 100

## 2021-07-27 MED ORDER — DEXAMETHASONE SODIUM PHOSPHATE 10 MG/ML IJ SOLN
8.0000 mg | Freq: Once | INTRAMUSCULAR | Status: AC
  Administered 2021-07-27: 8 mg via INTRAVENOUS
  Filled 2021-07-27: qty 1

## 2021-07-27 MED ORDER — METOCLOPRAMIDE HCL 5 MG/ML IJ SOLN
10.0000 mg | Freq: Once | INTRAMUSCULAR | Status: AC
  Administered 2021-07-27: 10 mg via INTRAVENOUS
  Filled 2021-07-27: qty 2

## 2021-07-27 MED ORDER — KETOROLAC TROMETHAMINE 15 MG/ML IJ SOLN
15.0000 mg | Freq: Once | INTRAMUSCULAR | Status: AC
  Administered 2021-07-27: 15 mg via INTRAVENOUS
  Filled 2021-07-27: qty 1

## 2021-07-27 MED ORDER — IOHEXOL 350 MG/ML SOLN
100.0000 mL | Freq: Once | INTRAVENOUS | Status: AC | PRN
  Administered 2021-07-27: 100 mL via INTRAVENOUS

## 2021-07-27 MED ORDER — DIPHENHYDRAMINE HCL 50 MG/ML IJ SOLN
25.0000 mg | Freq: Once | INTRAMUSCULAR | Status: AC
  Administered 2021-07-27: 25 mg via INTRAVENOUS
  Filled 2021-07-27: qty 1

## 2021-07-27 NOTE — ED Provider Notes (Signed)
Physical Exam  BP (!) 139/102   Pulse 93   Temp 99.3 F (37.4 C) (Oral)   Resp 15   SpO2 98%   Physical Exam Vitals and nursing note reviewed.  Constitutional:      Appearance: She is well-developed.  HENT:     Head: Normocephalic and atraumatic.     Mouth/Throat:     Mouth: Mucous membranes are moist.  Cardiovascular:     Rate and Rhythm: Normal rate and regular rhythm.  Pulmonary:     Effort: Pulmonary effort is normal.     Breath sounds: Normal breath sounds.  Musculoskeletal:     Cervical back: No rigidity. No pain with movement.  Skin:    General: Skin is warm and dry.  Neurological:     Mental Status: She is alert.     GCS: GCS eye subscore is 4. GCS verbal subscore is 5. GCS motor subscore is 6.     Cranial Nerves: No dysarthria.     Sensory: Sensation is intact.     Motor: Motor function is intact.     Gait: Gait is intact.  Psychiatric:        Mood and Affect: Mood normal.        Behavior: Behavior normal.    ED Course/Procedures     Procedures  Results for orders placed or performed during the hospital encounter of 07/26/21  Resp Panel by RT-PCR (Flu A&B, Covid) Nasopharyngeal Swab   Specimen: Nasopharyngeal Swab; Nasopharyngeal(NP) swabs in vial transport medium  Result Value Ref Range   SARS Coronavirus 2 by RT PCR NEGATIVE NEGATIVE   Influenza A by PCR NEGATIVE NEGATIVE   Influenza B by PCR NEGATIVE NEGATIVE  Protime-INR  Result Value Ref Range   Prothrombin Time 12.0 11.4 - 15.2 seconds   INR 0.9 0.8 - 1.2  APTT  Result Value Ref Range   aPTT 29 24 - 36 seconds  CBC  Result Value Ref Range   WBC 13.7 (H) 4.0 - 10.5 K/uL   RBC 4.63 3.87 - 5.11 MIL/uL   Hemoglobin 14.5 12.0 - 15.0 g/dL   HCT 42.7 36.0 - 46.0 %   MCV 92.2 80.0 - 100.0 fL   MCH 31.3 26.0 - 34.0 pg   MCHC 34.0 30.0 - 36.0 g/dL   RDW 12.4 11.5 - 15.5 %   Platelets 282 150 - 400 K/uL   nRBC 0.0 0.0 - 0.2 %  Differential  Result Value Ref Range   Neutrophils Relative % 74  %   Neutro Abs 10.1 (H) 1.7 - 7.7 K/uL   Lymphocytes Relative 19 %   Lymphs Abs 2.5 0.7 - 4.0 K/uL   Monocytes Relative 6 %   Monocytes Absolute 0.8 0.1 - 1.0 K/uL   Eosinophils Relative 1 %   Eosinophils Absolute 0.1 0.0 - 0.5 K/uL   Basophils Relative 0 %   Basophils Absolute 0.0 0.0 - 0.1 K/uL   Immature Granulocytes 0 %   Abs Immature Granulocytes 0.05 0.00 - 0.07 K/uL  Comprehensive metabolic panel  Result Value Ref Range   Sodium 141 135 - 145 mmol/L   Potassium 3.8 3.5 - 5.1 mmol/L   Chloride 107 98 - 111 mmol/L   CO2 19 (L) 22 - 32 mmol/L   Glucose, Bld 150 (H) 70 - 99 mg/dL   BUN 6 6 - 20 mg/dL   Creatinine, Ser 0.95 0.44 - 1.00 mg/dL   Calcium 9.4 8.9 - 10.3 mg/dL   Total Protein  7.1 6.5 - 8.1 g/dL   Albumin 3.9 3.5 - 5.0 g/dL   AST 87 (H) 15 - 41 U/L   ALT 110 (H) 0 - 44 U/L   Alkaline Phosphatase 85 38 - 126 U/L   Total Bilirubin 0.5 0.3 - 1.2 mg/dL   GFR, Estimated >60 >60 mL/min   Anion gap 15 5 - 15  I-stat chem 8, ED  Result Value Ref Range   Sodium 144 135 - 145 mmol/L   Potassium 3.8 3.5 - 5.1 mmol/L   Chloride 108 98 - 111 mmol/L   BUN 4 (L) 6 - 20 mg/dL   Creatinine, Ser 1.00 0.44 - 1.00 mg/dL   Glucose, Bld 150 (H) 70 - 99 mg/dL   Calcium, Ion 1.14 (L) 1.15 - 1.40 mmol/L   TCO2 21 (L) 22 - 32 mmol/L   Hemoglobin 14.6 12.0 - 15.0 g/dL   HCT 43.0 36.0 - 46.0 %  I-Stat beta hCG blood, ED  Result Value Ref Range   I-stat hCG, quantitative <5.0 <5 mIU/mL   Comment 3          Troponin I (High Sensitivity)  Result Value Ref Range   Troponin I (High Sensitivity) 8 <18 ng/L  Troponin I (High Sensitivity)  Result Value Ref Range   Troponin I (High Sensitivity) 7 <18 ng/L   CT Angio Head W or Wo Contrast  Result Date: 07/27/2021 CLINICAL DATA:  Thunderclap headache with right-sided neck pain a EXAM: CT ANGIOGRAPHY HEAD AND NECK TECHNIQUE: Multidetector CT imaging of the head and neck was performed using the standard protocol during bolus administration  of intravenous contrast. Multiplanar CT image reconstructions and MIPs were obtained to evaluate the vascular anatomy. Carotid stenosis measurements (when applicable) are obtained utilizing NASCET criteria, using the distal internal carotid diameter as the denominator. CONTRAST:  176m OMNIPAQUE IOHEXOL 350 MG/ML SOLN COMPARISON:  None. FINDINGS: CTA NECK FINDINGS SKELETON: There is no bony spinal canal stenosis. No lytic or blastic lesion. OTHER NECK: Normal pharynx, larynx and major salivary glands. No cervical lymphadenopathy. Unremarkable thyroid gland. UPPER CHEST: Multifocal ground-glass opacity in the lung apices. AORTIC ARCH: There is no calcific atherosclerosis of the aortic arch. There is no aneurysm, dissection or hemodynamically significant stenosis of the visualized portion of the aorta. Conventional 3 vessel aortic branching pattern. The visualized proximal subclavian arteries are widely patent. RIGHT CAROTID SYSTEM: Normal without aneurysm, dissection or stenosis. LEFT CAROTID SYSTEM: Normal without aneurysm, dissection or stenosis. VERTEBRAL ARTERIES: Left dominant configuration. Both origins are clearly patent. There is no dissection, occlusion or flow-limiting stenosis to the skull base (V1-V3 segments). CTA HEAD FINDINGS POSTERIOR CIRCULATION: --Vertebral arteries: Normal V4 segments. --Inferior cerebellar arteries: Normal. --Basilar artery: Normal. --Superior cerebellar arteries: Normal. --Posterior cerebral arteries (PCA): Normal. ANTERIOR CIRCULATION: --Intracranial internal carotid arteries: Normal. --Anterior cerebral arteries (ACA): Normal. Both A1 segments are present. Patent anterior communicating artery (a-comm). --Middle cerebral arteries (MCA): Normal. VENOUS SINUSES: As permitted by contrast timing, patent. ANATOMIC VARIANTS: None Review of the MIP images confirms the above findings. IMPRESSION: 1. No emergent large vessel occlusion or high-grade stenosis of the intracranial arteries.  2. Multifocal ground-glass opacity in the lung apices, which could indicate infection or pulmonary edema Electronically Signed   By: KUlyses JarredM.D.   On: 07/27/2021 03:38   CT HEAD WO CONTRAST  Result Date: 07/26/2021 CLINICAL DATA:  Slurred speech. Neuro deficit, acute, stroke suspected EXAM: CT HEAD WITHOUT CONTRAST TECHNIQUE: Contiguous axial images were obtained from the base of  the skull through the vertex without intravenous contrast. COMPARISON:  01/24/2021 FINDINGS: Brain: No acute intracranial abnormality. Specifically, no hemorrhage, hydrocephalus, mass lesion, acute infarction, or significant intracranial injury. Vascular: No hyperdense vessel or unexpected calcification. Skull: No acute calvarial abnormality. Sinuses/Orbits: No acute findings Other: None IMPRESSION: No acute intracranial abnormality. Electronically Signed   By: Rolm Baptise M.D.   On: 07/26/2021 19:53   CT Angio Neck W and/or Wo Contrast  Result Date: 07/27/2021 CLINICAL DATA:  Thunderclap headache with right-sided neck pain a EXAM: CT ANGIOGRAPHY HEAD AND NECK TECHNIQUE: Multidetector CT imaging of the head and neck was performed using the standard protocol during bolus administration of intravenous contrast. Multiplanar CT image reconstructions and MIPs were obtained to evaluate the vascular anatomy. Carotid stenosis measurements (when applicable) are obtained utilizing NASCET criteria, using the distal internal carotid diameter as the denominator. CONTRAST:  187m OMNIPAQUE IOHEXOL 350 MG/ML SOLN COMPARISON:  None. FINDINGS: CTA NECK FINDINGS SKELETON: There is no bony spinal canal stenosis. No lytic or blastic lesion. OTHER NECK: Normal pharynx, larynx and major salivary glands. No cervical lymphadenopathy. Unremarkable thyroid gland. UPPER CHEST: Multifocal ground-glass opacity in the lung apices. AORTIC ARCH: There is no calcific atherosclerosis of the aortic arch. There is no aneurysm, dissection or hemodynamically  significant stenosis of the visualized portion of the aorta. Conventional 3 vessel aortic branching pattern. The visualized proximal subclavian arteries are widely patent. RIGHT CAROTID SYSTEM: Normal without aneurysm, dissection or stenosis. LEFT CAROTID SYSTEM: Normal without aneurysm, dissection or stenosis. VERTEBRAL ARTERIES: Left dominant configuration. Both origins are clearly patent. There is no dissection, occlusion or flow-limiting stenosis to the skull base (V1-V3 segments). CTA HEAD FINDINGS POSTERIOR CIRCULATION: --Vertebral arteries: Normal V4 segments. --Inferior cerebellar arteries: Normal. --Basilar artery: Normal. --Superior cerebellar arteries: Normal. --Posterior cerebral arteries (PCA): Normal. ANTERIOR CIRCULATION: --Intracranial internal carotid arteries: Normal. --Anterior cerebral arteries (ACA): Normal. Both A1 segments are present. Patent anterior communicating artery (a-comm). --Middle cerebral arteries (MCA): Normal. VENOUS SINUSES: As permitted by contrast timing, patent. ANATOMIC VARIANTS: None Review of the MIP images confirms the above findings. IMPRESSION: 1. No emergent large vessel occlusion or high-grade stenosis of the intracranial arteries. 2. Multifocal ground-glass opacity in the lung apices, which could indicate infection or pulmonary edema Electronically Signed   By: KUlyses JarredM.D.   On: 07/27/2021 03:38   DG Chest Portable 1 View  Result Date: 07/26/2021 CLINICAL DATA:  Chest pain. EXAM: PORTABLE CHEST 1 VIEW COMPARISON:  11/21/2018 FINDINGS: The heart size and mediastinal contours are within normal limits. Both lungs are clear. The visualized skeletal structures are unremarkable. IMPRESSION: No active disease. Electronically Signed   By: ENolon NationsM.D.   On: 07/26/2021 19:25    MDM   6:15 AM Patient signed out to me by previous coming physician. 55year old female presenting for complaints of headache. On exam patient is alert and oriented x3, no acute  distress, afebrile, stable vital signs.  No neurological deficits.  Normal gait. CT head demonstrates no acute process.  No subarachnoid hemorrhage.  No neck rigidity, neck stiffness, fevers, or history of URI symptoms.  Low suspicion meningitis.  Reglan, Benadryl, Toradol given for her headache management.  9:58 AM Called to bedside, patient requesting discharge at this time. Admits to improvement of headache symptoms.  Recommendations given for close return precautions if headache worsens, does not alleviate, or for development of neurological dysfunction.  Patient recommended for rest, increase oral hydration, and Motrin/Tylenol for headache. All questions answered prior to discharge.  Campbell Stall P, DO [id number]

## 2021-07-27 NOTE — ED Notes (Signed)
Patient transported to CT 

## 2021-07-27 NOTE — ED Provider Notes (Addendum)
[name] EMERGENCY DEPARTMENT Provider Note   CSN: [id number] Arrival date & time: 07/26/21  1847     History Chief Complaint  Patient presents with   Headache    Deborah Pena is a 55 y.o. female with PMH anemia, diverticulitis, GERD, asthma who presents to the emergency department as a transfer from urgent care due to concern for severe headache, right-sided neck pain, difficulty swallowing.  She states that she has had a headache for the last 2 weeks that has been progressively worsening with associated right-sided neck pain and jaw pain.  She states of the last 3 days she has had difficulty swallowing due to significant sore throat pain.  She endorses dizziness and lightheadedness, but denies nausea, vomiting, chest pain, shortness of breath, numbness, tingling or other systemic symptoms.   Headache Associated symptoms: ear pain and sore throat   Associated symptoms: no abdominal pain, no back pain, no cough, no eye pain, no fever, no seizures and no vomiting       Past Medical History:  Diagnosis Date   Anemia    Anginal pain ([place])    Anxiety    Arthritis    "left hand" (04/26/2016)   Asthma    Chronic lower back pain    Diverticulitis of colon    GERD (gastroesophageal reflux disease)    Headache    'once or twice/week" (04/26/2016)   Migraine    "twice/month" (04/26/2016)   Stomach ulcer     Patient Active Problem List   Diagnosis Date Noted   Localized edema 04/16/2019   Acute cholecystitis 06/29/2018   Cholecystitis 06/29/2018   Schizoaffective disorder ([place]) 10/10/2017   Hyperprolactinemia ([place]) 08/16/2017   ADD (attention deficit disorder) 08/14/2017   Alcohol abuse 03/13/2017   PTSD (post-traumatic stress disorder) 01/16/2017   Panic disorder 01/16/2017   Acute diverticulitis 04/26/2016   Diverticulitis of large intestine with abscess without bleeding 04/26/2016   Intractable migraine 07/27/2014   Insomnia 05/26/2013    Past Surgical  History:  Procedure Laterality Date   ABDOMINAL HYSTERECTOMY  2013   preformed at [facility] by Dr Jacqualyn Posey in April   [place] Bilateral ~ 2000   "dual lateral duct removal"    BUNIONECTOMY Right 2013   [facility]   CHOLECYSTECTOMY  06/29/2018   CHOLECYSTECTOMY N/A 06/29/2018   Procedure: LAPAROSCOPIC CHOLECYSTECTOMY WITH INTRAOPERATIVE CHOLANGIOGRAM;  Surgeon: Alphonsa Overall, MD;  Location: [place];  Service: General;  Laterality: N/A;   OOPHORECTOMY Right 2013   TONSILLECTOMY       OB History   No obstetric history on file.     Family History  Problem Relation Age of Onset   Heart failure Father    Bipolar disorder Father    Schizophrenia Father    Heart attack Father    Hypertension Mother    Diabetes Other    Breast cancer Other    Diverticulitis Other    Mental illness Paternal Grandmother    Cancer Paternal Grandmother    Mental illness Paternal Grandfather    Schizophrenia Paternal Aunt    Cancer Paternal Aunt        breast cancer   Suicidality Maternal Aunt    Asthma Brother     Social History   Tobacco Use   Smoking status: Former    Packs/day: 2.00    Years: 30.00    Pack years: 60.00    Types: Cigarettes    Quit date: 12/20/2009    Years since  quitting: 11.6   Smokeless tobacco: Former    Types: Chew   Tobacco comments:    "quit smoking cigarettes in ~ 2011; chewed when I was little"  Vaping Use   Vaping Use: Some days  Substance Use Topics   Alcohol use: Yes    Alcohol/week: 1.0 standard drink    Types: 1 Glasses of wine per week    Comment: every other days with dinner   Drug use: No    Home Medications Prior to Admission medications   Medication Sig Start Date End Date Taking? Authorizing Provider  albuterol (PROVENTIL HFA;VENTOLIN HFA) 108 (90 Base) MCG/ACT inhaler Inhale 2 puffs into the lungs every 6 (six) hours as needed for wheezing or shortness of breath. 10/14/17   Lindell Spar I, NP  ALPRAZolam Duanne Moron) 1 MG tablet  Take 1 mg by mouth 2 (two) times daily. 01/18/21   [provider]  amantadine (SYMMETREL) 100 MG capsule  12/18/20   [provider]  amLODipine (NORVASC) 5 MG tablet  04/13/19   [provider]  amphetamine-dextroamphetamine (ADDERALL) 20 MG tablet Take 20 mg by mouth 3 (three) times daily. 01/18/21   [provider]  cariprazine (VRAYLAR) capsule Take 1 capsule (3 mg total) by mouth daily. 12/07/18   Norman Clay, MD  gabapentin (NEURONTIN) 600 MG tablet Take 600 mg by mouth 4 (four) times daily. 01/22/21   [provider]  hydrOXYzine (VISTARIL) 25 MG capsule Take by mouth. 12/31/20   [provider]  LORazepam (ATIVAN) 2 MG tablet  03/21/19   [provider]  Multiple Vitamin (MULTIVITAMIN WITH MINERALS) TABS tablet Take 1 tablet by mouth daily. Vitamin supplement 10/14/17   Lindell Spar I, NP  pantoprazole (PROTONIX) 40 MG tablet Take 1 tablet (40 mg total) by mouth daily. 11/28/18   Thornton [place], MD  propranolol (INDERAL) 20 MG tablet  04/13/19   [provider]  QUEtiapine (SEROQUEL) 200 MG tablet Take 200 mg by mouth at bedtime. 01/14/21   [provider]  sertraline (ZOLOFT) 100 MG tablet Take 2 tablets (200 mg total) by mouth daily. For depression 12/07/18   Norman Clay, MD  VYVANSE 20 MG capsule  02/22/19   [provider]  ziprasidone (GEODON) 80 MG capsule Take by mouth. 10/16/20   [provider]  zolpidem (AMBIEN) 10 MG tablet Take 10 mg by mouth at bedtime. 01/18/21   [provider]    Allergies    Lamictal [lamotrigine], Lithium, Buprenorphine hcl, Ciprofloxacin, and Propranolol  Review of Systems   Review of Systems  Constitutional:  Negative for chills and fever.  HENT:  Positive for ear pain and sore throat.   Eyes:  Negative for pain and visual disturbance.  Respiratory:  Negative for cough and shortness of breath.   Cardiovascular:  Negative for chest pain and  palpitations.  Gastrointestinal:  Negative for abdominal pain and vomiting.  Genitourinary:  Negative for dysuria and hematuria.  Musculoskeletal:  Negative for arthralgias and back pain.  Skin:  Negative for color change and rash.  Neurological:  Positive for light-headedness and headaches. Negative for seizures and syncope.  All other systems reviewed and are negative.  Physical Exam Updated Vital Signs BP 140/81   Pulse 92   Temp 99.3 F (37.4 C) (Oral)   Resp 17   SpO2 97%   Physical Exam Vitals and nursing note reviewed.  Constitutional:      General: She is not in acute distress.    Appearance:  She is well-developed. She is ill-appearing.  HENT:     Head: Normocephalic and atraumatic.     Mouth/Throat:     Comments: Oropharyngeal erythema with no tonsillar swelling or exudate Eyes:     Conjunctiva/sclera: Conjunctivae normal.  Neck:     Comments: Submandibular tenderness on the right Cardiovascular:     Rate and Rhythm: Regular rhythm. Tachycardia present.     Heart sounds: No murmur heard. Pulmonary:     Effort: Pulmonary effort is normal. No respiratory distress.     Breath sounds: Normal breath sounds.  Abdominal:     Palpations: Abdomen is soft.     Tenderness: There is no abdominal tenderness.  Musculoskeletal:     Cervical back: Neck supple. No rigidity.  Skin:    General: Skin is warm and dry.  Neurological:     Mental Status: She is alert and oriented to person, place, and time.     GCS: GCS eye subscore is 4. GCS verbal subscore is 5. GCS motor subscore is 6.     Cranial Nerves: No cranial nerve deficit, dysarthria or facial asymmetry.    ED Results / Procedures / Treatments   Labs (all labs ordered are listed, but only abnormal results are displayed) Labs Reviewed  CBC - Abnormal; Notable for the following components:      Result Value   WBC 13.7 (*)    All other components within normal limits  DIFFERENTIAL - Abnormal; Notable for the  following components:   Neutro Abs 10.1 (*)    All other components within normal limits  COMPREHENSIVE METABOLIC PANEL - Abnormal; Notable for the following components:   CO2 19 (*)    Glucose, Bld 150 (*)    AST 87 (*)    ALT 110 (*)    All other components within normal limits  I-STAT CHEM 8, ED - Abnormal; Notable for the following components:   BUN 4 (*)    Glucose, Bld 150 (*)    Calcium, Ion 1.14 (*)    TCO2 21 (*)    All other components within normal limits  RESP PANEL BY RT-PCR (FLU A&B, COVID) ARPGX2  PROTIME-INR  APTT  CBG MONITORING, ED  I-STAT BETA HCG BLOOD, ED ([facility], [facility], AP ONLY)  TROPONIN I (HIGH SENSITIVITY)  TROPONIN I (HIGH SENSITIVITY)    EKG EKG Interpretation  Date/Time:  Sunday July 26 2021 18:52:10 EDT Ventricular Rate:  136 PR Interval:  154 QRS Duration: 84 QT Interval:  282 QTC Calculation: 424 R Axis:   34 Text Interpretation: Sinus tachycardia Possible Anterior infarct , age undetermined Abnormal ECG When compared with ECG of 01/24/2021, HEART RATE has increased Confirmed by Delora Fuel ([id number]) on 07/26/2021 11:08:35 PM  Radiology CT Angio Head W or Wo Contrast  Result Date: 07/27/2021 CLINICAL DATA:  Thunderclap headache with right-sided neck pain a EXAM: CT ANGIOGRAPHY HEAD AND NECK TECHNIQUE: Multidetector CT imaging of the head and neck was performed using the standard protocol during bolus administration of intravenous contrast. Multiplanar CT image reconstructions and MIPs were obtained to evaluate the vascular anatomy. Carotid stenosis measurements (when applicable) are obtained utilizing NASCET criteria, using the distal internal carotid diameter as the denominator. CONTRAST:  16m OMNIPAQUE IOHEXOL 350 MG/ML SOLN COMPARISON:  None. FINDINGS: CTA NECK FINDINGS SKELETON: There is no bony spinal canal stenosis. No lytic or blastic lesion. OTHER NECK: Normal pharynx, larynx and major salivary glands. No cervical lymphadenopathy. Unremarkable  thyroid gland. UPPER CHEST: Multifocal ground-glass opacity in  the lung apices. AORTIC ARCH: There is no calcific atherosclerosis of the aortic arch. There is no aneurysm, dissection or hemodynamically significant stenosis of the visualized portion of the aorta. Conventional 3 vessel aortic branching pattern. The visualized proximal subclavian arteries are widely patent. RIGHT CAROTID SYSTEM: Normal without aneurysm, dissection or stenosis. LEFT CAROTID SYSTEM: Normal without aneurysm, dissection or stenosis. VERTEBRAL ARTERIES: Left dominant configuration. Both origins are clearly patent. There is no dissection, occlusion or flow-limiting stenosis to the skull base (V1-V3 segments). CTA HEAD FINDINGS POSTERIOR CIRCULATION: --Vertebral arteries: Normal V4 segments. --Inferior cerebellar arteries: Normal. --Basilar artery: Normal. --Superior cerebellar arteries: Normal. --Posterior cerebral arteries (PCA): Normal. ANTERIOR CIRCULATION: --Intracranial internal carotid arteries: Normal. --Anterior cerebral arteries (ACA): Normal. Both A1 segments are present. Patent anterior communicating artery (a-comm). --Middle cerebral arteries (MCA): Normal. VENOUS SINUSES: As permitted by contrast timing, patent. ANATOMIC VARIANTS: None Review of the MIP images confirms the above findings. IMPRESSION: 1. No emergent large vessel occlusion or high-grade stenosis of the intracranial arteries. 2. Multifocal ground-glass opacity in the lung apices, which could indicate infection or pulmonary edema Electronically Signed   By: Ulyses Jarred M.D.   On: 07/27/2021 03:38   CT HEAD WO CONTRAST  Result Date: 07/26/2021 CLINICAL DATA:  Slurred speech. Neuro deficit, acute, stroke suspected EXAM: CT HEAD WITHOUT CONTRAST TECHNIQUE: Contiguous axial images were obtained from the base of the skull through the vertex without intravenous contrast. COMPARISON:  01/24/2021 FINDINGS: Brain: No acute intracranial abnormality. Specifically, no  hemorrhage, hydrocephalus, mass lesion, acute infarction, or significant intracranial injury. Vascular: No hyperdense vessel or unexpected calcification. Skull: No acute calvarial abnormality. Sinuses/Orbits: No acute findings Other: None IMPRESSION: No acute intracranial abnormality. Electronically Signed   By: Rolm Baptise M.D.   On: 07/26/2021 19:53   CT Angio Neck W and/or Wo Contrast  Result Date: 07/27/2021 CLINICAL DATA:  Thunderclap headache with right-sided neck pain a EXAM: CT ANGIOGRAPHY HEAD AND NECK TECHNIQUE: Multidetector CT imaging of the head and neck was performed using the standard protocol during bolus administration of intravenous contrast. Multiplanar CT image reconstructions and MIPs were obtained to evaluate the vascular anatomy. Carotid stenosis measurements (when applicable) are obtained utilizing NASCET criteria, using the distal internal carotid diameter as the denominator. CONTRAST:  163m OMNIPAQUE IOHEXOL 350 MG/ML SOLN COMPARISON:  None. FINDINGS: CTA NECK FINDINGS SKELETON: There is no bony spinal canal stenosis. No lytic or blastic lesion. OTHER NECK: Normal pharynx, larynx and major salivary glands. No cervical lymphadenopathy. Unremarkable thyroid gland. UPPER CHEST: Multifocal ground-glass opacity in the lung apices. AORTIC ARCH: There is no calcific atherosclerosis of the aortic arch. There is no aneurysm, dissection or hemodynamically significant stenosis of the visualized portion of the aorta. Conventional 3 vessel aortic branching pattern. The visualized proximal subclavian arteries are widely patent. RIGHT CAROTID SYSTEM: Normal without aneurysm, dissection or stenosis. LEFT CAROTID SYSTEM: Normal without aneurysm, dissection or stenosis. VERTEBRAL ARTERIES: Left dominant configuration. Both origins are clearly patent. There is no dissection, occlusion or flow-limiting stenosis to the skull base (V1-V3 segments). CTA HEAD FINDINGS POSTERIOR CIRCULATION: --Vertebral  arteries: Normal V4 segments. --Inferior cerebellar arteries: Normal. --Basilar artery: Normal. --Superior cerebellar arteries: Normal. --Posterior cerebral arteries (PCA): Normal. ANTERIOR CIRCULATION: --Intracranial internal carotid arteries: Normal. --Anterior cerebral arteries (ACA): Normal. Both A1 segments are present. Patent anterior communicating artery (a-comm). --Middle cerebral arteries (MCA): Normal. VENOUS SINUSES: As permitted by contrast timing, patent. ANATOMIC VARIANTS: None Review of the MIP images confirms the above findings. IMPRESSION: 1. No emergent large  vessel occlusion or high-grade stenosis of the intracranial arteries. 2. Multifocal ground-glass opacity in the lung apices, which could indicate infection or pulmonary edema Electronically Signed   By: Ulyses Jarred M.D.   On: 07/27/2021 03:38   DG Chest Portable 1 View  Result Date: 07/26/2021 CLINICAL DATA:  Chest pain. EXAM: PORTABLE CHEST 1 VIEW COMPARISON:  11/21/2018 FINDINGS: The heart size and mediastinal contours are within normal limits. Both lungs are clear. The visualized skeletal structures are unremarkable. IMPRESSION: No active disease. Electronically Signed   By: Nolon Nations M.D.   On: 07/26/2021 19:25    Procedures Procedures   Medications Ordered in ED Medications  magnesium sulfate IVPB 1 g 100 mL (1 g Intravenous New Bag/Given 07/27/21 0557)  metoCLOPramide (REGLAN) injection 10 mg (has no administration in time range)  diphenhydrAMINE (BENADRYL) injection 25 mg (has no administration in time range)  ketorolac (TORADOL) 15 MG/ML injection 15 mg (has no administration in time range)  sodium chloride flush (NS) 0.9 % injection 3 mL (3 mLs Intravenous Given 07/27/21 0342)  prochlorperazine (COMPAZINE) injection 10 mg (10 mg Intravenous Given 07/27/21 0138)  diphenhydrAMINE (BENADRYL) injection 25 mg (25 mg Intravenous Given 07/27/21 0137)  ketorolac (TORADOL) 15 MG/ML injection 15 mg (15 mg Intravenous Given  07/27/21 0137)  lactated ringers bolus 1,000 mL (0 mLs Intravenous Stopped 07/27/21 0344)  dexamethasone (DECADRON) injection 8 mg (8 mg Intravenous Given 07/27/21 0341)  iohexol (OMNIPAQUE) 350 MG/ML injection 100 mL (100 mLs Intravenous Contrast Given 07/27/21 0329)    ED Course  I have reviewed the triage vital signs and the nursing notes.  Pertinent labs & imaging results that were available during my care of the patient were reviewed by me and considered in my medical decision making (see chart for details).    MDM Rules/Calculators/A&P                           Patient seen in the emergency department for evaluation of multiple complaints as described above.  Physical exam reveals some mild tenderness under the mandible on the right but physical exam is otherwise unremarkable.  No focal motor or sensory deficits.  Laboratory evaluation reveals a mild leukocytosis to 13.7 but is otherwise unremarkable outside of a mild transaminitis with AST 87, ALT 110.  Initial CT head unremarkable.  Patient given a headache cocktail with Compazine Benadryl Toradol and lactated Ringer's which improved the patient's headache from a 10 out of 10 to a 7 out of 10 but due to persistent symptoms, patient also received steroids and magnesium.  She received a CTA of the brain and neck to rule out aneurysmal pathology or carotid dissection which were reassuringly negative.  The patient slept for multiple hours here in the emergency department and on reevaluation states that she feels that her headache is coming back.  We will administer an additional headache cocktail and the patient was signed out to oncoming provider.  Please see provider signout note for continuation of work-up.  If symptoms truly do persist, consider MRI MRV for dural venous sinus thrombosis or admission for intractable headache.  Final Clinical Impression(s) / ED Diagnoses Final diagnoses:  None    Rx / DC Orders ED Discharge Orders     None         Jacques Fife, Debe Coder, MD 07/27/21 [place], [place], MD 07/27/21 [phone]
